# Patient Record
Sex: Female | Born: 1970 | Race: Asian | Hispanic: Yes | Smoking: Never smoker
Health system: Southern US, Community
[De-identification: ages and names within clinical notes are randomized; demographics above are authoritative.]

## PROBLEM LIST (undated history)

## (undated) DIAGNOSIS — M7580 Other shoulder lesions, unspecified shoulder: Secondary | ICD-10-CM

## (undated) DIAGNOSIS — F329 Major depressive disorder, single episode, unspecified: Secondary | ICD-10-CM

## (undated) DIAGNOSIS — G4730 Sleep apnea, unspecified: Secondary | ICD-10-CM

## (undated) DIAGNOSIS — K5090 Crohn's disease, unspecified, without complications: Secondary | ICD-10-CM

## (undated) DIAGNOSIS — F319 Bipolar disorder, unspecified: Secondary | ICD-10-CM

## (undated) DIAGNOSIS — G44009 Cluster headache syndrome, unspecified, not intractable: Secondary | ICD-10-CM

## (undated) DIAGNOSIS — E119 Type 2 diabetes mellitus without complications: Secondary | ICD-10-CM

## (undated) DIAGNOSIS — G7111 Myotonic muscular dystrophy: Secondary | ICD-10-CM

## (undated) DIAGNOSIS — M7520 Bicipital tendinitis, unspecified shoulder: Secondary | ICD-10-CM

## (undated) DIAGNOSIS — F909 Attention-deficit hyperactivity disorder, unspecified type: Secondary | ICD-10-CM

## (undated) DIAGNOSIS — M51369 Other intervertebral disc degeneration, lumbar region without mention of lumbar back pain or lower extremity pain: Secondary | ICD-10-CM

## (undated) DIAGNOSIS — M48061 Spinal stenosis, lumbar region without neurogenic claudication: Secondary | ICD-10-CM

## (undated) DIAGNOSIS — F32A Depression, unspecified: Secondary | ICD-10-CM

## (undated) DIAGNOSIS — Q851 Tuberous sclerosis: Secondary | ICD-10-CM

## (undated) DIAGNOSIS — G8929 Other chronic pain: Secondary | ICD-10-CM

## (undated) DIAGNOSIS — M549 Dorsalgia, unspecified: Secondary | ICD-10-CM

## (undated) DIAGNOSIS — E875 Hyperkalemia: Secondary | ICD-10-CM

## (undated) DIAGNOSIS — E282 Polycystic ovarian syndrome: Secondary | ICD-10-CM

## (undated) DIAGNOSIS — E7800 Pure hypercholesterolemia, unspecified: Secondary | ICD-10-CM

## (undated) DIAGNOSIS — M5136 Other intervertebral disc degeneration, lumbar region: Secondary | ICD-10-CM

## (undated) DIAGNOSIS — I10 Essential (primary) hypertension: Secondary | ICD-10-CM

## (undated) HISTORY — DX: Other chronic pain: G89.29

## (undated) HISTORY — DX: Other shoulder lesions, unspecified shoulder: M75.80

## (undated) HISTORY — DX: Bipolar disorder, unspecified: F31.9

## (undated) HISTORY — DX: Essential (primary) hypertension: I10

## (undated) HISTORY — DX: Polycystic ovarian syndrome: E28.2

## (undated) HISTORY — DX: Other intervertebral disc degeneration, lumbar region: M51.36

## (undated) HISTORY — DX: Other intervertebral disc degeneration, lumbar region without mention of lumbar back pain or lower extremity pain: M51.369

## (undated) HISTORY — DX: Type 2 diabetes mellitus without complications: E11.9

## (undated) HISTORY — DX: Cluster headache syndrome, unspecified, not intractable: G44.009

## (undated) HISTORY — DX: Attention-deficit hyperactivity disorder, unspecified type: F90.9

## (undated) HISTORY — DX: Major depressive disorder, single episode, unspecified: F32.9

## (undated) HISTORY — DX: Sleep apnea, unspecified: G47.30

## (undated) HISTORY — PX: SHOULDER SURGERY: SHX246

## (undated) HISTORY — PX: OTHER SURGICAL HISTORY: SHX169

## (undated) HISTORY — DX: Bicipital tendinitis, unspecified shoulder: M75.20

## (undated) HISTORY — DX: Pure hypercholesterolemia, unspecified: E78.00

## (undated) HISTORY — DX: Depression, unspecified: F32.A

## (undated) HISTORY — DX: Tuberous sclerosis: Q85.1

## (undated) HISTORY — DX: Crohn's disease, unspecified, without complications: K50.90

## (undated) HISTORY — PX: CATARACT EXTRACTION, BILATERAL: SHX1313

## (undated) HISTORY — DX: Dorsalgia, unspecified: M54.9

---

## 2006-06-01 ENCOUNTER — Ambulatory Visit: Payer: Self-pay | Admitting: Internal Medicine

## 2006-06-16 DIAGNOSIS — J301 Allergic rhinitis due to pollen: Secondary | ICD-10-CM | POA: Insufficient documentation

## 2006-06-16 DIAGNOSIS — E782 Mixed hyperlipidemia: Secondary | ICD-10-CM | POA: Insufficient documentation

## 2006-06-16 DIAGNOSIS — M545 Low back pain, unspecified: Secondary | ICD-10-CM | POA: Insufficient documentation

## 2007-09-06 ENCOUNTER — Ambulatory Visit: Payer: Self-pay | Admitting: Pain Medicine

## 2007-09-26 ENCOUNTER — Ambulatory Visit: Payer: Self-pay | Admitting: Pain Medicine

## 2007-09-27 ENCOUNTER — Ambulatory Visit: Payer: Self-pay | Admitting: Pain Medicine

## 2007-11-02 ENCOUNTER — Ambulatory Visit: Payer: Self-pay | Admitting: Pain Medicine

## 2007-11-28 ENCOUNTER — Ambulatory Visit: Payer: Self-pay | Admitting: Pain Medicine

## 2007-12-20 ENCOUNTER — Ambulatory Visit: Payer: Self-pay | Admitting: Pain Medicine

## 2008-01-04 ENCOUNTER — Ambulatory Visit: Payer: Self-pay | Admitting: Pain Medicine

## 2008-02-14 ENCOUNTER — Ambulatory Visit: Payer: Self-pay | Admitting: Pain Medicine

## 2008-02-29 ENCOUNTER — Ambulatory Visit: Payer: Self-pay | Admitting: Pain Medicine

## 2008-03-22 ENCOUNTER — Ambulatory Visit: Payer: Self-pay | Admitting: Pain Medicine

## 2008-04-16 ENCOUNTER — Ambulatory Visit: Payer: Self-pay | Admitting: Pain Medicine

## 2008-05-17 ENCOUNTER — Ambulatory Visit: Payer: Self-pay | Admitting: Pain Medicine

## 2008-06-06 ENCOUNTER — Ambulatory Visit: Payer: Self-pay | Admitting: Pain Medicine

## 2008-08-21 ENCOUNTER — Ambulatory Visit: Payer: Self-pay | Admitting: Pain Medicine

## 2008-09-10 ENCOUNTER — Ambulatory Visit: Payer: Self-pay | Admitting: Pain Medicine

## 2008-10-02 ENCOUNTER — Ambulatory Visit: Payer: Self-pay | Admitting: Pain Medicine

## 2008-10-22 ENCOUNTER — Ambulatory Visit: Payer: Self-pay | Admitting: Pain Medicine

## 2008-12-27 ENCOUNTER — Ambulatory Visit: Payer: Self-pay | Admitting: Pain Medicine

## 2010-09-05 ENCOUNTER — Ambulatory Visit: Payer: Self-pay

## 2010-09-09 ENCOUNTER — Ambulatory Visit: Payer: Self-pay

## 2010-09-10 LAB — PATHOLOGY REPORT

## 2011-01-04 HISTORY — PX: ENDOMETRIAL ABLATION: SHX621

## 2011-01-23 ENCOUNTER — Ambulatory Visit: Payer: Self-pay

## 2011-01-27 ENCOUNTER — Ambulatory Visit: Payer: Self-pay

## 2011-01-28 LAB — PATHOLOGY REPORT

## 2011-06-23 ENCOUNTER — Ambulatory Visit: Payer: Self-pay | Admitting: Pain Medicine

## 2011-07-05 ENCOUNTER — Ambulatory Visit: Payer: Self-pay | Admitting: Pain Medicine

## 2011-07-16 ENCOUNTER — Ambulatory Visit: Payer: Self-pay | Admitting: Pain Medicine

## 2012-02-11 ENCOUNTER — Ambulatory Visit: Payer: Self-pay | Admitting: Pain Medicine

## 2012-03-25 ENCOUNTER — Ambulatory Visit: Payer: Self-pay | Admitting: Internal Medicine

## 2012-04-28 ENCOUNTER — Emergency Department: Payer: Self-pay | Admitting: Emergency Medicine

## 2012-04-28 ENCOUNTER — Ambulatory Visit: Payer: Self-pay | Admitting: Pain Medicine

## 2012-05-26 ENCOUNTER — Ambulatory Visit: Payer: Self-pay | Admitting: Pain Medicine

## 2012-06-27 ENCOUNTER — Ambulatory Visit: Payer: Self-pay | Admitting: Pain Medicine

## 2012-09-13 ENCOUNTER — Ambulatory Visit: Payer: Self-pay | Admitting: Pain Medicine

## 2012-09-29 ENCOUNTER — Ambulatory Visit: Payer: Self-pay | Admitting: Pain Medicine

## 2012-11-29 ENCOUNTER — Ambulatory Visit: Payer: Self-pay | Admitting: Pain Medicine

## 2012-12-28 ENCOUNTER — Ambulatory Visit: Payer: Self-pay | Admitting: Pain Medicine

## 2012-12-29 ENCOUNTER — Ambulatory Visit: Payer: Self-pay | Admitting: Pain Medicine

## 2013-01-26 ENCOUNTER — Ambulatory Visit: Payer: Self-pay | Admitting: Pain Medicine

## 2013-02-23 ENCOUNTER — Ambulatory Visit: Payer: Self-pay | Admitting: Pain Medicine

## 2013-03-29 ENCOUNTER — Ambulatory Visit: Payer: Self-pay | Admitting: Pain Medicine

## 2013-03-31 ENCOUNTER — Ambulatory Visit: Payer: Self-pay | Admitting: Internal Medicine

## 2013-04-27 ENCOUNTER — Ambulatory Visit: Payer: Self-pay | Admitting: Pain Medicine

## 2013-05-22 ENCOUNTER — Ambulatory Visit: Payer: Self-pay | Admitting: Pain Medicine

## 2013-06-22 ENCOUNTER — Ambulatory Visit: Payer: Self-pay | Admitting: Pain Medicine

## 2013-08-08 ENCOUNTER — Ambulatory Visit: Payer: Self-pay | Admitting: Pain Medicine

## 2013-09-05 ENCOUNTER — Ambulatory Visit: Payer: Self-pay | Admitting: Pain Medicine

## 2013-09-07 ENCOUNTER — Emergency Department: Payer: Self-pay | Admitting: Emergency Medicine

## 2013-09-07 LAB — BASIC METABOLIC PANEL
Anion Gap: 6 — ABNORMAL LOW (ref 7–16)
BUN: 21 mg/dL — ABNORMAL HIGH (ref 7–18)
CREATININE: 0.75 mg/dL (ref 0.60–1.30)
Calcium, Total: 9.2 mg/dL (ref 8.5–10.1)
Chloride: 107 mmol/L (ref 98–107)
Co2: 25 mmol/L (ref 21–32)
EGFR (African American): >60
EGFR (Non-African Amer.): >60
Glucose: 109 mg/dL — ABNORMAL HIGH (ref 65–99)
Osmolality: 279 (ref 275–301)
Potassium: 3.6 mmol/L (ref 3.5–5.1)
Sodium: 138 mmol/L (ref 136–145)

## 2013-09-07 LAB — CBC WITH DIFFERENTIAL/PLATELET
BASOS PCT: 0.8 %
Basophil #: 0.1 10*3/uL (ref 0.0–0.1)
EOS ABS: 0.2 10*3/uL (ref 0.0–0.7)
EOS PCT: 3.1 %
HCT: 41 % (ref 35.0–47.0)
HGB: 13.5 g/dL (ref 12.0–16.0)
LYMPHS ABS: 2.1 10*3/uL (ref 1.0–3.6)
LYMPHS PCT: 25.8 %
MCH: 28.7 pg (ref 26.0–34.0)
MCHC: 32.9 g/dL (ref 32.0–36.0)
MCV: 87 fL (ref 80–100)
Monocyte #: 0.8 x10 3/mm (ref 0.2–0.9)
Monocyte %: 9.5 %
NEUTROS PCT: 60.8 %
Neutrophil #: 4.9 10*3/uL (ref 1.4–6.5)
Platelet: 272 10*3/uL (ref 150–440)
RBC: 4.7 10*6/uL (ref 3.80–5.20)
RDW: 15.8 % — AB (ref 11.5–14.5)
WBC: 8 10*3/uL (ref 3.6–11.0)

## 2013-09-07 LAB — TROPONIN I

## 2013-09-28 ENCOUNTER — Ambulatory Visit: Payer: Self-pay | Admitting: Pain Medicine

## 2013-10-05 DIAGNOSIS — F411 Generalized anxiety disorder: Secondary | ICD-10-CM | POA: Insufficient documentation

## 2013-10-05 DIAGNOSIS — M25819 Other specified joint disorders, unspecified shoulder: Secondary | ICD-10-CM | POA: Insufficient documentation

## 2013-10-05 DIAGNOSIS — R5381 Other malaise: Secondary | ICD-10-CM | POA: Insufficient documentation

## 2013-10-05 DIAGNOSIS — M7580 Other shoulder lesions, unspecified shoulder: Secondary | ICD-10-CM

## 2013-10-25 ENCOUNTER — Ambulatory Visit: Payer: Self-pay | Admitting: Pain Medicine

## 2013-11-08 ENCOUNTER — Ambulatory Visit: Payer: Self-pay | Admitting: Pain Medicine

## 2013-12-05 ENCOUNTER — Ambulatory Visit: Payer: Self-pay | Admitting: Pain Medicine

## 2014-01-02 ENCOUNTER — Ambulatory Visit: Payer: Self-pay | Admitting: Pain Medicine

## 2014-01-31 ENCOUNTER — Ambulatory Visit: Payer: Self-pay | Admitting: Pain Medicine

## 2014-03-01 ENCOUNTER — Ambulatory Visit: Payer: Self-pay | Admitting: Pain Medicine

## 2014-04-03 ENCOUNTER — Ambulatory Visit: Payer: Self-pay | Admitting: Pain Medicine

## 2014-05-03 ENCOUNTER — Ambulatory Visit: Payer: Self-pay | Admitting: Pain Medicine

## 2014-06-04 ENCOUNTER — Ambulatory Visit: Payer: Self-pay | Admitting: Pain Medicine

## 2014-07-10 ENCOUNTER — Ambulatory Visit: Payer: Self-pay | Admitting: Pain Medicine

## 2014-08-06 ENCOUNTER — Ambulatory Visit: Payer: Self-pay | Admitting: Pain Medicine

## 2014-09-11 ENCOUNTER — Ambulatory Visit: Payer: Self-pay | Admitting: Pain Medicine

## 2014-10-08 ENCOUNTER — Ambulatory Visit
Admit: 2014-10-08 | Disposition: A | Discharge status: Home / Self Care | Payer: Self-pay | Attending: Pain Medicine | Admitting: Pain Medicine

## 2014-11-06 ENCOUNTER — Ambulatory Visit: Attending: Pain Medicine | Admitting: Pain Medicine

## 2014-11-06 ENCOUNTER — Encounter (INDEPENDENT_AMBULATORY_CARE_PROVIDER_SITE_OTHER): Payer: Self-pay

## 2014-11-06 ENCOUNTER — Encounter: Payer: Self-pay | Admitting: Pain Medicine

## 2014-11-06 VITALS — BP 122/80 | HR 97 | Temp 98.2°F | Resp 19 | Ht 68.0 in | Wt 238.0 lb

## 2014-11-06 DIAGNOSIS — M5030 Other cervical disc degeneration, unspecified cervical region: Secondary | ICD-10-CM

## 2014-11-06 DIAGNOSIS — M47892 Other spondylosis, cervical region: Secondary | ICD-10-CM | POA: Diagnosis not present

## 2014-11-06 DIAGNOSIS — S43429A Sprain of unspecified rotator cuff capsule, initial encounter: Secondary | ICD-10-CM | POA: Insufficient documentation

## 2014-11-06 DIAGNOSIS — M62838 Other muscle spasm: Secondary | ICD-10-CM | POA: Diagnosis not present

## 2014-11-06 DIAGNOSIS — M25511 Pain in right shoulder: Secondary | ICD-10-CM | POA: Insufficient documentation

## 2014-11-06 DIAGNOSIS — M5412 Radiculopathy, cervical region: Secondary | ICD-10-CM

## 2014-11-06 DIAGNOSIS — M25512 Pain in left shoulder: Secondary | ICD-10-CM | POA: Diagnosis not present

## 2014-11-06 DIAGNOSIS — S43421D Sprain of right rotator cuff capsule, subsequent encounter: Secondary | ICD-10-CM

## 2014-11-06 DIAGNOSIS — M542 Cervicalgia: Secondary | ICD-10-CM | POA: Diagnosis present

## 2014-11-06 DIAGNOSIS — M47817 Spondylosis without myelopathy or radiculopathy, lumbosacral region: Secondary | ICD-10-CM

## 2014-11-06 DIAGNOSIS — R51 Headache: Secondary | ICD-10-CM | POA: Diagnosis present

## 2014-11-06 MED ORDER — TAPENTADOL HCL 50 MG PO TABS: ORAL_TABLET | ORAL | Status: DC

## 2014-11-06 MED ORDER — PREGABALIN 150 MG PO CAPS: ORAL_CAPSULE | ORAL | Status: DC

## 2014-11-06 MED ORDER — ORPHENADRINE CITRATE ER 100 MG PO TB12: ORAL_TABLET | ORAL | Status: DC

## 2014-11-06 NOTE — Progress Notes (Signed)
   Subjective:    Patient ID: Vickie Stevens, female    DOB: [birth year], 44 y.o.   MRN: [id number]  HPI  Patient is a 44 year old female returns to pain medicine for further evaluation treatment of pain involving the upper extremities especially the shoulders and thoracic lumbar region. Patient states the pain of the cervical region radiates to behave precipitating headaches patient denies any recent trauma or change in events or activities of daily living to cause change in symptomatology We will avoid interventional treatment and will continue patient's present medications. Patient is understanding and agreement with suggested treatment plan  Review of Systems     Objective:   Physical Exam  There was tense to palpation paraspinal muscular region cervical region cervical facet region. Tenderness of his pain is To eat and a separate musculature regions especially on the left. Severe tenderness of the acromioclavicular glenohumeral joint region with severely limited range of motion of the left shoulder and with severe limitation of range of motion of the right shoulder as well patient was with decreased grip strength bilaterally as well there was tenderness of the thoracic facet thoracic paraspinal musculature region with evidence of muscle spasms without crepitus of the thoracic region.. It was tenderness of the lumbar paraspinal most recent lumbar facet region of moderate degree as well. There was tenderness of the PSIS and PS regions of moderately severe degree as well leg raising was tolerated to approximately 20 without increased pain with dorsiflexion without definite sensory deficit of dermatomal distribution detected EHL strength appeared to be decreased. There was moderate tenderness of the greater trochanteric region area abdomen was soft nontender without costovertebral angle tenderness noted.      Assessment & Plan:  Assessment patient with pain of the cervical region with degenerative  changes of the cervical spine with what appears to be component of Thick-It colitis with severe degenerative joint disease of the shoulders. Patient with evidence of rotator cuff abnormalities with prior surgery of the shoulder with severely limited range of motion and use of the upper extremities. Patient with lumbar showed a pain felt to be with component of facet syndrome sacroiliac joint dysfunction and severe muscle spasms at present time patient appears to be significantly limited in terms of her functional capacity with severely limited range of motion of the left upper extremity especially and with significant weakness of the left upper extremity with intermittent inability to perform activities of daily living due to severely incapacitating upper extremity lack of range of motion and weakness of the upper extremities  Plan #1 medications will continue Lyrica Norflex and nucynta Plan #2 patient is to follow-up with Dr. Yancey Flemings primary care physician Plan #3 patient is to follow up with Dr. Armando Reichert [place] for evaluation of general medical condition Plan #4 patient is to undergo neurosurgical evaluation of cervical and upper extremity pain paresthesias and weakness and evaluation of lumbar lower extremity pain as well Plan #5 patient is to undergo neurological evaluation of cervical and upper extremity pain paresthesias and weakness and lumbar and lower extremity pain paresthesias and weakness Plan #6 may consider radiofrequency procedures and intraspinal implantation procedures pending response to treatment and follow-up evaluation Patient is to call pain management should there be change in condition other concerns regarding this prior to scheduled return appointment

## 2014-11-06 NOTE — Patient Instructions (Addendum)
Pt is to f/u with surgeon and neurologist as discussed. Prescription for Nucynta and Lyrica E-script for Norflex

## 2014-11-07 NOTE — Addendum Note (Signed)
Addended by: Morley Kos on: 11/07/2014 04:13 PM   Modules accepted: Level of Service

## 2014-11-15 NOTE — Addendum Note (Signed)
Addended by: Dewayne Shorter on: 11/15/2014 10:47 AM   Modules accepted: Orders

## 2014-11-20 ENCOUNTER — Other Ambulatory Visit: Payer: Self-pay | Admitting: Obstetrics and Gynecology

## 2014-11-20 DIAGNOSIS — Z1231 Encounter for screening mammogram for malignant neoplasm of breast: Secondary | ICD-10-CM

## 2014-12-03 ENCOUNTER — Other Ambulatory Visit: Payer: Self-pay | Admitting: Pain Medicine

## 2014-12-03 DIAGNOSIS — M5481 Occipital neuralgia: Secondary | ICD-10-CM

## 2014-12-05 ENCOUNTER — Ambulatory Visit: Attending: Pain Medicine | Admitting: Pain Medicine

## 2014-12-05 ENCOUNTER — Ambulatory Visit: Payer: Self-pay

## 2014-12-05 VITALS — BP 115/78 | HR 89 | Temp 97.9°F | Resp 15 | Ht 68.0 in | Wt 240.0 lb

## 2014-12-05 DIAGNOSIS — Z9889 Other specified postprocedural states: Secondary | ICD-10-CM | POA: Insufficient documentation

## 2014-12-05 DIAGNOSIS — M542 Cervicalgia: Secondary | ICD-10-CM | POA: Diagnosis present

## 2014-12-05 DIAGNOSIS — M5481 Occipital neuralgia: Secondary | ICD-10-CM

## 2014-12-05 DIAGNOSIS — M75100 Unspecified rotator cuff tear or rupture of unspecified shoulder, not specified as traumatic: Secondary | ICD-10-CM | POA: Insufficient documentation

## 2014-12-05 DIAGNOSIS — M5030 Other cervical disc degeneration, unspecified cervical region: Secondary | ICD-10-CM | POA: Insufficient documentation

## 2014-12-05 DIAGNOSIS — M47817 Spondylosis without myelopathy or radiculopathy, lumbosacral region: Secondary | ICD-10-CM

## 2014-12-05 DIAGNOSIS — M19011 Primary osteoarthritis, right shoulder: Secondary | ICD-10-CM | POA: Diagnosis not present

## 2014-12-05 DIAGNOSIS — S43429D Sprain of unspecified rotator cuff capsule, subsequent encounter: Secondary | ICD-10-CM

## 2014-12-05 DIAGNOSIS — M533 Sacrococcygeal disorders, not elsewhere classified: Secondary | ICD-10-CM | POA: Insufficient documentation

## 2014-12-05 DIAGNOSIS — X58XXXA Exposure to other specified factors, initial encounter: Secondary | ICD-10-CM | POA: Insufficient documentation

## 2014-12-05 DIAGNOSIS — M546 Pain in thoracic spine: Secondary | ICD-10-CM | POA: Diagnosis present

## 2014-12-05 DIAGNOSIS — M19012 Primary osteoarthritis, left shoulder: Secondary | ICD-10-CM | POA: Diagnosis not present

## 2014-12-05 DIAGNOSIS — M5412 Radiculopathy, cervical region: Secondary | ICD-10-CM

## 2014-12-05 DIAGNOSIS — M7060 Trochanteric bursitis, unspecified hip: Secondary | ICD-10-CM | POA: Insufficient documentation

## 2014-12-05 DIAGNOSIS — M75101 Unspecified rotator cuff tear or rupture of right shoulder, not specified as traumatic: Secondary | ICD-10-CM

## 2014-12-05 MED ORDER — TAPENTADOL HCL 50 MG PO TABS: ORAL_TABLET | ORAL | Status: DC

## 2014-12-05 MED ORDER — ORPHENADRINE CITRATE ER 100 MG PO TB12: ORAL_TABLET | ORAL | Status: DC

## 2014-12-05 MED ORDER — PREGABALIN 150 MG PO CAPS: ORAL_CAPSULE | ORAL | Status: DC

## 2014-12-05 NOTE — Progress Notes (Signed)
   Subjective:    Patient ID: Vickie Stevens, female    DOB: 04-Mar-1971, 44 y.o.   MRN: [id number]  HPI    Patient is 44 year old female returns to [place] for further evaluation and treatment of pain involving the region of the neck and entire back upper and lower extremity regions. Patient has had a rather complex time involving her treatment with difficulty obtaining referrals for evaluation of her condition the present time we'll continue presently prescribed medications as discussed with patient. Patient will also follow-up with Dr. Felipe Drone discussed medications and treatment with Dr. Rosine Door will follow-up with primary care physician for further evaluation of her general medical condition as well as to address referrals. The patient was understanding and in agreement with suggested treatment plan a should also stated that she is having difficulty with her employer and her benefits. Patient has obtained an attorney to assist with this matter. We will continue medications at this time and consider modification of medications and other expressive patient's treatment regimen pending further assessment and follow-up evaluations.     Review of Systems     Objective:   Physical Exam There was tennis of the spleen scapula and occipitalis regions of moderate to moderately severe degree with severe tenderness of the acromioclavicular and glenohumeral joint regions. There was severely limited range of motion of the shoulders on the left as well as on the right and patient was unable to perform drop test. Tinel and Phalen's maneuver associated with moderate discomfort and patient was with decreased grip strength. Palpation over the thoracic facet thoracic paraspinal musculature region reproduced pain of mild mild to moderate degree. No crepitus of the thoracic region was noted. Palpation over the lumbar paraspinal musculature region lumbar facet region was associated tends to palpation of  moderate degree with lateral bending and rotation extension and palpation of the lumbar facets reproducing moderate discomfort. There was moderate tends of the greater trochanteric region iliotibial band region as well. Straight leg raising was tolerates about 30 without an increase of pain with dorsiflexion noted. There was negative clonus negative Homans. Abdomen nontender and no costovertebral angle tenderness was noted.       Assessment & Plan:     Degenerative joint disease of shoulders  Rotator cuff tear of shoulder Status post surgical intervention of the shoulder  Degenerative disc disease cervical spine  Greater occipital neuralgia  Sacroiliitis sacroiliac joint dysfunction  Greater trochanteric bursitis    Plan    Continue present medications.  F/U PCP for evaliation of  BP and general medical  condition.  F/U surgical evaluation.  Appointment with Dr. Rosine Door to discuss medications and general condition  F/U neurological evaluation.  May consider radiofrequency rhizolysis or intraspinal procedures pending response to present treatment and F/U evaluation.  Patient to call Pain Management Center should patient have concerns prior to scheduled return appointment.

## 2014-12-05 NOTE — Progress Notes (Signed)
   Subjective:    Patient ID: Vickie Stevens, female    DOB: May 31, 1971, 44 y.o.   MRN: [id number]  HPI    Review of Systems     Objective:   Physical Exam        Assessment & Plan:

## 2014-12-05 NOTE — Patient Instructions (Addendum)
Continue present medications. F/u DR Rosine Door as discussed for discussion of medications and general condition as discussed   F/U PCP for evaliation of  BP and general medical  condition.  F/U surgical evaluation.  F/U neurological evaluation.. Please follow-up with neurologist for evaluation of neurological condition as discussed.  May consider radiofrequency rhizolysis or intraspinal procedures pending response to present treatment and F/U evaluation.  Patient to call Pain Management Center should patient have concerns prior to scheduled return appointment.

## 2014-12-05 NOTE — Progress Notes (Signed)
Safety precautions to be maintained throughout the outpatient stay will include: orient to surroundings, keep bed in low position, maintain call bell within reach at all times, provide assistance with transfer out of bed and ambulation.  

## 2014-12-05 NOTE — Progress Notes (Signed)
Safety precautions to be maintained throughout the outpatient stay will include: orient to surroundings, keep bed in low position, maintain call bell within reach at all times, provide assistance with transfer out of bed and ambulation.  Discharged at 1345, ambulatory.

## 2014-12-11 ENCOUNTER — Other Ambulatory Visit: Payer: Self-pay | Admitting: Pain Medicine

## 2014-12-11 DIAGNOSIS — M5030 Other cervical disc degeneration, unspecified cervical region: Secondary | ICD-10-CM

## 2014-12-11 DIAGNOSIS — S46002S Unspecified injury of muscle(s) and tendon(s) of the rotator cuff of left shoulder, sequela: Secondary | ICD-10-CM

## 2014-12-11 DIAGNOSIS — M533 Sacrococcygeal disorders, not elsewhere classified: Secondary | ICD-10-CM

## 2014-12-11 DIAGNOSIS — S46001S Unspecified injury of muscle(s) and tendon(s) of the rotator cuff of right shoulder, sequela: Secondary | ICD-10-CM

## 2014-12-14 ENCOUNTER — Ambulatory Visit: Payer: Self-pay

## 2014-12-17 ENCOUNTER — Ambulatory Visit
Admission: RE | Admit: 2014-12-17 | Discharge: 2014-12-17 | Disposition: A | Discharge status: Home / Self Care | Source: Ambulatory Visit | Attending: Obstetrics and Gynecology | Admitting: Obstetrics and Gynecology

## 2014-12-17 DIAGNOSIS — Z1231 Encounter for screening mammogram for malignant neoplasm of breast: Secondary | ICD-10-CM | POA: Insufficient documentation

## 2014-12-20 ENCOUNTER — Other Ambulatory Visit: Payer: Self-pay | Admitting: Obstetrics and Gynecology

## 2014-12-20 DIAGNOSIS — R928 Other abnormal and inconclusive findings on diagnostic imaging of breast: Secondary | ICD-10-CM

## 2014-12-24 ENCOUNTER — Ambulatory Visit: Payer: Self-pay | Admitting: Pain Medicine

## 2014-12-31 ENCOUNTER — Encounter: Payer: Self-pay | Admitting: Pain Medicine

## 2014-12-31 ENCOUNTER — Ambulatory Visit: Admitting: Pain Medicine

## 2014-12-31 VITALS — BP 118/83 | HR 90 | Temp 98.3°F | Resp 16 | Ht 68.0 in | Wt 235.0 lb

## 2014-12-31 DIAGNOSIS — M25511 Pain in right shoulder: Secondary | ICD-10-CM | POA: Diagnosis not present

## 2014-12-31 DIAGNOSIS — N6002 Solitary cyst of left breast: Secondary | ICD-10-CM | POA: Diagnosis not present

## 2014-12-31 DIAGNOSIS — S43429D Sprain of unspecified rotator cuff capsule, subsequent encounter: Secondary | ICD-10-CM

## 2014-12-31 DIAGNOSIS — M19019 Primary osteoarthritis, unspecified shoulder: Secondary | ICD-10-CM | POA: Insufficient documentation

## 2014-12-31 DIAGNOSIS — M47817 Spondylosis without myelopathy or radiculopathy, lumbosacral region: Secondary | ICD-10-CM

## 2014-12-31 DIAGNOSIS — M19112 Post-traumatic osteoarthritis, left shoulder: Secondary | ICD-10-CM

## 2014-12-31 DIAGNOSIS — M5412 Radiculopathy, cervical region: Secondary | ICD-10-CM

## 2014-12-31 DIAGNOSIS — N6001 Solitary cyst of right breast: Secondary | ICD-10-CM | POA: Diagnosis not present

## 2014-12-31 DIAGNOSIS — M19111 Post-traumatic osteoarthritis, right shoulder: Secondary | ICD-10-CM

## 2014-12-31 DIAGNOSIS — R928 Other abnormal and inconclusive findings on diagnostic imaging of breast: Secondary | ICD-10-CM | POA: Diagnosis present

## 2014-12-31 DIAGNOSIS — M25512 Pain in left shoulder: Secondary | ICD-10-CM | POA: Diagnosis not present

## 2014-12-31 DIAGNOSIS — M542 Cervicalgia: Secondary | ICD-10-CM | POA: Diagnosis not present

## 2014-12-31 DIAGNOSIS — M5030 Other cervical disc degeneration, unspecified cervical region: Secondary | ICD-10-CM

## 2014-12-31 DIAGNOSIS — M5481 Occipital neuralgia: Secondary | ICD-10-CM

## 2014-12-31 DIAGNOSIS — R51 Headache: Secondary | ICD-10-CM | POA: Diagnosis not present

## 2014-12-31 DIAGNOSIS — M75100 Unspecified rotator cuff tear or rupture of unspecified shoulder, not specified as traumatic: Secondary | ICD-10-CM

## 2014-12-31 MED ORDER — BUPIVACAINE HCL (PF) 0.25 % IJ SOLN
INTRAMUSCULAR | Status: AC
  Administered 2014-12-31: 12:00:00
  Filled 2014-12-31: qty 30

## 2014-12-31 MED ORDER — METHYLPREDNISOLONE ACETATE 40 MG/ML IJ SUSP
INTRAMUSCULAR | Status: AC
  Administered 2014-12-31: 12:00:00
  Filled 2014-12-31: qty 1

## 2014-12-31 NOTE — Patient Instructions (Addendum)
Continue present medications including Nucynta, Lyrica, and Norflex  Greater occipital nerve block for treatment of headache and pain of the cervical and thoracic area to be performed pending insurance approval  F/U PCP for evaliation of  BP and general medical  condition.  F/U surgical evaluation.  F/U neurological evaluation.  May consider radiofrequency rhizolysis or intraspinal procedures pending response to present treatment and F/U evaluation.  Patient to call Pain Management Center should patient have concerns prior to scheduled return appointment. Pain Management Discharge Instructions  General Discharge Instructions :  If you need to reach your doctor call: Monday-Friday 8:00 am - 4:00 pm at [phone] or toll free [phone].  After clinic hours [phone] to have operator reach doctor.  Bring all of your medication bottles to all your appointments in the pain clinic.  To cancel or reschedule your appointment with Pain Management please remember to call 24 hours in advance to avoid a fee.  Refer to the educational materials which you have been given on: General Risks, I had my Procedure. Discharge Instructions, Post Sedation.  Post Procedure Instructions:  The drugs you were given will stay in your system until tomorrow, so for the next 24 hours you should not drive, make any legal decisions or drink any alcoholic beverages.  You may eat anything you prefer, but it is better to start with liquids then soups and crackers, and gradually work up to solid foods.  Please notify your doctor immediately if you have any unusual bleeding, trouble breathing or pain that is not related to your normal pain.  Depending on the type of procedure that was done, some parts of your body may feel week and/or numb.  This usually clears up by tonight or the next day.  Walk with the use of an assistive device or accompanied by an adult for the 24 hours.  You may use ice on the  affected area for the first 24 hours.  Put ice in a Ziploc bag and cover with a towel and place against area 15 minutes on 15 minutes off.  You may switch to heat after 24 hours.

## 2014-12-31 NOTE — Progress Notes (Signed)
   Subjective:    Patient ID: Vickie Stevens, female    DOB: 1971/02/27, 44 y.o.   MRN: [id number]  HPI   LEFT AND RIGHT SHOULDER INJECTIONS  The patient is 44 year old female who returns to [place] for further evaluation and treatment of severe pain involving the region of the shoulders with pain involving the neck associated with headaches as well as lower back and lower extremity pain. The patient is status post on-the-job injury and is with prior surgical intervention of the left shoulder with rotator cuff injury and other abnormalities haven't been noted in the region of the left shoulder. Patient also is with evidence of rotator cuff tears of the right shoulder without prior surgery of the right shoulder. The patient is with severely disabling pain involving both the left and right shoulders. There is concern regarding intra-articular abnormalities felt to be contributing to patient's symptomatology. The risks benefits and expectations of the procedure were discussed and explained the patient who was understanding and wishes to proceed with interventional treatment consisting of left and right shoulder injections in an attempt to decrease severity of symptoms, minimize progression of symptoms, and hopefully avoid the need for more involved treatment. The patient was understanding and in agreement with suggested treatment plan.        DESCRIPTION OF PROCEDURE:   Patient in sitting position with EKG, blood pressure, pulse, and pulse oximetry monitoring all in place. Of the left and right shoulders was accomplished  RIGHT SHOULDER INJECTION Following Betadine prep of the proposed entry site 22-gauge needle was inserted in the posterior aspect of the right shoulder and a total of 2 cc of quarter percent bupivacaine with Depo-Medrol was injected for right shoulder injection posterior approach   following Betadine prep of proposed entry site a 22-gauge needle was inserted in the  anterior aspect of the right shoulder and a total of 2 cc of quarter percent bupivacaine with Depo-Medrol was injected for right shoulder injection anterior approach   LEFT SHOULDER INJECTION: The left shoulder was injected anteriorly and posteriorly in the same manner in which the right shoulder was injected and utilizing the same technique and medications.  The patient tolerated the procedure well.  A total of 40 mg of Depo-Medrol was utilized for the procedure   Plan   Continue present medications. Lyrica, Norflex, and Nucynta  F/U PCP for evaliation of  BP and general medical  condition.  F/U surgical evaluation.  F/U Dr. Rosine Door this month for psych follow-up evaluation as discussed  F/U neurological evaluation.  May consider radiofrequency rhizolysis or intraspinal procedures pending response to present treatment and F/U evaluation.  Patient to call Pain Management Center should patient have concerns prior to scheduled return appointment.      Review of Systems     Objective:   Physical Exam        Assessment & Plan:

## 2014-12-31 NOTE — Progress Notes (Signed)
Safety precautions to be maintained throughout the outpatient stay will include: orient to surroundings, keep bed in low position, maintain call bell within reach at all times, provide assistance with transfer out of bed and ambulation.  Patient request new tens unit order,order for medical recliner for sleep, MRI of  Shoulders. And  right hand eval after recent fall. Will discuss with Dr. Primus Bravo a next visit.------J Tamala Julian RN

## 2015-01-01 ENCOUNTER — Ambulatory Visit
Admission: RE | Admit: 2015-01-01 | Discharge: 2015-01-01 | Disposition: A | Discharge status: Home / Self Care | Source: Ambulatory Visit | Attending: Obstetrics and Gynecology | Admitting: Obstetrics and Gynecology

## 2015-01-01 ENCOUNTER — Telehealth: Payer: Self-pay | Admitting: *Deleted

## 2015-01-01 DIAGNOSIS — N6001 Solitary cyst of right breast: Secondary | ICD-10-CM | POA: Insufficient documentation

## 2015-01-01 DIAGNOSIS — M542 Cervicalgia: Secondary | ICD-10-CM | POA: Insufficient documentation

## 2015-01-01 DIAGNOSIS — R928 Other abnormal and inconclusive findings on diagnostic imaging of breast: Secondary | ICD-10-CM

## 2015-01-01 DIAGNOSIS — R51 Headache: Secondary | ICD-10-CM | POA: Insufficient documentation

## 2015-01-01 DIAGNOSIS — M25511 Pain in right shoulder: Secondary | ICD-10-CM | POA: Insufficient documentation

## 2015-01-01 DIAGNOSIS — N6002 Solitary cyst of left breast: Secondary | ICD-10-CM | POA: Insufficient documentation

## 2015-01-01 DIAGNOSIS — M25512 Pain in left shoulder: Secondary | ICD-10-CM | POA: Insufficient documentation

## 2015-01-01 NOTE — Telephone Encounter (Signed)
deniesany complications post procedure

## 2015-01-03 ENCOUNTER — Ambulatory Visit: Attending: Pain Medicine | Admitting: Pain Medicine

## 2015-01-03 ENCOUNTER — Other Ambulatory Visit: Payer: Self-pay | Admitting: Obstetrics and Gynecology

## 2015-01-03 ENCOUNTER — Encounter: Payer: Self-pay | Admitting: Pain Medicine

## 2015-01-03 VITALS — BP 133/68 | HR 92 | Temp 97.6°F | Resp 16 | Ht 69.0 in | Wt 230.0 lb

## 2015-01-03 DIAGNOSIS — M5412 Radiculopathy, cervical region: Secondary | ICD-10-CM

## 2015-01-03 DIAGNOSIS — S46002A Unspecified injury of muscle(s) and tendon(s) of the rotator cuff of left shoulder, initial encounter: Secondary | ICD-10-CM | POA: Diagnosis not present

## 2015-01-03 DIAGNOSIS — M533 Sacrococcygeal disorders, not elsewhere classified: Secondary | ICD-10-CM | POA: Diagnosis not present

## 2015-01-03 DIAGNOSIS — R51 Headache: Secondary | ICD-10-CM | POA: Insufficient documentation

## 2015-01-03 DIAGNOSIS — S46001A Unspecified injury of muscle(s) and tendon(s) of the rotator cuff of right shoulder, initial encounter: Secondary | ICD-10-CM | POA: Diagnosis not present

## 2015-01-03 DIAGNOSIS — M75100 Unspecified rotator cuff tear or rupture of unspecified shoulder, not specified as traumatic: Secondary | ICD-10-CM

## 2015-01-03 DIAGNOSIS — M5481 Occipital neuralgia: Secondary | ICD-10-CM | POA: Diagnosis not present

## 2015-01-03 DIAGNOSIS — M25512 Pain in left shoulder: Secondary | ICD-10-CM | POA: Diagnosis present

## 2015-01-03 DIAGNOSIS — N6001 Solitary cyst of right breast: Secondary | ICD-10-CM

## 2015-01-03 DIAGNOSIS — M25511 Pain in right shoulder: Secondary | ICD-10-CM | POA: Diagnosis present

## 2015-01-03 DIAGNOSIS — M5030 Other cervical disc degeneration, unspecified cervical region: Secondary | ICD-10-CM

## 2015-01-03 DIAGNOSIS — Z9889 Other specified postprocedural states: Secondary | ICD-10-CM | POA: Insufficient documentation

## 2015-01-03 DIAGNOSIS — S43429D Sprain of unspecified rotator cuff capsule, subsequent encounter: Secondary | ICD-10-CM

## 2015-01-03 DIAGNOSIS — M47817 Spondylosis without myelopathy or radiculopathy, lumbosacral region: Secondary | ICD-10-CM

## 2015-01-03 DIAGNOSIS — R921 Mammographic calcification found on diagnostic imaging of breast: Secondary | ICD-10-CM

## 2015-01-03 DIAGNOSIS — N6002 Solitary cyst of left breast: Secondary | ICD-10-CM

## 2015-01-03 DIAGNOSIS — X58XXXA Exposure to other specified factors, initial encounter: Secondary | ICD-10-CM | POA: Insufficient documentation

## 2015-01-03 DIAGNOSIS — M19119 Post-traumatic osteoarthritis, unspecified shoulder: Secondary | ICD-10-CM

## 2015-01-03 MED ORDER — TAPENTADOL HCL 50 MG PO TABS: ORAL_TABLET | ORAL | Status: DC

## 2015-01-03 MED ORDER — PREGABALIN 150 MG PO CAPS: ORAL_CAPSULE | ORAL | Status: DC

## 2015-01-03 MED ORDER — ORPHENADRINE CITRATE ER 100 MG PO TB12: ORAL_TABLET | ORAL | Status: DC

## 2015-01-03 NOTE — Patient Instructions (Addendum)
Continue present medications Norflex Lyrica and Nucynta  Greater occipital nerve block to be performed at time of return appointment  TENS unit. Please go to physical therapy to obtain TENS unit and to be instructed on use of TENS unit  Physical therapy has been prescribed for you for gentle range of motion, stretching, strengthening, massage, and other treatment  F/U PCP for evaliation of  BP and general medical  Condition.  F/U psych eval with Dr. Rosine Door as discussed  F/U surgical evaluation  F/U neurological evaluation  May consider radiofrequency rhizolysis or intraspinal procedures pending response to present treatment and F/U evaluation.  Patient to call Pain Management Center should patient have concerns prior to scheduled return appointment. Pain Management Discharge Instructions  General Discharge Instructions :  If you need to reach your doctor call: Monday-Friday 8:00 am - 4:00 pm at [phone] or toll free [phone].  After clinic hours [phone] to have operator reach doctor.  Bring all of your medication bottles to all your appointments in the pain clinic.  To cancel or reschedule your appointment with Pain Management please remember to call 24 hours in advance to avoid a fee.  Refer to the educational materials which you have been given on: General Risks, I had my Procedure. Discharge Instructions, Post Sedation.  Post Procedure Instructions:  The drugs you were given will stay in your system until tomorrow, so for the next 24 hours you should not drive, make any legal decisions or drink any alcoholic beverages.  You may eat anything you prefer, but it is better to start with liquids then soups and crackers, and gradually work up to solid foods.  Please notify your doctor immediately if you have any unusual bleeding, trouble breathing or pain that is not related to your normal pain.  Depending on the type of procedure that was done, some parts of your  body may feel week and/or numb.  This usually clears up by tonight or the next day.  Walk with the use of an assistive device or accompanied by an adult for the 24 hours.  You may use ice on the affected area for the first 24 hours.  Put ice in a Ziploc bag and cover with a towel and place against area 15 minutes on 15 minutes off.  You may switch to heat after 24 hours.

## 2015-01-03 NOTE — Progress Notes (Signed)
   Subjective:    Patient ID: Vickie Stevens, female    DOB: [birth year], 44 y.o.   MRN: [id number]  HPI  Patient is 44 year old female returns to [place] for further evaluation and treatment of pain involving the shoulders headache upper extremity region with lower back lower extremity pain of lesser degree. Patient states that her pain of neck radiates to the back of the head causing headaches. Patient is severely restricted range of motion of the shoulders which is contributed to significant muscle spasms occurring in the trapezius and splenius capitate and occipitalis musculature regions contributing to severe muscle spasms which precipitate headaches felt to be component of greater occipital neuralgia we discussed patient's condition and will consider patient for greater occipital nerve blocks at time return appointment in attempt to decrease severity of symptoms, minimize progression of symptoms, and and avoid the need for more involved treatment. The patient was understanding and in agreement to treatment plan. Patient denied any recent trauma change in events of daily living the call significant changes of the pathology.     Review of Systems     Objective:   Physical Exam  Tends to palpation paraspinal musculature region cervical region cervical facet region moderate degree. There was severe tends to palpation of the splenius capitis and occipitalis musculature region. It appeared to be unremarkable Spurling's maneuver. Patient unable to perform drop test was severely restricted range of motion of the shoulders on the left as well as on the right with inability to abduct.the extremities to 60. There was decreased grip strength. Tinel and Phalen's maneuver were without increase of pain significant degree. Palpation over the thoracic facet thoracic paraspinal musculature region was with moderate tends to palpation without crepitus of the thoracic region noted. Palpation over the  lumbar paraspinal muscles region lumbar facet region was tends to palpation of mild to moderate degree. Lateral bending and rotation extension and palpation of the lumbar facets reproduce moderate discomfort. Straight leg raising tolerated possible 30 without increased pain with dorsiflexion noted. Negative clonus negative Homans. No crepitus patient difficulty relaxing. Degenerative dis      Assessment & Plan:  Rotator cuff injury of left and right shoulders Status post surgery of the left shoulder Severely decreased range of motion of the left and right shoulders with concern regarding adhesive capsulitis  Greater occipital neuralgia  Cervicogenic headache  Sacroiliac joint dysfunction    Plan   Continue present medications Norflex and AndNucynta    F/U PCP for evaliation of  BP and general medical  condition.  F/U surgical evaluation  F/U Dr. Rosine Door for psych follow-up evaluation as discussed  F/U neurological evaluation  Physical therapy prescribe patient for range of motion (gentle range of motion) maneuvers, stretching, strengthening, massage and other treatment caution to avoid aggravation of patient's symptomatology  TENS unit prescribed for patient with severe pain of the cervical upper extremity regions as well as the lumbar lower extremity regions  May consider radiofrequency rhizolysis or intraspinal procedures pending response to present treatment and F/U evaluation.  Patient to call Pain Management Center should patient have concerns prior to scheduled return appointment.

## 2015-01-03 NOTE — Progress Notes (Signed)
Safety precautions to be maintained throughout the outpatient stay will include: orient to surroundings, keep bed in low position, maintain call bell within reach at all times, provide assistance with transfer out of bed and ambulation.  

## 2015-01-30 ENCOUNTER — Encounter: Payer: Self-pay | Admitting: Pain Medicine

## 2015-01-30 ENCOUNTER — Ambulatory Visit: Attending: Pain Medicine | Admitting: Pain Medicine

## 2015-01-30 VITALS — BP 124/90 | HR 94 | Temp 97.5°F | Resp 15 | Ht 69.0 in | Wt 222.0 lb

## 2015-01-30 DIAGNOSIS — M79601 Pain in right arm: Secondary | ICD-10-CM | POA: Diagnosis present

## 2015-01-30 DIAGNOSIS — Z9889 Other specified postprocedural states: Secondary | ICD-10-CM | POA: Diagnosis not present

## 2015-01-30 DIAGNOSIS — M19012 Primary osteoarthritis, left shoulder: Secondary | ICD-10-CM | POA: Insufficient documentation

## 2015-01-30 DIAGNOSIS — M75100 Unspecified rotator cuff tear or rupture of unspecified shoulder, not specified as traumatic: Secondary | ICD-10-CM | POA: Insufficient documentation

## 2015-01-30 DIAGNOSIS — M79602 Pain in left arm: Secondary | ICD-10-CM | POA: Diagnosis present

## 2015-01-30 DIAGNOSIS — M19011 Primary osteoarthritis, right shoulder: Secondary | ICD-10-CM

## 2015-01-30 DIAGNOSIS — M7060 Trochanteric bursitis, unspecified hip: Secondary | ICD-10-CM | POA: Diagnosis not present

## 2015-01-30 DIAGNOSIS — M5481 Occipital neuralgia: Secondary | ICD-10-CM

## 2015-01-30 DIAGNOSIS — M47817 Spondylosis without myelopathy or radiculopathy, lumbosacral region: Secondary | ICD-10-CM

## 2015-01-30 DIAGNOSIS — X58XXXA Exposure to other specified factors, initial encounter: Secondary | ICD-10-CM | POA: Insufficient documentation

## 2015-01-30 DIAGNOSIS — S43429D Sprain of unspecified rotator cuff capsule, subsequent encounter: Secondary | ICD-10-CM

## 2015-01-30 DIAGNOSIS — M5030 Other cervical disc degeneration, unspecified cervical region: Secondary | ICD-10-CM

## 2015-01-30 DIAGNOSIS — M533 Sacrococcygeal disorders, not elsewhere classified: Secondary | ICD-10-CM | POA: Diagnosis not present

## 2015-01-30 DIAGNOSIS — M5412 Radiculopathy, cervical region: Secondary | ICD-10-CM

## 2015-01-30 MED ORDER — TAPENTADOL HCL 50 MG PO TABS: ORAL_TABLET | ORAL | Status: DC

## 2015-01-30 MED ORDER — ORPHENADRINE CITRATE ER 100 MG PO TB12: ORAL_TABLET | ORAL | Status: DC

## 2015-01-30 MED ORDER — PREGABALIN 150 MG PO CAPS: ORAL_CAPSULE | ORAL | Status: DC

## 2015-01-30 NOTE — Progress Notes (Signed)
Safety precautions to be maintained throughout the outpatient stay will include: orient to surroundings, keep bed in low position, maintain call bell within reach at all times, provide assistance with transfer out of bed and ambulation.   Occipital Nerve Block Patient Information  Description: The occipital nerves originate in the cervical (neck) spinal cord and travel upward through muscle and tissue to supply sensation to the back of the head and top of the scalp.  In addition, the nerves control some of the muscles of the scalp.  Occipital neuralgia is an irritation of these nerves which can cause headaches, numbness of the scalp, and neck discomfort.     The occipital nerve block will interrupt nerve transmission through these nerves and can relieve pain and spasm.  The block consists of insertion of a small needle under the skin in the back of the head to deposit local anesthetic (numbing medicine) and/or steroids around the nerve.  The entire block usually lasts less than 5 minutes.  Conditions which may be treated by occipital blocks:   Muscular pain and spasm of the scalp  Nerve irritation, back of the head  Headaches  Upper neck pain  Preparation for the injection:  1. Do not eat any solid food or dairy products within 6 hours of your appointment. 2. You may drink clear liquids up to 2 hours before appointment.  Clear liquids include water, black coffee, juice or soda.  No milk or cream please. 3. You may take your regular medication, including pain medications, with a sip of water before you appointment.  Diabetics should hold regular insulin (if taken separately) and take 1/2 normal NPH dose the morning of the procedure.  Carry some sugar containing items with you to your appointment. 4. A driver must accompany you and be prepared to drive you home after your procedure. 5. Bring all your current medications with you. 6. An IV may be inserted and sedation may be given at the  discretion of the physician. 7. A blood pressure cuff, EKG, and other monitors will often be applied during the procedure.  Some patients may need to have extra oxygen administered for a short period. 8. You will be asked to provide medical information, including your allergies and medications, prior to the procedure.  We must know immediately if you are taking blood thinners (like Coumadin/Warfarin) or if you are allergic to IV iodine contrast (dye).  We must know if you could possible be pregnant.  9. Do not wear a high collared shirt or turtleneck.  Tie long hair up in the back if possible.  Possible side-effects:   Bleeding from needle site  Infection (rare, may require surgery)  Nerve injury (rare)  Hair on back of neck can be tinged with iodine scrub (this will wash out)  Light-headedness (temporary)  Pain at injection site (several days)  Decreased blood pressure (rare, temporary)  Seizure (very rare)  Call if you experience:   Hives or difficulty breathing ( go to the emergency room)  Inflammation or drainage at the injection site(s)  Please note:  Although the local anesthetic injected can often make your painful muscles or headache feel good for several hours after the injection, the pain may return.  It takes 3-7 days for steroids to work.  You may not notice any pain relief for at least one week.  If effective, we will often do a series of injections spaced 3-6 weeks apart to maximally decrease your pain.  If you have any  questions, please call [phone] [facility]  What are the risk, side effects and possible complications? Generally speaking, most procedures are safe.  However, with any procedure there are risks, side effects, and the possibility of complications.  The risks and complications are dependent upon the sites that are lesioned, or the type of nerve block to be performed.  The  closer the procedure is to the spine, the more serious the risks are.  Great care is taken when placing the radio frequency needles, block needles or lesioning probes, but sometimes complications can occur. 1. Infection: Any time there is an injection through the skin, there is a risk of infection.  This is why sterile conditions are used for these blocks.  There are four possible types of infection. 1. Localized skin infection. 2. Central Nervous System Infection-This can be in the form of Meningitis, which can be deadly. 3. Epidural Infections-This can be in the form of an epidural abscess, which can cause pressure inside of the spine, causing compression of the spinal cord with subsequent paralysis. This would require an emergency surgery to decompress, and there are no guarantees that the patient would recover from the paralysis. 4. Discitis-This is an infection of the intervertebral discs.  It occurs in about 1% of discography procedures.  It is difficult to treat and it may lead to surgery.        2. Pain: the needles have to go through skin and soft tissues, will cause soreness.       3. Damage to internal structures:  The nerves to be lesioned may be near blood vessels or    other nerves which can be potentially damaged.       4. Bleeding: Bleeding is more common if the patient is taking blood thinners such as  aspirin, Coumadin, Ticiid, Plavix, etc., or if he/she have some genetic predisposition  such as hemophilia. Bleeding into the spinal canal can cause compression of the spinal  cord with subsequent paralysis.  This would require an emergency surgery to  decompress and there are no guarantees that the patient would recover from the  paralysis.       5. Pneumothorax:  Puncturing of a lung is a possibility, every time a needle is introduced in  the area of the chest or upper back.  Pneumothorax refers to free air around the  collapsed lung(s), inside of the thoracic cavity (chest cavity).   Another two possible  complications related to a similar event would include: Hemothorax and Chylothorax.   These are variations of the Pneumothorax, where instead of air around the collapsed  lung(s), you may have blood or chyle, respectively.       6. Spinal headaches: They may occur with any procedures in the area of the spine.       7. Persistent CSF (Cerebro-Spinal Fluid) leakage: This is a rare problem, but may occur  with prolonged intrathecal or epidural catheters either due to the formation of a fistulous  track or a dural tear.       8. Nerve damage: By working so close to the spinal cord, there is always a possibility of  nerve damage, which could be as serious as a permanent spinal cord injury with  paralysis.       9. Death:  Although rare, severe deadly allergic reactions known as "Anaphylactic  reaction" can occur to any of the medications used.  10. Worsening of the symptoms:  We can always make thing worse.  What are the chances of something like this happening? Chances of any of this occuring are extremely low.  By statistics, you have more of a chance of getting killed in a motor vehicle accident: while driving to the hospital than any of the above occurring .  Nevertheless, you should be aware that they are possibilities.  In general, it is similar to taking a shower.  Everybody knows that you can slip, hit your head and get killed.  Does that mean that you should not shower again?  Nevertheless always keep in mind that statistics do not mean anything if you happen to be on the wrong side of them.  Even if a procedure has a 1 (one) in a 1,000,000 (million) chance of going wrong, it you happen to be that one..Also, keep in mind that by statistics, you have more of a chance of having something go wrong when taking medications.  Who should not have this procedure? If you are on a blood thinning medication (e.g. Coumadin, Plavix, see list of "Blood Thinners"), or if you have an active  infection going on, you should not have the procedure.  If you are taking any blood thinners, please inform your physician.  How should I prepare for this procedure?  Do not eat or drink anything at least six hours prior to the procedure.  Bring a driver with you .  It cannot be a taxi.  Come accompanied by an adult that can drive you back, and that is strong enough to help you if your legs get weak or numb from the local anesthetic.  Take all of your medicines the morning of the procedure with just enough water to swallow them.  If you have diabetes, make sure that you are scheduled to have your procedure done first thing in the morning, whenever possible.  If you have diabetes, take only half of your insulin dose and notify our nurse that you have done so as soon as you arrive at the clinic.  If you are diabetic, but only take blood sugar pills (oral hypoglycemic), then do not take them on the morning of your procedure.  You may take them after you have had the procedure.  Do not take aspirin or any aspirin-containing medications, at least eleven (11) days prior to the procedure.  They may prolong bleeding.  Wear loose fitting clothing that may be easy to take off and that you would not mind if it got stained with Betadine or blood.  Do not wear any jewelry or perfume  Remove any nail coloring.  It will interfere with some of our monitoring equipment.  NOTE: Remember that this is not meant to be interpreted as a complete list of all possible complications.  Unforeseen problems may occur.  BLOOD THINNERS The following drugs contain aspirin or other products, which can cause increased bleeding during surgery and should not be taken for 2 weeks prior to and 1 week after surgery.  If you should need take something for relief of minor pain, you may take acetaminophen which is found in Tylenol,m Datril, Anacin-3 and Panadol. It is not blood thinner. The products listed below are.  Do not  take any of the products listed below in addition to any listed on your instruction sheet.  A.P.C or A.P.C with Codeine Codeine Phosphate Capsules #3 Ibuprofen Ridaura  ABC compound Congesprin Imuran rimadil  Advil Cope Indocin Robaxisal  Alka-Seltzer Effervescent Pain Reliever and Antacid Coricidin or Coricidin-D  Indomethacin Rufen  Alka-Seltzer plus Cold Medicine Cosprin Ketoprofen S-A-C Tablets  Anacin Analgesic Tablets or Capsules Coumadin Korlgesic Salflex  Anacin Extra Strength Analgesic tablets or capsules CP-2 Tablets Lanoril Salicylate  Anaprox Cuprimine Capsules Levenox Salocol  Anexsia-D Dalteparin Magan Salsalate  Anodynos Darvon compound Magnesium Salicylate Sine-off  Ansaid Dasin Capsules Magsal Sodium Salicylate  Anturane Depen Capsules Marnal Soma  APF Arthritis pain formula Dewitt's Pills Measurin Stanback  Argesic Dia-Gesic Meclofenamic Sulfinpyrazone  Arthritis Bayer Timed Release Aspirin Diclofenac Meclomen Sulindac  Arthritis pain formula Anacin Dicumarol Medipren Supac  Analgesic (Safety coated) Arthralgen Diffunasal Mefanamic Suprofen  Arthritis Strength Bufferin Dihydrocodeine Mepro Compound Suprol  Arthropan liquid Dopirydamole Methcarbomol with Aspirin Synalgos  ASA tablets/Enseals Disalcid Micrainin Tagament  Ascriptin Doan's Midol Talwin  Ascriptin A/D Dolene Mobidin Tanderil  Ascriptin Extra Strength Dolobid Moblgesic Ticlid  Ascriptin with Codeine Doloprin or Doloprin with Codeine Momentum Tolectin  Asperbuf Duoprin Mono-gesic Trendar  Aspergum Duradyne Motrin or Motrin IB Triminicin  Aspirin plain, buffered or enteric coated Durasal Myochrisine Trigesic  Aspirin Suppositories Easprin Nalfon Trillsate  Aspirin with Codeine Ecotrin Regular or Extra Strength Naprosyn Uracel  Atromid-S Efficin Naproxen Ursinus  Auranofin Capsules Elmiron Neocylate Vanquish  Axotal Emagrin Norgesic Verin  Azathioprine Empirin or Empirin with Codeine Normiflo Vitamin E   Azolid Emprazil Nuprin Voltaren  Bayer Aspirin plain, buffered or children's or timed BC Tablets or powders Encaprin Orgaran Warfarin Sodium  Buff-a-Comp Enoxaparin Orudis Zorpin  Buff-a-Comp with Codeine Equegesic Os-Cal-Gesic   Buffaprin Excedrin plain, buffered or Extra Strength Oxalid   Bufferin Arthritis Strength Feldene Oxphenbutazone   Bufferin plain or Extra Strength Feldene Capsules Oxycodone with Aspirin   Bufferin with Codeine Fenoprofen Fenoprofen Pabalate or Pabalate-SF   Buffets II Flogesic Panagesic   Buffinol plain or Extra Strength Florinal or Florinal with Codeine Panwarfarin   Buf-Tabs Flurbiprofen Penicillamine   Butalbital Compound Four-way cold tablets Penicillin   Butazolidin Fragmin Pepto-Bismol   Carbenicillin Geminisyn Percodan   Carna Arthritis Reliever Geopen Persantine   Carprofen Gold's salt Persistin   Chloramphenicol Goody's Phenylbutazone   Chloromycetin Haltrain Piroxlcam   Clmetidine heparin Plaquenil   Cllnoril Hyco-pap Ponstel   Clofibrate Hydroxy chloroquine Propoxyphen         Before stopping any of these medications, be sure to consult the physician who ordered them.  Some, such as Coumadin (Warfarin) are ordered to prevent or treat serious conditions such as "deep thrombosis", "pumonary embolisms", and other heart problems.  The amount of time that you may need off of the medication may also vary with the medication and the reason for which you were taking it.  If you are taking any of these medications, please make sure you notify your pain physician before you undergo any procedures.

## 2015-01-30 NOTE — Progress Notes (Signed)
Subjective:    Patient ID: Vickie Stevens, female    DOB: [birth year], 44 y.o.   MRN: [id number]  HPI Patient is 44 year old female returns to [place] for further evaluation and treatment of pain involving the region of the upper extremity regions with severely limited range of motion of the left shoulder and with severe limitation of range of motion of the right shoulder. Patient also with pain of the cervical region pain radiating to the back of the head precipitating headaches and with lower back lower extremity pain. We discussed patient's condition on today's visit patient is to undergo follow-up evaluation with Dr.Daellero for functional capacity evaluation and follow-up evaluation of patient's shoulders following surgery of shoulder. Patient is to undergo a complete functional capacity evaluation and obtain a disability rating. At the present time we will continue patient's present medications and will obtain an updated MRI of the shoulder with MRI of the right shoulder to be obtained at this time and we will schedule patient for TENS unit reevaluation. The patient was understanding and agreement status treatment plan. Patient will also follow-up Dr. Alfonzo Beers and other physicians for further evaluation and treatment of her condition.      Review of Systems     Objective:   Physical Exam  There was tenderness of the splint And occipitalis region of moderate degree. Palpation of the cervical facet cervical paraspinal musculatures musketry region reproduced moderate discomfort. There was tenderness to palpation over the region of the acromioclavicular and glenohumeral joint region of severe degree. With severely limited range of motion of the left shoulder and the right shoulder. With almost complete immobilization of the left shoulder. There was moderate tenderness over the thoracic facet thoracic paraspinal musculature region without crepitus of the thoracic region noted. Patient  was with severely decreased grip strength. With moderately increased pain with Tinel and Phalen's maneuver. Palpation over the region of the lumbar paraspinal muscles lumbar facet region moderate tends to palpation with lateral bending and rotation and extension and palpation of the lumbar facets reproducing mild to moderate discomfort. There was tenderness over the PSIS and PII S region of moderate degree. Straight leg raising limited to approximately 30 without increased pain with dorsiflexion noted with negative clonus negative Homans. Moderate tenderness of the greater trochanteric region iliotibial band region.      Assessment & Plan:  Degenerative joint disease of shoulders  Rotator cuff tear of shoulder Status post surgical intervention of the shoulder  Degenerative disc disease cervical spine  Greater occipital neuralgia  Sacroiliitis sacroiliac joint dysfunction  Greater trochanteric bursitis Progress Notes   [name] (MR# [id number])      Progress Notes Info    Author Note Status Last Update User Last Update Date/Time   Mohammed Kindle, MD Signed Mohammed Kindle, MD 12/05/2014 5:13 PM    Progress Notes    Expand All Collapse All     Subjective:    Patient ID: Vickie Stevens, female DOB: 02-23-1971, 44 y.o. MRN: [id number]  HPI   Patient is 44 year old female returns to [place] for further evaluation and treatment of pain involving the region of the neck and entire back upper and lower extremity regions. Patient has had a rather complex time involving her treatment with difficulty obtaining referrals for evaluation of her condition the present time we'll continue presently prescribed medications as discussed with patient. Patient will also follow-up with Dr. Felipe Drone discussed medications and treatment with Dr. Rosine Door will follow-up with primary care physician for  further evaluation of her general medical condition as well as to address referrals.  The patient was understanding and in agreement with suggested treatment plan a should also stated that she is having difficulty with her employer and her benefits. Patient has obtained an attorney to assist with this matter. We will continue medications at this time and consider modification of medications and other expressive patient's treatment regimen pending further assessment and follow-up evaluations.     Review of Systems     Objective:   Physical Exam There was tennis of the spleen scapula and occipitalis regions of moderate to moderately severe degree with severe tenderness of the acromioclavicular and glenohumeral joint regions. There was severely limited range of motion of the shoulders on the left as well as on the right and patient was unable to perform drop test. Tinel and Phalen's maneuver associated with moderate discomfort and patient was with decreased grip strength. Palpation over the thoracic facet thoracic paraspinal musculature region reproduced pain of mild mild to moderate degree. No crepitus of the thoracic region was noted. Palpation over the lumbar paraspinal musculature region lumbar facet region was associated tends to palpation of moderate degree with lateral bending and rotation extension and palpation of the lumbar facets reproducing moderate discomfort. There was moderate tends of the greater trochanteric region iliotibial band region as well. Straight leg raising was tolerates about 30 without an increase of pain with dorsiflexion noted. There was negative clonus negative Homans. Abdomen nontender and no costovertebral angle tenderness was noted.       Assessment & Plan:     Degenerative joint disease of shoulders  Rotator cuff tear of shoulder Status post surgical intervention of the shoulder  Degenerative disc disease cervical spine  Greater occipital neuralgia  Sacroiliitis sacroiliac joint dysfunction  Greater trochanteric  bursitis    Plan    Continue present medications Lyrica Norflex and Nucynta.  F/U PCP Dr.N Humphrey Rolls  for evaliation of BP and general medical condition.  F/U surgical evaluation with Dr.Dellaero for surgical follow-up evaluation and for functional capacity evaluation and disability rating  Appointment with Dr. Rosine Door to discuss medications and general condition  F/U neurological evaluation.  MRI of right shoulder scheduled  TENS unit. Patient will be reevaluated for TENS unit use and was prescribed TENS unit Magan on today's visit  May consider radiofrequency rhizolysis or intraspinal procedures pending response to present treatment and F/U evaluation.  Patient to call [facility] should patient have concerns prior to scheduled return appointment.

## 2015-01-30 NOTE — Patient Instructions (Addendum)
Continue present medication Norflex Lyrica and Nucynta  Greater occipital nerve block to be performed Wednesday, 02/06/2015  F/U PCP Dr. Wolfgang Phoenix  for evaliation of  BP and general medical  Condition.  F/U Dr.Headen as planned   F/U surgical evaluation as discussed  F/U neurological evaluation as discussed  MRI of right shoulder to evaluate for tears, DJD, and other changes  Obtain TENS unit as discussed  May consider radiofrequency rhizolysis or intraspinal procedures pending response to present treatment and F/U evaluation.  Patient to call [facility] should patient have concerns prior to scheduled return appointment.

## 2015-02-11 ENCOUNTER — Ambulatory Visit: Attending: Pain Medicine | Admitting: Pain Medicine

## 2015-02-11 ENCOUNTER — Encounter: Payer: Self-pay | Admitting: Pain Medicine

## 2015-02-11 VITALS — BP 108/70 | HR 87 | Temp 98.1°F | Resp 16 | Ht 69.0 in | Wt 230.0 lb

## 2015-02-11 DIAGNOSIS — M62830 Muscle spasm of back: Secondary | ICD-10-CM | POA: Insufficient documentation

## 2015-02-11 DIAGNOSIS — M546 Pain in thoracic spine: Secondary | ICD-10-CM | POA: Diagnosis present

## 2015-02-11 DIAGNOSIS — M19111 Post-traumatic osteoarthritis, right shoulder: Secondary | ICD-10-CM

## 2015-02-11 DIAGNOSIS — R51 Headache: Secondary | ICD-10-CM | POA: Insufficient documentation

## 2015-02-11 DIAGNOSIS — S43421D Sprain of right rotator cuff capsule, subsequent encounter: Secondary | ICD-10-CM

## 2015-02-11 DIAGNOSIS — M19012 Primary osteoarthritis, left shoulder: Secondary | ICD-10-CM | POA: Insufficient documentation

## 2015-02-11 DIAGNOSIS — M19011 Primary osteoarthritis, right shoulder: Secondary | ICD-10-CM | POA: Diagnosis not present

## 2015-02-11 DIAGNOSIS — M75100 Unspecified rotator cuff tear or rupture of unspecified shoulder, not specified as traumatic: Secondary | ICD-10-CM

## 2015-02-11 DIAGNOSIS — M19112 Post-traumatic osteoarthritis, left shoulder: Secondary | ICD-10-CM

## 2015-02-11 DIAGNOSIS — M47812 Spondylosis without myelopathy or radiculopathy, cervical region: Secondary | ICD-10-CM | POA: Diagnosis not present

## 2015-02-11 DIAGNOSIS — M5030 Other cervical disc degeneration, unspecified cervical region: Secondary | ICD-10-CM

## 2015-02-11 DIAGNOSIS — M5412 Radiculopathy, cervical region: Secondary | ICD-10-CM

## 2015-02-11 DIAGNOSIS — M47817 Spondylosis without myelopathy or radiculopathy, lumbosacral region: Secondary | ICD-10-CM

## 2015-02-11 DIAGNOSIS — M5481 Occipital neuralgia: Secondary | ICD-10-CM

## 2015-02-11 DIAGNOSIS — M542 Cervicalgia: Secondary | ICD-10-CM | POA: Diagnosis present

## 2015-02-11 MED ORDER — ORPHENADRINE CITRATE 30 MG/ML IJ SOLN
INTRAMUSCULAR | Status: AC
  Administered 2015-02-11: 60 mg
  Filled 2015-02-11: qty 2

## 2015-02-11 MED ORDER — FENTANYL CITRATE (PF) 100 MCG/2ML IJ SOLN
INTRAMUSCULAR | Status: AC
  Administered 2015-02-11: 100 ug via INTRAVENOUS
  Filled 2015-02-11: qty 2

## 2015-02-11 MED ORDER — BUPIVACAINE HCL (PF) 0.25 % IJ SOLN
INTRAMUSCULAR | Status: AC
  Administered 2015-02-11: 20 mL
  Filled 2015-02-11: qty 30

## 2015-02-11 MED ORDER — TRIAMCINOLONE ACETONIDE 40 MG/ML IJ SUSP
INTRAMUSCULAR | Status: AC
  Administered 2015-02-11: 10 mg
  Filled 2015-02-11: qty 1

## 2015-02-11 MED ORDER — MIDAZOLAM HCL 5 MG/5ML IJ SOLN
INTRAMUSCULAR | Status: AC
  Administered 2015-02-11: 5 mg via INTRAVENOUS
  Filled 2015-02-11: qty 5

## 2015-02-11 NOTE — Patient Instructions (Addendum)
Continue present medicationLyrica Norflex and Nucynta  F/U PCP Dr.N Humphrey Rolls  for evaliation of  BP and general medical  condition  F/U surgical evaluation with Dr. Christella Hartigan  F/U neurological evaluation.  May consider radiofrequency rhizolysis or intraspinal procedures pending response to present treatment and F/U evaluation   Patient to call [facility] should patient have concerns prior to scheduled return appointmen. Pain Management Discharge Instructions  General Discharge Instructions :  If you need to reach your doctor call: Monday-Friday 8:00 am - 4:00 pm at [phone] or toll free [phone].  After clinic hours [phone] to have operator reach doctor.  Bring all of your medication bottles to all your appointments in the pain clinic.  To cancel or reschedule your appointment with Pain Management please remember to call 24 hours in advance to avoid a fee.  Refer to the educational materials which you have been given on: General Risks, I had my Procedure. Discharge Instructions, Post Sedation.  Post Procedure Instructions:  The drugs you were given will stay in your system until tomorrow, so for the next 24 hours you should not drive, make any legal decisions or drink any alcoholic beverages.  You may eat anything you prefer, but it is better to start with liquids then soups and crackers, and gradually work up to solid foods.  Please notify your doctor immediately if you have any unusual bleeding, trouble breathing or pain that is not related to your normal pain.  Depending on the type of procedure that was done, some parts of your body may feel week and/or numb.  This usually clears up by tonight or the next day.  Walk with the use of an assistive device or accompanied by an adult for the 24 hours.  You may use ice on the affected area for the first 24 hours.  Put ice in a Ziploc bag and cover with a towel and place against area 15 minutes on 15 minutes off.   You may switch to heat after 24 hours.Occipital Nerve Block Patient Information  Description: The occipital nerves originate in the cervical (neck) spinal cord and travel upward through muscle and tissue to supply sensation to the back of the head and top of the scalp.  In addition, the nerves control some of the muscles of the scalp.  Occipital neuralgia is an irritation of these nerves which can cause headaches, numbness of the scalp, and neck discomfort.     The occipital nerve block will interrupt nerve transmission through these nerves and can relieve pain and spasm.  The block consists of insertion of a small needle under the skin in the back of the head to deposit local anesthetic (numbing medicine) and/or steroids around the nerve.  The entire block usually lasts less than 5 minutes.  Conditions which may be treated by occipital blocks:   Muscular pain and spasm of the scalp  Nerve irritation, back of the head  Headaches  Upper neck pain  Preparation for the injection:  1. Do not eat any solid food or dairy products within 6 hours of your appointment. 2. You may drink clear liquids up to 2 hours before appointment.  Clear liquids include water, black coffee, juice or soda.  No milk or cream please. 3. You may take your regular medication, including pain medications, with a sip of water before you appointment.  Diabetics should hold regular insulin (if taken separately) and take 1/2 normal NPH dose the morning of the procedure.  Carry some sugar  containing items with you to your appointment. 4. A driver must accompany you and be prepared to drive you home after your procedure. 5. Bring all your current medications with you. 6. An IV may be inserted and sedation may be given at the discretion of the physician. 7. A blood pressure cuff, EKG, and other monitors will often be applied during the procedure.  Some patients may need to have extra oxygen administered for a short  period. 8. You will be asked to provide medical information, including your allergies and medications, prior to the procedure.  We must know immediately if you are taking blood thinners (like Coumadin/Warfarin) or if you are allergic to IV iodine contrast (dye).  We must know if you could possible be pregnant.  9. Do not wear a high collared shirt or turtleneck.  Tie long hair up in the back if possible.  Possible side-effects:   Bleeding from needle site  Infection (rare, may require surgery)  Nerve injury (rare)  Hair on back of neck can be tinged with iodine scrub (this will wash out)  Light-headedness (temporary)  Pain at injection site (several days)  Decreased blood pressure (rare, temporary)  Seizure (very rare)  Call if you experience:   Hives or difficulty breathing ( go to the emergency room)  Inflammation or drainage at the injection site(s)  Please note:  Although the local anesthetic injected can often make your painful muscles or headache feel good for several hours after the injection, the pain may return.  It takes 3-7 days for steroids to work.  You may not notice any pain relief for at least one week.  If effective, we will often do a series of injections spaced 3-6 weeks apart to maximally decrease your pain.  If you have any questions, please call [phone] [facility]

## 2015-02-11 NOTE — Progress Notes (Signed)
Safety precautions to be maintained throughout the outpatient stay will include: orient to surroundings, keep bed in low position, maintain call bell within reach at all times, provide assistance with transfer out of bed and ambulation.  

## 2015-02-11 NOTE — Progress Notes (Signed)
   Subjective:    Patient ID: Vickie Stevens, female    DOB: 1971-04-19, 44 y.o.   MRN: [id number]  HPI  NOTE: The patient is a 44 y.o.-year-old female-year-old female who returns to the [facility] for further evaluation and treatment of pain consisting of pain involving the region of the neck , shoulder , upper back , and headache.  Patient is with prior studies revealing patient to be with  Degenerative changes of the cervical spine as well as degenerative joint disease of the shoulders. The patient is with severe muscle spasms occurring in the cervical thoracic region with pain radiating from the splenius capitis musculature region to the occipitalis musculature regions precipitating sgnificant headaches associated with severe muscle spasms  The risks, benefits, and expectations of the procedure have been discussed and explained to patient, who is understanding and wishes to proceed with interventional treatment as discussed and as explained to patient.  Will proceed with greater occipital nerve blocks with myoneural block injections at this time as discussed and as explained to patient.  All are understanding and in agreement with suggested treatment plan.    PROCEDURE:  Greater occipital nerve block on the  left side with IV Versed, IV Fentanyl, conscious sedation, EKG, blood pressure, pulse, pulse oximetry monitoring.  Procedure performed with patient in prone position.  Greater occipital nerve block on the left side.   With patient in prone position, Betadine prep of proposed entry site accomplished.  Following identification of the nuchal ridge, 22 -gauge needle was inserted at the level of the nuchal ridge medial to the occipital artery.  Following negative aspiration, 4cc 0.25% bupivacaine with Kenalog injected for left greater occipital nerve block.  Needle was removed.  Patient tolerated injection well.   Greater occipital nerve block on the rightt side. The greater occipital nerve block on the  right side was performed exactly as the left greater occipital nerve block was performed and utilizing the same technique.   Myoneural block injections of the cervical paraspinal musculature region  Following Betadine prep of proposed entry site 22-gauge needle was inserted in the cervical paraspinal musculature region and following negative aspiration 2 cc of quarter percent bupivacaine with Norflex was injected in the cervical paraspinal musculature region  4.   The patient tolerated the procedure well   A total of 10 mg Kenalog was utilized for the entire procedure.  PLAN:    1. Medications: Will continue presently prescribed medications  Lyrica Norflex and  Nucynta  at this time. 2. Patient to follow up with primary care physician   Dr.N Humphrey Rolls  for evaluation of blood pressure and general medical condition status post procedure performed on today's visit. 3. Neurological evaluation for further assessment of headaches for further studies as discussed. 4. Surgical evaluation as discussed  as well as follow-up evaluation with Dr. Rosine Door 5. Patient may be candidate for Botox injections, radiofrequency procedures, as well as implantation type procedures pending response to treatment rendered on today's visit and pending follow-up evaluation. 6. Patient has been advised to adhere to proper body mechanics and to avoid activities which appear to aggravate condition.cations:  Will continue presently prescribed medications at this time. 7. The patient is understanding and in agreement with the suggested treatment plan.   Review of Systems     Objective:   Physical Exam        Assessment & Plan:

## 2015-02-12 ENCOUNTER — Telehealth: Payer: Self-pay | Admitting: *Deleted

## 2015-02-12 NOTE — Telephone Encounter (Signed)
No problems post procedure. 

## 2015-02-28 ENCOUNTER — Encounter: Payer: Self-pay | Admitting: Pain Medicine

## 2015-02-28 ENCOUNTER — Ambulatory Visit: Attending: Pain Medicine | Admitting: Pain Medicine

## 2015-02-28 VITALS — BP 135/98 | HR 85 | Temp 98.0°F | Resp 18 | Ht 68.0 in | Wt 222.0 lb

## 2015-02-28 DIAGNOSIS — M542 Cervicalgia: Secondary | ICD-10-CM | POA: Diagnosis present

## 2015-02-28 DIAGNOSIS — M5412 Radiculopathy, cervical region: Secondary | ICD-10-CM

## 2015-02-28 DIAGNOSIS — R51 Headache: Secondary | ICD-10-CM | POA: Diagnosis present

## 2015-02-28 DIAGNOSIS — M5136 Other intervertebral disc degeneration, lumbar region: Secondary | ICD-10-CM | POA: Diagnosis not present

## 2015-02-28 DIAGNOSIS — M7060 Trochanteric bursitis, unspecified hip: Secondary | ICD-10-CM | POA: Diagnosis not present

## 2015-02-28 DIAGNOSIS — Z9889 Other specified postprocedural states: Secondary | ICD-10-CM | POA: Diagnosis not present

## 2015-02-28 DIAGNOSIS — M5416 Radiculopathy, lumbar region: Secondary | ICD-10-CM

## 2015-02-28 DIAGNOSIS — M47817 Spondylosis without myelopathy or radiculopathy, lumbosacral region: Secondary | ICD-10-CM

## 2015-02-28 DIAGNOSIS — M19112 Post-traumatic osteoarthritis, left shoulder: Secondary | ICD-10-CM

## 2015-02-28 DIAGNOSIS — M19011 Primary osteoarthritis, right shoulder: Secondary | ICD-10-CM | POA: Insufficient documentation

## 2015-02-28 DIAGNOSIS — M5481 Occipital neuralgia: Secondary | ICD-10-CM | POA: Insufficient documentation

## 2015-02-28 DIAGNOSIS — M19012 Primary osteoarthritis, left shoulder: Secondary | ICD-10-CM | POA: Insufficient documentation

## 2015-02-28 DIAGNOSIS — M533 Sacrococcygeal disorders, not elsewhere classified: Secondary | ICD-10-CM | POA: Diagnosis not present

## 2015-02-28 DIAGNOSIS — M5030 Other cervical disc degeneration, unspecified cervical region: Secondary | ICD-10-CM | POA: Insufficient documentation

## 2015-02-28 DIAGNOSIS — S43429D Sprain of unspecified rotator cuff capsule, subsequent encounter: Secondary | ICD-10-CM

## 2015-02-28 DIAGNOSIS — M75100 Unspecified rotator cuff tear or rupture of unspecified shoulder, not specified as traumatic: Secondary | ICD-10-CM

## 2015-02-28 DIAGNOSIS — M19111 Post-traumatic osteoarthritis, right shoulder: Secondary | ICD-10-CM

## 2015-02-28 MED ORDER — TAPENTADOL HCL 50 MG PO TABS: ORAL_TABLET | ORAL | Status: DC

## 2015-02-28 MED ORDER — PREGABALIN 150 MG PO CAPS: ORAL_CAPSULE | ORAL | Status: DC

## 2015-02-28 MED ORDER — ORPHENADRINE CITRATE ER 100 MG PO TB12: ORAL_TABLET | ORAL | Status: DC

## 2015-02-28 NOTE — Progress Notes (Signed)
Subjective:    Patient ID: Vickie Stevens, female    DOB: 07-28-1970, 44 y.o.   MRN: [id number]  HPI  Patient is 44 year old female returns to [facility] for further evaluation and treatment of pain involving the neck headaches shoulders upper mid and lower back upper and lower extremity regions. Patient has had pain involving the left side of the neck greater than the right side of the neck with headaches of moderate degree. We discussed patient's overall condition at the present time patient will undergo follow-up evaluation with Dr. Christella Hartigan . Dr. Christella Hartigan perform surgery of patient's shoulder. Patient also is of lower back lower extremity pain and weakness. We discussed patient's overall condition at the present time we'll proceed with interventional treatment for treatment of patient's headache at time return appointment. We will also discussed obtaining MRI as well as PNCV EMG studies for evaluation of lumbar lower extremity pain paresthesias and weakness. We will await evaluation by Dr. Christella Hartigan at this time and proceed with greater occipital nerve block at time return appointment for headaches and we will also proceed with further neurological evaluation as discussed and as explained to patient on today's visit. The patient was understanding and in agreement status treatment plan the patient will continue Norflex Nucynta and Lyrica at this time  Review of Systems     Objective:   Physical Exam  There was tenderness of the splenius capitis and occipitalis musculature region of moderate moderately severe degree on the left compared to the right. No masses of the head and neck were noted. There was tenderness of the temporomandibular joint region of mild degree. Palpation of the cervical paraspinal muscles and cervical facet region reproduced predominant portion of patient's pain. There was tennis to palpation of the acromioclavicular glenohumeral joint region of severe degree.  Patient was with inability to perform drop test and was severely limited range of motion of the acromioclavicular joint region on the left as well as on the right. Patient was with markedly decreased grip strength on the left compared to the right as well with decreased grip strength on the left as well as on the right. Tinel and Phalen's maneuver associated with mild to moderate discomfort. Palpation of the thoracic facet thoracic paraspinal muscles region was with moderate to moderately severe discomfort with moderate to moderately severe muscle spasms. Lumbar paraspinal musculature region lumbar facet region of moderate degree. There was tenderness over the PSIS and PII S region a moderate degree. Moderate tends to palpation was noted of the greater trochanteric region and along the iliotibial band region. Patient had difficulty attempt attempted to stand on tiptoes and heels. Lateral bending and rotation and extension to palpation lumbar facets reproduce moderate discomfort. This patient had difficulty relaxing. No definite sensory deficit of dermatomal distribution was detected. There was negative clonus negative Homans. Abdomen nontender and no costovertebral maintenance was noted.      Assessment & Plan:    Degenerative joint disease of shoulders  Rotator cuff tear of shoulder Status post surgical intervention of the shoulder  Degenerative disc disease cervical spine  Greater occipital neuralgia  Sacroiliitis sacroiliac joint dysfunction  Greater trochanteric bursitis  Degenerative disc disease lumbar spine  Lumbar radiculitis  Sacroiliac joint dysfunction    Plan  Continue present medications Norflex Lyrica  and. Nucynta  F/U  PCP Dr Wolfgang Phoenix for evaliation of  BP and general medical  condition  F/U surgical evaluation Dr Christella Hartigan as planned. Surgical evaluation of lumbar lower extremity pain  as discussed   F/U Dr Rosine Door as discussed and as planned   F/U neurological  evaluation.. Patient undergo further neurological evaluation as discussed including consideration for PNCV/EMG studies as discussed   Continue use of TENS unit. Patient states that TENS unit has been beneficial and will continue the use  .  May consider radiofrequency rhizolysis or intraspinal procedures pending response to present treatment and F/U evaluation   Patient to call Pain Management Center should patient have concerns prior to scheduled return appointment.

## 2015-02-28 NOTE — Patient Instructions (Addendum)
Continue present medications Lyrica Norflex and Nucynta  Greater occipital nerve block to be performed at time of return appointment  F/U PCP Dr. Wolfgang Phoenix for evaliation of  BP and general medical  condition  F/U surgical evaluation with Dr.Dellaero as discussed  F/U neurological evaluation As discussed we could consider obtaining PNCV/EMG studies as well as lumbar MRI to evaluate the lower back and lower extremity pain paresthesias and weakness  May consider radiofrequency rhizolysis or intraspinal procedures pending response to present treatment and F/U evaluation   Patient to call Pain Management Center should patient have concerns prior to scheduled return appointmen. Occipital Nerve Block Patient Information  Description: The occipital nerves originate in the cervical (neck) spinal cord and travel upward through muscle and tissue to supply sensation to the back of the head and top of the scalp.  In addition, the nerves control some of the muscles of the scalp.  Occipital neuralgia is an irritation of these nerves which can cause headaches, numbness of the scalp, and neck discomfort.     The occipital nerve block will interrupt nerve transmission through these nerves and can relieve pain and spasm.  The block consists of insertion of a small needle under the skin in the back of the head to deposit local anesthetic (numbing medicine) and/or steroids around the nerve.  The entire block usually lasts less than 5 minutes.  Conditions which may be treated by occipital blocks:   Muscular pain and spasm of the scalp  Nerve irritation, back of the head  Headaches  Upper neck pain  Preparation for the injection:  1. Do not eat any solid food or dairy products within 6 hours of your appointment. 2. You may drink clear liquids up to 2 hours before appointment.  Clear liquids include water, black coffee, juice or soda.  No milk or cream please. 3. You may take your regular medication,  including pain medications, with a sip of water before you appointment.  Diabetics should hold regular insulin (if taken separately) and take 1/2 normal NPH dose the morning of the procedure.  Carry some sugar containing items with you to your appointment. 4. A driver must accompany you and be prepared to drive you home after your procedure. 5. Bring all your current medications with you. 6. An IV may be inserted and sedation may be given at the discretion of the physician. 7. A blood pressure cuff, EKG, and other monitors will often be applied during the procedure.  Some patients may need to have extra oxygen administered for a short period. 8. You will be asked to provide medical information, including your allergies and medications, prior to the procedure.  We must know immediately if you are taking blood thinners (like Coumadin/Warfarin) or if you are allergic to IV iodine contrast (dye).  We must know if you could possible be pregnant.  9. Do not wear a high collared shirt or turtleneck.  Tie long hair up in the back if possible.  Possible side-effects:   Bleeding from needle site  Infection (rare, may require surgery)  Nerve injury (rare)  Hair on back of neck can be tinged with iodine scrub (this will wash out)  Light-headedness (temporary)  Pain at injection site (several days)  Decreased blood pressure (rare, temporary)  Seizure (very rare)  Call if you experience:   Hives or difficulty breathing ( go to the emergency room)  Inflammation or drainage at the injection site(s)  Please note:  Although the local anesthetic  injected can often make your painful muscles or headache feel good for several hours after the injection, the pain may return.  It takes 3-7 days for steroids to work.  You may not notice any pain relief for at least one week.  If effective, we will often do a series of injections spaced 3-6 weeks apart to maximally decrease your pain.  If you have any  questions, please call [phone] [place]  What are the risk, side effects and possible complications? Generally speaking, most procedures are safe.  However, with any procedure there are risks, side effects, and the possibility of complications.  The risks and complications are dependent upon the sites that are lesioned, or the type of nerve block to be performed.  The closer the procedure is to the spine, the more serious the risks are.  Great care is taken when placing the radio frequency needles, block needles or lesioning probes, but sometimes complications can occur. 1. Infection: Any time there is an injection through the skin, there is a risk of infection.  This is why sterile conditions are used for these blocks.  There are four possible types of infection. 1. Localized skin infection. 2. Central Nervous System Infection-This can be in the form of Meningitis, which can be deadly. 3. Epidural Infections-This can be in the form of an epidural abscess, which can cause pressure inside of the spine, causing compression of the spinal cord with subsequent paralysis. This would require an emergency surgery to decompress, and there are no guarantees that the patient would recover from the paralysis. 4. Discitis-This is an infection of the intervertebral discs.  It occurs in about 1% of discography procedures.  It is difficult to treat and it may lead to surgery.        2. Pain: the needles have to go through skin and soft tissues, will cause soreness.       3. Damage to internal structures:  The nerves to be lesioned may be near blood vessels or    other nerves which can be potentially damaged.       4. Bleeding: Bleeding is more common if the patient is taking blood thinners such as  aspirin, Coumadin, Ticiid, Plavix, etc., or if he/she have some genetic predisposition  such as hemophilia. Bleeding into the spinal canal can  cause compression of the spinal  cord with subsequent paralysis.  This would require an emergency surgery to  decompress and there are no guarantees that the patient would recover from the  paralysis.       5. Pneumothorax:  Puncturing of a lung is a possibility, every time a needle is introduced in  the area of the chest or upper back.  Pneumothorax refers to free air around the  collapsed lung(s), inside of the thoracic cavity (chest cavity).  Another two possible  complications related to a similar event would include: Hemothorax and Chylothorax.   These are variations of the Pneumothorax, where instead of air around the collapsed  lung(s), you may have blood or chyle, respectively.       6. Spinal headaches: They may occur with any procedures in the area of the spine.       7. Persistent CSF (Cerebro-Spinal Fluid) leakage: This is a rare problem, but may occur  with prolonged intrathecal or epidural catheters either due to the formation of a fistulous  track or a dural tear.  8. Nerve damage: By working so close to the spinal cord, there is always a possibility of  nerve damage, which could be as serious as a permanent spinal cord injury with  paralysis.       9. Death:  Although rare, severe deadly allergic reactions known as "Anaphylactic  reaction" can occur to any of the medications used.      10. Worsening of the symptoms:  We can always make thing worse.  What are the chances of something like this happening? Chances of any of this occuring are extremely low.  By statistics, you have more of a chance of getting killed in a motor vehicle accident: while driving to the hospital than any of the above occurring .  Nevertheless, you should be aware that they are possibilities.  In general, it is similar to taking a shower.  Everybody knows that you can slip, hit your head and get killed.  Does that mean that you should not shower again?  Nevertheless always keep in mind that statistics do not mean  anything if you happen to be on the wrong side of them.  Even if a procedure has a 1 (one) in a 1,000,000 (million) chance of going wrong, it you happen to be that one..Also, keep in mind that by statistics, you have more of a chance of having something go wrong when taking medications.  Who should not have this procedure? If you are on a blood thinning medication (e.g. Coumadin, Plavix, see list of "Blood Thinners"), or if you have an active infection going on, you should not have the procedure.  If you are taking any blood thinners, please inform your physician.  How should I prepare for this procedure?  Do not eat or drink anything at least six hours prior to the procedure.  Bring a driver with you .  It cannot be a taxi.  Come accompanied by an adult that can drive you back, and that is strong enough to help you if your legs get weak or numb from the local anesthetic.  Take all of your medicines the morning of the procedure with just enough water to swallow them.  If you have diabetes, make sure that you are scheduled to have your procedure done first thing in the morning, whenever possible.  If you have diabetes, take only half of your insulin dose and notify our nurse that you have done so as soon as you arrive at the clinic.  If you are diabetic, but only take blood sugar pills (oral hypoglycemic), then do not take them on the morning of your procedure.  You may take them after you have had the procedure.  Do not take aspirin or any aspirin-containing medications, at least eleven (11) days prior to the procedure.  They may prolong bleeding.  Wear loose fitting clothing that may be easy to take off and that you would not mind if it got stained with Betadine or blood.  Do not wear any jewelry or perfume  Remove any nail coloring.  It will interfere with some of our monitoring equipment.  NOTE: Remember that this is not meant to be interpreted as a complete list of all possible  complications.  Unforeseen problems may occur.  BLOOD THINNERS The following drugs contain aspirin or other products, which can cause increased bleeding during surgery and should not be taken for 2 weeks prior to and 1 week after surgery.  If you should need take something for relief of minor  pain, you may take acetaminophen which is found in Tylenol,m Datril, Anacin-3 and Panadol. It is not blood thinner. The products listed below are.  Do not take any of the products listed below in addition to any listed on your instruction sheet.  A.P.C or A.P.C with Codeine Codeine Phosphate Capsules #3 Ibuprofen Ridaura  ABC compound Congesprin Imuran rimadil  Advil Cope Indocin Robaxisal  Alka-Seltzer Effervescent Pain Reliever and Antacid Coricidin or Coricidin-D  Indomethacin Rufen  Alka-Seltzer plus Cold Medicine Cosprin Ketoprofen S-A-C Tablets  Anacin Analgesic Tablets or Capsules Coumadin Korlgesic Salflex  Anacin Extra Strength Analgesic tablets or capsules CP-2 Tablets Lanoril Salicylate  Anaprox Cuprimine Capsules Levenox Salocol  Anexsia-D Dalteparin Magan Salsalate  Anodynos Darvon compound Magnesium Salicylate Sine-off  Ansaid Dasin Capsules Magsal Sodium Salicylate  Anturane Depen Capsules Marnal Soma  APF Arthritis pain formula Dewitt's Pills Measurin Stanback  Argesic Dia-Gesic Meclofenamic Sulfinpyrazone  Arthritis Bayer Timed Release Aspirin Diclofenac Meclomen Sulindac  Arthritis pain formula Anacin Dicumarol Medipren Supac  Analgesic (Safety coated) Arthralgen Diffunasal Mefanamic Suprofen  Arthritis Strength Bufferin Dihydrocodeine Mepro Compound Suprol  Arthropan liquid Dopirydamole Methcarbomol with Aspirin Synalgos  ASA tablets/Enseals Disalcid Micrainin Tagament  Ascriptin Doan's Midol Talwin  Ascriptin A/D Dolene Mobidin Tanderil  Ascriptin Extra Strength Dolobid Moblgesic Ticlid  Ascriptin with Codeine Doloprin or Doloprin with Codeine Momentum Tolectin  Asperbuf Duoprin  Mono-gesic Trendar  Aspergum Duradyne Motrin or Motrin IB Triminicin  Aspirin plain, buffered or enteric coated Durasal Myochrisine Trigesic  Aspirin Suppositories Easprin Nalfon Trillsate  Aspirin with Codeine Ecotrin Regular or Extra Strength Naprosyn Uracel  Atromid-S Efficin Naproxen Ursinus  Auranofin Capsules Elmiron Neocylate Vanquish  Axotal Emagrin Norgesic Verin  Azathioprine Empirin or Empirin with Codeine Normiflo Vitamin E  Azolid Emprazil Nuprin Voltaren  Bayer Aspirin plain, buffered or children's or timed BC Tablets or powders Encaprin Orgaran Warfarin Sodium  Buff-a-Comp Enoxaparin Orudis Zorpin  Buff-a-Comp with Codeine Equegesic Os-Cal-Gesic   Buffaprin Excedrin plain, buffered or Extra Strength Oxalid   Bufferin Arthritis Strength Feldene Oxphenbutazone   Bufferin plain or Extra Strength Feldene Capsules Oxycodone with Aspirin   Bufferin with Codeine Fenoprofen Fenoprofen Pabalate or Pabalate-SF   Buffets II Flogesic Panagesic   Buffinol plain or Extra Strength Florinal or Florinal with Codeine Panwarfarin   Buf-Tabs Flurbiprofen Penicillamine   Butalbital Compound Four-way cold tablets Penicillin   Butazolidin Fragmin Pepto-Bismol   Carbenicillin Geminisyn Percodan   Carna Arthritis Reliever Geopen Persantine   Carprofen Gold's salt Persistin   Chloramphenicol Goody's Phenylbutazone   Chloromycetin Haltrain Piroxlcam   Clmetidine heparin Plaquenil   Cllnoril Hyco-pap Ponstel   Clofibrate Hydroxy chloroquine Propoxyphen         Before stopping any of these medications, be sure to consult the physician who ordered them.  Some, such as Coumadin (Warfarin) are ordered to prevent or treat serious conditions such as "deep thrombosis", "pumonary embolisms", and other heart problems.  The amount of time that you may need off of the medication may also vary with the medication and the reason for which you were taking it.  If you are taking any of these medications, please  make sure you notify your pain physician before you undergo any procedures.

## 2015-02-28 NOTE — Progress Notes (Signed)
Safety precautions to be maintained throughout the outpatient stay will include: orient to surroundings, keep bed in low position, maintain call bell within reach at all times, provide assistance with transfer out of bed and ambulation.  

## 2015-03-14 ENCOUNTER — Other Ambulatory Visit: Payer: Self-pay | Admitting: Pain Medicine

## 2015-04-02 ENCOUNTER — Ambulatory Visit: Attending: Pain Medicine | Admitting: Pain Medicine

## 2015-04-02 ENCOUNTER — Encounter: Payer: Self-pay | Admitting: Pain Medicine

## 2015-04-02 VITALS — BP 125/74 | HR 91 | Temp 98.0°F | Resp 18 | Ht 69.0 in | Wt 230.0 lb

## 2015-04-02 DIAGNOSIS — M75100 Unspecified rotator cuff tear or rupture of unspecified shoulder, not specified as traumatic: Secondary | ICD-10-CM

## 2015-04-02 DIAGNOSIS — M533 Sacrococcygeal disorders, not elsewhere classified: Secondary | ICD-10-CM | POA: Diagnosis not present

## 2015-04-02 DIAGNOSIS — M542 Cervicalgia: Secondary | ICD-10-CM | POA: Diagnosis present

## 2015-04-02 DIAGNOSIS — M19112 Post-traumatic osteoarthritis, left shoulder: Secondary | ICD-10-CM

## 2015-04-02 DIAGNOSIS — Z9889 Other specified postprocedural states: Secondary | ICD-10-CM | POA: Insufficient documentation

## 2015-04-02 DIAGNOSIS — S43429D Sprain of unspecified rotator cuff capsule, subsequent encounter: Secondary | ICD-10-CM

## 2015-04-02 DIAGNOSIS — M5416 Radiculopathy, lumbar region: Secondary | ICD-10-CM | POA: Insufficient documentation

## 2015-04-02 DIAGNOSIS — M461 Sacroiliitis, not elsewhere classified: Secondary | ICD-10-CM | POA: Diagnosis not present

## 2015-04-02 DIAGNOSIS — M47817 Spondylosis without myelopathy or radiculopathy, lumbosacral region: Secondary | ICD-10-CM

## 2015-04-02 DIAGNOSIS — M19012 Primary osteoarthritis, left shoulder: Secondary | ICD-10-CM

## 2015-04-02 DIAGNOSIS — M19119 Post-traumatic osteoarthritis, unspecified shoulder: Secondary | ICD-10-CM

## 2015-04-02 DIAGNOSIS — M7060 Trochanteric bursitis, unspecified hip: Secondary | ICD-10-CM | POA: Diagnosis not present

## 2015-04-02 DIAGNOSIS — M5136 Other intervertebral disc degeneration, lumbar region: Secondary | ICD-10-CM | POA: Insufficient documentation

## 2015-04-02 DIAGNOSIS — M5412 Radiculopathy, cervical region: Secondary | ICD-10-CM

## 2015-04-02 DIAGNOSIS — M5481 Occipital neuralgia: Secondary | ICD-10-CM | POA: Diagnosis not present

## 2015-04-02 DIAGNOSIS — S43421D Sprain of right rotator cuff capsule, subsequent encounter: Secondary | ICD-10-CM

## 2015-04-02 DIAGNOSIS — R51 Headache: Secondary | ICD-10-CM | POA: Diagnosis present

## 2015-04-02 DIAGNOSIS — M19111 Post-traumatic osteoarthritis, right shoulder: Secondary | ICD-10-CM

## 2015-04-02 DIAGNOSIS — M19011 Primary osteoarthritis, right shoulder: Secondary | ICD-10-CM | POA: Diagnosis not present

## 2015-04-02 DIAGNOSIS — M5030 Other cervical disc degeneration, unspecified cervical region: Secondary | ICD-10-CM

## 2015-04-02 DIAGNOSIS — M75101 Unspecified rotator cuff tear or rupture of right shoulder, not specified as traumatic: Secondary | ICD-10-CM

## 2015-04-02 MED ORDER — TAPENTADOL HCL 50 MG PO TABS: ORAL_TABLET | ORAL | Status: DC

## 2015-04-02 MED ORDER — ORPHENADRINE CITRATE ER 100 MG PO TB12: ORAL_TABLET | ORAL | Status: DC

## 2015-04-02 NOTE — Progress Notes (Signed)
   Subjective:    Patient ID: Vickie Stevens, female    DOB: October 19, 1970, 44 y.o.   MRN: [id number]  HPI Patient is 44 year old female returns to [place] for further evaluation and treatment of pain consisting of pain in the neck associated with headaches as well as severe pain of the shoulders upper back region with lower back and lower extremity pain is well.the patient has undergone partial opacity evaluation and disability hearing. We discussed patient's condition on today's visit the patient was with complaint of headache radiating from the back of the neck to the back of the head. She appears to be with severe muscle spasms contributing to component of greater occipital neuralgia myofascial pain related headaches. His condition and will proceed with greater occipital nerve block to be performed at time return appointment in attempt to decrease severity of patient's symptoms, minimize progression of symptoms, and avoid the need for more involved treatment. The patient will continue Lyrica Norflex and Nucynta as prescribed. The patient was with understanding and in agreement with suggested treatment plan.     Review of Systems     Objective:   Physical Exam  There was tends to palpation over the paraspinal muscle region cervical region cervical facet region of moderately severe degree with moderately severe muscle spasms noted and cervical paraspinal musculature region. There was severe tenderness to palpation of the splenius capitis and occipitalis musculature regions. There appeared to be unremarkable Spurling's maneuver. Palpation over the thoracic facet thoracic paraspinal musculature and was with moderately severe tenderness to palpation as well. There was severe tends to palpation of the acromioclavicular and glenohumeral joint regions. Patient was with severely limited range of motion of the left and right shoulders. There was decreased grip strength of the left and right  upper extremities of severe degree. There appeared to be moderate increased pain with Tinel and Phalen's maneuver. There was tends to palpation over the mid and lower thoracic paraspinal muscles region of moderate degree. No crepitus of the thoracic region was noted. There was moderate tends to palpation over the lumbar paraspinal muscles region lumbar facet region as well as the PSIS PII S region gluteal and piriformis musculature regions.lateral bending and rotation and extension and palpation of the lumbar facets reproduce moderate discomfort.Straight leg raising was tolerates approximately 30 without increased pain with dorsiflexion noted. DTRs were trace at the knees. EHL strength appeared to be slightly decreased. There was negative clonus negative Homans. No sensory deficit of dermatomal distribution detected.abdomen was nontender with no costovertebral angle tenderness noted.     Assessment & Plan:    Degenerative joint disease of shoulders  Rotator cuff tear of shoulder Status post surgical intervention of the shoulder  Degenerative disc disease cervical spine  Greater occipital neuralgia  Sacroiliitis sacroiliac joint dysfunction  Greater trochanteric bursitis  Degenerative disc disease lumbar spine  Lumbar radiculitis  Sacroiliac joint dysfunction    PLAN   Continue present medication Lyrica, Norflex, and Nucynta  Greater occipital nerve block to be performed at time of return appointment  F/U PCP  Dr.N Humphrey Rolls  for evaliation of  BP and general medical  condition  F/U surgical evaluation as discussed  F/U neurological evaluation as discussed  F/U Dr. Baron Hamper as planned  May consider radiofrequency rhizolysis or intraspinal procedures pending response to present treatment and F/U evaluation   Patient to call Pain Management Center should patient have concerns prior to scheduled return appointment.

## 2015-04-02 NOTE — Progress Notes (Signed)
Safety precautions to be maintained throughout the outpatient stay will include: orient to surroundings, keep bed in low position, maintain call bell within reach at all times, provide assistance with transfer out of bed and ambulation.  

## 2015-04-02 NOTE — Patient Instructions (Addendum)
PLAN   Continue present medication Lyrica Norflex and Nucynta  Greater occipital nerve block to be performed at time of return appointment  F/U PCP Dr.N Humphrey Rolls   for evaliation of  BP and general medical  condition  F/U surgical evaluation as discussed  F/U neurological evaluation as discussed  F/U Dr. Rosine Door as planned  May consider radiofrequency rhizolysis or intraspinal procedures pending response to present treatment and F/U evaluation   Patient to call Pain Management Center should patient have concerns prior to scheduled return appointment. Pain Management Discharge Instructions  General Discharge Instructions :  If you need to reach your doctor call: Monday-Friday 8:00 am - 4:00 pm at [phone] or toll free [phone].  After clinic hours [phone] to have operator reach doctor.  Bring all of your medication bottles to all your appointments in the pain clinic.  To cancel or reschedule your appointment with Pain Management please remember to call 24 hours in advance to avoid a fee.  Refer to the educational materials which you have been given on: General Risks, I had my Procedure. Discharge Instructions, Post Sedation.  Occipital Nerve Block Patient Information  Description: The occipital nerves originate in the cervical (neck) spinal cord and travel upward through muscle and tissue to supply sensation to the back of the head and top of the scalp.  In addition, the nerves control some of the muscles of the scalp.  Occipital neuralgia is an irritation of these nerves which can cause headaches, numbness of the scalp, and neck discomfort.     The occipital nerve block will interrupt nerve transmission through these nerves and can relieve pain and spasm.  The block consists of insertion of a small needle under the skin in the back of the head to deposit local anesthetic (numbing medicine) and/or steroids around the nerve.  The entire block usually lasts less than 5  minutes.  Conditions which may be treated by occipital blocks:   Muscular pain and spasm of the scalp  Nerve irritation, back of the head  Headaches  Upper neck pain  Preparation for the injection:  1. Do not eat any solid food or dairy products within 6 hours of your appointment. 2. You may drink clear liquids up to 2 hours before appointment.  Clear liquids include water, black coffee, juice or soda.  No milk or cream please. 3. You may take your regular medication, including pain medications, with a sip of water before you appointment.  Diabetics should hold regular insulin (if taken separately) and take 1/2 normal NPH dose the morning of the procedure.  Carry some sugar containing items with you to your appointment. 4. A driver must accompany you and be prepared to drive you home after your procedure. 5. Bring all your current medications with you. 6. An IV may be inserted and sedation may be given at the discretion of the physician. 7. A blood pressure cuff, EKG, and other monitors will often be applied during the procedure.  Some patients may need to have extra oxygen administered for a short period. 8. You will be asked to provide medical information, including your allergies and medications, prior to the procedure.  We must know immediately if you are taking blood thinners (like Coumadin/Warfarin) or if you are allergic to IV iodine contrast (dye).  We must know if you could possible be pregnant.  9. Do not wear a high collared shirt or turtleneck.  Tie long hair up in the back if possible.  Possible side-effects:  Bleeding from needle site  Infection (rare, may require surgery)  Nerve injury (rare)  Hair on back of neck can be tinged with iodine scrub (this will wash out)  Light-headedness (temporary)  Pain at injection site (several days)  Decreased blood pressure (rare, temporary)  Seizure (very rare)  Call if you experience:   Hives or difficulty breathing  ( go to the emergency room)  Inflammation or drainage at the injection site(s)  Please note:  Although the local anesthetic injected can often make your painful muscles or headache feel good for several hours after the injection, the pain may return.  It takes 3-7 days for steroids to work.  You may not notice any pain relief for at least one week.  If effective, we will often do a series of injections spaced 3-6 weeks apart to maximally decrease your pain.  If you have any questions, please call [phone] [facility]

## 2015-04-25 ENCOUNTER — Other Ambulatory Visit: Payer: Self-pay | Admitting: Pain Medicine

## 2015-05-02 ENCOUNTER — Encounter: Payer: Self-pay | Admitting: Pain Medicine

## 2015-05-02 ENCOUNTER — Ambulatory Visit: Attending: Pain Medicine | Admitting: Pain Medicine

## 2015-05-02 VITALS — BP 116/81 | HR 96 | Temp 98.0°F | Resp 16 | Ht 69.0 in | Wt 235.0 lb

## 2015-05-02 DIAGNOSIS — S43421D Sprain of right rotator cuff capsule, subsequent encounter: Secondary | ICD-10-CM

## 2015-05-02 DIAGNOSIS — M19111 Post-traumatic osteoarthritis, right shoulder: Secondary | ICD-10-CM

## 2015-05-02 DIAGNOSIS — M47817 Spondylosis without myelopathy or radiculopathy, lumbosacral region: Secondary | ICD-10-CM

## 2015-05-02 DIAGNOSIS — M75101 Unspecified rotator cuff tear or rupture of right shoulder, not specified as traumatic: Secondary | ICD-10-CM

## 2015-05-02 DIAGNOSIS — M5030 Other cervical disc degeneration, unspecified cervical region: Secondary | ICD-10-CM

## 2015-05-02 DIAGNOSIS — M19011 Primary osteoarthritis, right shoulder: Secondary | ICD-10-CM | POA: Diagnosis not present

## 2015-05-02 DIAGNOSIS — M461 Sacroiliitis, not elsewhere classified: Secondary | ICD-10-CM | POA: Diagnosis not present

## 2015-05-02 DIAGNOSIS — M533 Sacrococcygeal disorders, not elsewhere classified: Secondary | ICD-10-CM

## 2015-05-02 DIAGNOSIS — M19119 Post-traumatic osteoarthritis, unspecified shoulder: Secondary | ICD-10-CM

## 2015-05-02 DIAGNOSIS — M5481 Occipital neuralgia: Secondary | ICD-10-CM | POA: Diagnosis not present

## 2015-05-02 DIAGNOSIS — R51 Headache: Secondary | ICD-10-CM | POA: Insufficient documentation

## 2015-05-02 DIAGNOSIS — M75100 Unspecified rotator cuff tear or rupture of unspecified shoulder, not specified as traumatic: Secondary | ICD-10-CM

## 2015-05-02 DIAGNOSIS — M5136 Other intervertebral disc degeneration, lumbar region: Secondary | ICD-10-CM | POA: Diagnosis not present

## 2015-05-02 DIAGNOSIS — M5416 Radiculopathy, lumbar region: Secondary | ICD-10-CM | POA: Diagnosis not present

## 2015-05-02 DIAGNOSIS — M19112 Post-traumatic osteoarthritis, left shoulder: Secondary | ICD-10-CM

## 2015-05-02 DIAGNOSIS — M19012 Primary osteoarthritis, left shoulder: Secondary | ICD-10-CM | POA: Diagnosis not present

## 2015-05-02 DIAGNOSIS — M5412 Radiculopathy, cervical region: Secondary | ICD-10-CM

## 2015-05-02 DIAGNOSIS — S43429D Sprain of unspecified rotator cuff capsule, subsequent encounter: Secondary | ICD-10-CM

## 2015-05-02 MED ORDER — TAPENTADOL HCL 50 MG PO TABS: ORAL_TABLET | ORAL | Status: DC

## 2015-05-02 MED ORDER — ORPHENADRINE CITRATE ER 100 MG PO TB12: ORAL_TABLET | ORAL | Status: DC

## 2015-05-02 NOTE — Progress Notes (Signed)
Subjective:    Patient ID: Vickie Stevens, female    DOB: Feb 07, 1971, 44 y.o.   MRN: [id number]  HPI  Patient is 44 year old female who returns to [facility] for further evaluation and treatment of pain involving the neck with pain of the neck associated with severe spasms precipitating headaches. Patient is without recent trauma change in events of daily living the call significant change in symptomatology. Patient continues to be with severely decreased range of motion of the shoulders as well as significant decreased strength of the upper extremities. Patient is with lower back and lower extremity pain is well. At the present time we will proceed with greater occipital nerve block to be performed at time return appointment in attempt to decrease severity of headaches, minimize progression of symptoms, and avoid need for more involved treatment. The patient was with understanding and in agreement with suggested treatment plan.     Review of Systems     Objective:   Physical Exam  There was tennis to palpation of the splenius capitis and occipitalis musculature regions. There was moderately severe tenderness to palpation of the splenius capitis and occipitalis musketry region on the left greater than the right. Patient was with severe muscle spasms noted in the on the left side compared to the right side. There was tends to palpation over the region of the cervical facet cervical paraspinal musculature region as well as the thoracic facet thoracic paraspinal musculature region. Patient was severely decreased range of motion of the upper extremities and was severely decreased grip strength as well. Tinel and Phalen's maneuver were without increase of pain of significant degree. There appeared to be unremarkable Spurling's maneuver. Range of motion maneuvers of the cervical spine were limited and were with slight radiation of pain toward the upper extremities. There was tenderness over  the thoracic facet thoracic paraspinal muscle region with evidence of moderate muscle spasms of the thoracic paraspinal musculature region without crepitus of the thoracic region noted. Palpation over the lumbar paraspinal muscles lumbar facet region associated with moderate discomfort. Lateral bending and rotation and extension and palpation of the lumbar facets reproduce moderate discomfort. Straight leg raising was tolerates approximately 30 without increased pain with dorsiflexion noted. There was moderate tends to palpation of the greater trochanteric region and iliotibial band region as well as moderate tends to palpation of the PSIS and PII S region gluteal and piriformis musculature regions. There was increase of pain with pressure prior to the ilium with patient in lateral decubitus position. Straight leg raising was decreased DTRs were difficult to elicit at the knees. There was negative clonus negative Homans. No definite sensory deficit of dermatomal distribution detected. Abdomen nontender with no costovertebral tenderness noted.    Assessment & Plan:   Bilateral occipital neuralgia  Degenerative joint disease of shoulders  Rotator cuff tear of shoulder Status post surgical intervention of the shoulder  Degenerative disc disease cervical spine  Sacroiliitis sacroiliac joint dysfunction  Greater trochanteric bursitis  Degenerative disc disease lumbar spine  Lumbar radiculitis    PLAN   Continue present medication Lyrica, Norflex, and Nucynta  Greater occipital nerve block to be performed at time of return appointment  F/U PCP  Dr.N Humphrey Rolls  for evaliation of  BP and general medical  condition  F/U surgical evaluation as discussed  F/U Dr Rosine Door as planned this week  F/U neurological evaluation as discussed  As discussed will consider [facility] EMG studies for further assessment of patient's condition F/U Dr.  Head as planned  May consider radiofrequency rhizolysis or  intraspinal procedures pending response to present treatment and F/U evaluation   Patient to call [facility] Center should patient have concerns prior to scheduled return appointment.  Sacroiliac joint dysfunction

## 2015-05-02 NOTE — Progress Notes (Signed)
Safety precautions to be maintained throughout the outpatient stay will include: orient to surroundings, keep bed in low position, maintain call bell within reach at all times, provide assistance with transfer out of bed and ambulation.  

## 2015-05-02 NOTE — Patient Instructions (Addendum)
PLAN  Continue present medication Lyrica Norflex and Nucynta  Greater occipital nerve block to be performed at time return appointment. Please ask staff if insurance has approved your procedure as we previously discussed today   F/U PCP Dr.N Humphrey Rolls   for evaliation of  BP and general medical  condition  F/U surgical evaluation as discussed  F/U neurological evaluation as discussed  F/U Dr. Rosine Door as planned this week  F/U with ophthalmologist as discussed  May consider radiofrequency rhizolysis or intraspinal procedures pending response to present treatment and F/U evaluation   Patient to call Pain Management Center should patient have concerns prior to scheduled return appointment.GENERAL RISKS AND COMPLICATIONS  What are the risk, side effects and possible complications? Generally speaking, most procedures are safe.  However, with any procedure there are risks, side effects, and the possibility of complications.  The risks and complications are dependent upon the sites that are lesioned, or the type of nerve block to be performed.  The closer the procedure is to the spine, the more serious the risks are.  Great care is taken when placing the radio frequency needles, block needles or lesioning probes, but sometimes complications can occur. 1. Infection: Any time there is an injection through the skin, there is a risk of infection.  This is why sterile conditions are used for these blocks.  There are four possible types of infection. 1. Localized skin infection. 2. Central Nervous System Infection-This can be in the form of Meningitis, which can be deadly. 3. Epidural Infections-This can be in the form of an epidural abscess, which can cause pressure inside of the spine, causing compression of the spinal cord with subsequent paralysis. This would require an emergency surgery to decompress, and there are no guarantees that the patient would recover from the paralysis. 4. Discitis-This is an  infection of the intervertebral discs.  It occurs in about 1% of discography procedures.  It is difficult to treat and it may lead to surgery.        2. Pain: the needles have to go through skin and soft tissues, will cause soreness.       3. Damage to internal structures:  The nerves to be lesioned may be near blood vessels or    other nerves which can be potentially damaged.       4. Bleeding: Bleeding is more common if the patient is taking blood thinners such as  aspirin, Coumadin, Ticiid, Plavix, etc., or if he/she have some genetic predisposition  such as hemophilia. Bleeding into the spinal canal can cause compression of the spinal  cord with subsequent paralysis.  This would require an emergency surgery to  decompress and there are no guarantees that the patient would recover from the  paralysis.       5. Pneumothorax:  Puncturing of a lung is a possibility, every time a needle is introduced in  the area of the chest or upper back.  Pneumothorax refers to free air around the  collapsed lung(s), inside of the thoracic cavity (chest cavity).  Another two possible  complications related to a similar event would include: Hemothorax and Chylothorax.   These are variations of the Pneumothorax, where instead of air around the collapsed  lung(s), you may have blood or chyle, respectively.       6. Spinal headaches: They may occur with any procedures in the area of the spine.       7. Persistent CSF (Cerebro-Spinal Fluid) leakage: This is a  rare problem, but may occur  with prolonged intrathecal or epidural catheters either due to the formation of a fistulous  track or a dural tear.       8. Nerve damage: By working so close to the spinal cord, there is always a possibility of  nerve damage, which could be as serious as a permanent spinal cord injury with  paralysis.       9. Death:  Although rare, severe deadly allergic reactions known as "Anaphylactic  reaction" can occur to any of the medications used.       10. Worsening of the symptoms:  We can always make thing worse.  What are the chances of something like this happening? Chances of any of this occuring are extremely low.  By statistics, you have more of a chance of getting killed in a motor vehicle accident: while driving to the hospital than any of the above occurring .  Nevertheless, you should be aware that they are possibilities.  In general, it is similar to taking a shower.  Everybody knows that you can slip, hit your head and get killed.  Does that mean that you should not shower again?  Nevertheless always keep in mind that statistics do not mean anything if you happen to be on the wrong side of them.  Even if a procedure has a 1 (one) in a 1,000,000 (million) chance of going wrong, it you happen to be that one..Also, keep in mind that by statistics, you have more of a chance of having something go wrong when taking medications.  Who should not have this procedure? If you are on a blood thinning medication (e.g. Coumadin, Plavix, see list of "Blood Thinners"), or if you have an active infection going on, you should not have the procedure.  If you are taking any blood thinners, please inform your physician.  How should I prepare for this procedure?  Do not eat or drink anything at least six hours prior to the procedure.  Bring a driver with you .  It cannot be a taxi.  Come accompanied by an adult that can drive you back, and that is strong enough to help you if your legs get weak or numb from the local anesthetic.  Take all of your medicines the morning of the procedure with just enough water to swallow them.  If you have diabetes, make sure that you are scheduled to have your procedure done first thing in the morning, whenever possible.  If you have diabetes, take only half of your insulin dose and notify our nurse that you have done so as soon as you arrive at the clinic.  If you are diabetic, but only take blood sugar pills  (oral hypoglycemic), then do not take them on the morning of your procedure.  You may take them after you have had the procedure.  Do not take aspirin or any aspirin-containing medications, at least eleven (11) days prior to the procedure.  They may prolong bleeding.  Wear loose fitting clothing that may be easy to take off and that you would not mind if it got stained with Betadine or blood.  Do not wear any jewelry or perfume  Remove any nail coloring.  It will interfere with some of our monitoring equipment.  NOTE: Remember that this is not meant to be interpreted as a complete list of all possible complications.  Unforeseen problems may occur.  BLOOD THINNERS The following drugs contain aspirin or other products, which  can cause increased bleeding during surgery and should not be taken for 2 weeks prior to and 1 week after surgery.  If you should need take something for relief of minor pain, you may take acetaminophen which is found in Tylenol,m Datril, Anacin-3 and Panadol. It is not blood thinner. The products listed below are.  Do not take any of the products listed below in addition to any listed on your instruction sheet.  A.P.C or A.P.C with Codeine Codeine Phosphate Capsules #3 Ibuprofen Ridaura  ABC compound Congesprin Imuran rimadil  Advil Cope Indocin Robaxisal  Alka-Seltzer Effervescent Pain Reliever and Antacid Coricidin or Coricidin-D  Indomethacin Rufen  Alka-Seltzer plus Cold Medicine Cosprin Ketoprofen S-A-C Tablets  Anacin Analgesic Tablets or Capsules Coumadin Korlgesic Salflex  Anacin Extra Strength Analgesic tablets or capsules CP-2 Tablets Lanoril Salicylate  Anaprox Cuprimine Capsules Levenox Salocol  Anexsia-D Dalteparin Magan Salsalate  Anodynos Darvon compound Magnesium Salicylate Sine-off  Ansaid Dasin Capsules Magsal Sodium Salicylate  Anturane Depen Capsules Marnal Soma  APF Arthritis pain formula Dewitt's Pills Measurin Stanback  Argesic Dia-Gesic  Meclofenamic Sulfinpyrazone  Arthritis Bayer Timed Release Aspirin Diclofenac Meclomen Sulindac  Arthritis pain formula Anacin Dicumarol Medipren Supac  Analgesic (Safety coated) Arthralgen Diffunasal Mefanamic Suprofen  Arthritis Strength Bufferin Dihydrocodeine Mepro Compound Suprol  Arthropan liquid Dopirydamole Methcarbomol with Aspirin Synalgos  ASA tablets/Enseals Disalcid Micrainin Tagament  Ascriptin Doan's Midol Talwin  Ascriptin A/D Dolene Mobidin Tanderil  Ascriptin Extra Strength Dolobid Moblgesic Ticlid  Ascriptin with Codeine Doloprin or Doloprin with Codeine Momentum Tolectin  Asperbuf Duoprin Mono-gesic Trendar  Aspergum Duradyne Motrin or Motrin IB Triminicin  Aspirin plain, buffered or enteric coated Durasal Myochrisine Trigesic  Aspirin Suppositories Easprin Nalfon Trillsate  Aspirin with Codeine Ecotrin Regular or Extra Strength Naprosyn Uracel  Atromid-S Efficin Naproxen Ursinus  Auranofin Capsules Elmiron Neocylate Vanquish  Axotal Emagrin Norgesic Verin  Azathioprine Empirin or Empirin with Codeine Normiflo Vitamin E  Azolid Emprazil Nuprin Voltaren  Bayer Aspirin plain, buffered or children's or timed BC Tablets or powders Encaprin Orgaran Warfarin Sodium  Buff-a-Comp Enoxaparin Orudis Zorpin  Buff-a-Comp with Codeine Equegesic Os-Cal-Gesic   Buffaprin Excedrin plain, buffered or Extra Strength Oxalid   Bufferin Arthritis Strength Feldene Oxphenbutazone   Bufferin plain or Extra Strength Feldene Capsules Oxycodone with Aspirin   Bufferin with Codeine Fenoprofen Fenoprofen Pabalate or Pabalate-SF   Buffets II Flogesic Panagesic   Buffinol plain or Extra Strength Florinal or Florinal with Codeine Panwarfarin   Buf-Tabs Flurbiprofen Penicillamine   Butalbital Compound Four-way cold tablets Penicillin   Butazolidin Fragmin Pepto-Bismol   Carbenicillin Geminisyn Percodan   Carna Arthritis Reliever Geopen Persantine   Carprofen Gold's salt Persistin    Chloramphenicol Goody's Phenylbutazone   Chloromycetin Haltrain Piroxlcam   Clmetidine heparin Plaquenil   Cllnoril Hyco-pap Ponstel   Clofibrate Hydroxy chloroquine Propoxyphen         Before stopping any of these medications, be sure to consult the physician who ordered them.  Some, such as Coumadin (Warfarin) are ordered to prevent or treat serious conditions such as "deep thrombosis", "pumonary embolisms", and other heart problems.  The amount of time that you may need off of the medication may also vary with the medication and the reason for which you were taking it.  If you are taking any of these medications, please make sure you notify your pain physician before you undergo any procedures.         Occipital Nerve Block Patient Information  Description: The occipital nerves originate in  the cervical (neck) spinal cord and travel upward through muscle and tissue to supply sensation to the back of the head and top of the scalp.  In addition, the nerves control some of the muscles of the scalp.  Occipital neuralgia is an irritation of these nerves which can cause headaches, numbness of the scalp, and neck discomfort.     The occipital nerve block will interrupt nerve transmission through these nerves and can relieve pain and spasm.  The block consists of insertion of a small needle under the skin in the back of the head to deposit local anesthetic (numbing medicine) and/or steroids around the nerve.  The entire block usually lasts less than 5 minutes.  Conditions which may be treated by occipital blocks:   Muscular pain and spasm of the scalp  Nerve irritation, back of the head  Headaches  Upper neck pain  Preparation for the injection:  12. Do not eat any solid food or dairy products within 6 hours of your appointment. 13. You may drink clear liquids up to 2 hours before appointment.  Clear liquids include water, black coffee, juice or soda.  No milk or cream  please. 14. You may take your regular medication, including pain medications, with a sip of water before you appointment.  Diabetics should hold regular insulin (if taken separately) and take 1/2 normal NPH dose the morning of the procedure.  Carry some sugar containing items with you to your appointment. 15. A driver must accompany you and be prepared to drive you home after your procedure. 42. Bring all your current medications with you. 17. An IV may be inserted and sedation may be given at the discretion of the physician. 18. A blood pressure cuff, EKG, and other monitors will often be applied during the procedure.  Some patients may need to have extra oxygen administered for a short period. 68. You will be asked to provide medical information, including your allergies and medications, prior to the procedure.  We must know immediately if you are taking blood thinners (like Coumadin/Warfarin) or if you are allergic to IV iodine contrast (dye).  We must know if you could possible be pregnant.  20. Do not wear a high collared shirt or turtleneck.  Tie long hair up in the back if possible.  Possible side-effects:   Bleeding from needle site  Infection (rare, may require surgery)  Nerve injury (rare)  Hair on back of neck can be tinged with iodine scrub (this will wash out)  Light-headedness (temporary)  Pain at injection site (several days)  Decreased blood pressure (rare, temporary)  Seizure (very rare)  Call if you experience:   Hives or difficulty breathing ( go to the emergency room)  Inflammation or drainage at the injection site(s)  Please note:  Although the local anesthetic injected can often make your painful muscles or headache feel good for several hours after the injection, the pain may return.  It takes 3-7 days for steroids to work.  You may not notice any pain relief for at least one week.  If effective, we will often do a series of injections spaced 3-6 weeks  apart to maximally decrease your pain.  If you have any questions, please call [phone] [facility]

## 2015-05-06 ENCOUNTER — Other Ambulatory Visit: Payer: Self-pay | Admitting: Pain Medicine

## 2015-05-06 DIAGNOSIS — M19011 Primary osteoarthritis, right shoulder: Secondary | ICD-10-CM

## 2015-05-06 DIAGNOSIS — S46001D Unspecified injury of muscle(s) and tendon(s) of the rotator cuff of right shoulder, subsequent encounter: Secondary | ICD-10-CM

## 2015-05-13 ENCOUNTER — Ambulatory Visit: Attending: Pain Medicine | Admitting: Pain Medicine

## 2015-05-13 ENCOUNTER — Encounter: Payer: Self-pay | Admitting: Pain Medicine

## 2015-05-13 VITALS — BP 114/90 | HR 88 | Temp 98.1°F | Resp 16 | Ht 69.0 in | Wt 235.0 lb

## 2015-05-13 DIAGNOSIS — M75100 Unspecified rotator cuff tear or rupture of unspecified shoulder, not specified as traumatic: Secondary | ICD-10-CM

## 2015-05-13 DIAGNOSIS — M19119 Post-traumatic osteoarthritis, unspecified shoulder: Secondary | ICD-10-CM

## 2015-05-13 DIAGNOSIS — R51 Headache: Secondary | ICD-10-CM | POA: Diagnosis present

## 2015-05-13 DIAGNOSIS — M5481 Occipital neuralgia: Secondary | ICD-10-CM

## 2015-05-13 DIAGNOSIS — M19011 Primary osteoarthritis, right shoulder: Secondary | ICD-10-CM

## 2015-05-13 DIAGNOSIS — S43421D Sprain of right rotator cuff capsule, subsequent encounter: Secondary | ICD-10-CM

## 2015-05-13 DIAGNOSIS — M533 Sacrococcygeal disorders, not elsewhere classified: Secondary | ICD-10-CM

## 2015-05-13 DIAGNOSIS — M5030 Other cervical disc degeneration, unspecified cervical region: Secondary | ICD-10-CM | POA: Diagnosis not present

## 2015-05-13 DIAGNOSIS — M19111 Post-traumatic osteoarthritis, right shoulder: Secondary | ICD-10-CM

## 2015-05-13 DIAGNOSIS — M542 Cervicalgia: Secondary | ICD-10-CM | POA: Diagnosis present

## 2015-05-13 DIAGNOSIS — M19112 Post-traumatic osteoarthritis, left shoulder: Secondary | ICD-10-CM

## 2015-05-13 DIAGNOSIS — M5416 Radiculopathy, lumbar region: Secondary | ICD-10-CM

## 2015-05-13 DIAGNOSIS — M19012 Primary osteoarthritis, left shoulder: Secondary | ICD-10-CM

## 2015-05-13 DIAGNOSIS — M75101 Unspecified rotator cuff tear or rupture of right shoulder, not specified as traumatic: Secondary | ICD-10-CM

## 2015-05-13 DIAGNOSIS — M47817 Spondylosis without myelopathy or radiculopathy, lumbosacral region: Secondary | ICD-10-CM

## 2015-05-13 DIAGNOSIS — S43429D Sprain of unspecified rotator cuff capsule, subsequent encounter: Secondary | ICD-10-CM

## 2015-05-13 DIAGNOSIS — M5412 Radiculopathy, cervical region: Secondary | ICD-10-CM

## 2015-05-13 MED ORDER — FENTANYL CITRATE (PF) 100 MCG/2ML IJ SOLN
INTRAMUSCULAR | Status: AC
  Administered 2015-05-13: 100 ug via INTRAVENOUS
  Filled 2015-05-13: qty 2

## 2015-05-13 MED ORDER — BUPIVACAINE HCL (PF) 0.5 % IJ SOLN
INTRAMUSCULAR | Status: AC
  Filled 2015-05-13: qty 30

## 2015-05-13 MED ORDER — FENTANYL CITRATE (PF) 100 MCG/2ML IJ SOLN
100.0000 ug | Freq: Once | INTRAMUSCULAR | Status: AC
  Administered 2015-05-13: 100 ug via INTRAVENOUS

## 2015-05-13 MED ORDER — ORPHENADRINE CITRATE 30 MG/ML IJ SOLN
60.0000 mg | Freq: Once | INTRAMUSCULAR | Status: AC
  Administered 2015-05-13: 12:00:00 via INTRAMUSCULAR

## 2015-05-13 MED ORDER — TRIAMCINOLONE ACETONIDE 40 MG/ML IJ SUSP
INTRAMUSCULAR | Status: AC
  Filled 2015-05-13: qty 1

## 2015-05-13 MED ORDER — TRIAMCINOLONE ACETONIDE 40 MG/ML IJ SUSP
40.0000 mg | Freq: Once | INTRAMUSCULAR | Status: AC
  Administered 2015-05-13: 12:00:00

## 2015-05-13 MED ORDER — BUPIVACAINE HCL (PF) 0.5 % IJ SOLN
30.0000 mL | Freq: Once | INTRAMUSCULAR | Status: AC
  Administered 2015-05-13: 12:00:00

## 2015-05-13 MED ORDER — MIDAZOLAM HCL 5 MG/5ML IJ SOLN
5.0000 mg | Freq: Once | INTRAMUSCULAR | Status: AC
  Administered 2015-05-13: 5 mg via INTRAVENOUS

## 2015-05-13 MED ORDER — ORPHENADRINE CITRATE 30 MG/ML IJ SOLN
INTRAMUSCULAR | Status: AC
  Filled 2015-05-13: qty 2

## 2015-05-13 MED ORDER — LACTATED RINGERS IV SOLN: 1000.0000 mL | INTRAVENOUS | Status: DC

## 2015-05-13 MED ORDER — MIDAZOLAM HCL 5 MG/5ML IJ SOLN
INTRAMUSCULAR | Status: AC
  Administered 2015-05-13: 5 mg via INTRAVENOUS
  Filled 2015-05-13: qty 5

## 2015-05-13 NOTE — Patient Instructions (Addendum)
Continue present medication Lyrica Norflex and Nucynta  Greater occipital nerve block to be performed at time return appointment. Please ask staff if insurance has approved your procedure as we previously discussed today   F/U PCP Dr.N Humphrey Rolls   for evaliation of  BP and general medical  condition  F/U surgical evaluation as discussed  F/U neurological evaluation as discussed  F/U Dr. Rosine Door as planned this week  F/U with ophthalmologist as discussedMay consider radiofrequency rhizolysis or intraspinal procedures pending response to present treatment and F/U evaluation    Patient to call Pain Management Center should patient have concerns prior to scheduled return appointmentOccipital Nerve Block Patient Information  Description: The occipital nerves originate in the cervical (neck) spinal cord and travel upward through muscle and tissue to supply sensation to the back of the head and top of the scalp.  In addition, the nerves control some of the muscles of the scalp.  Occipital neuralgia is an irritation of these nerves which can cause headaches, numbness of the scalp, and neck discomfort.     The occipital nerve block will interrupt nerve transmission through these nerves and can relieve pain and spasm.  The block consists of insertion of a small needle under the skin in the back of the head to deposit local anesthetic (numbing medicine) and/or steroids around the nerve.  The entire block usually lasts less than 5 minutes.  Conditions which may be treated by occipital blocks:   Muscular pain and spasm of the scalp  Nerve irritation, back of the head  Headaches  Upper neck pain  Preparation for the injection:  1. Do not eat any solid food or dairy products within 6 hours of your appointment. 2. You may drink clear liquids up to 2 hours before appointment.  Clear liquids include water, black coffee, juice or soda.  No milk or cream please. 3. You may take your regular medication,  including pain medications, with a sip of water before you appointment.  Diabetics should hold regular insulin (if taken separately) and take 1/2 normal NPH dose the morning of the procedure.  Carry some sugar containing items with you to your appointment. 4. A driver must accompany you and be prepared to drive you home after your procedure. 5. Bring all your current medications with you. 6. An IV may be inserted and sedation may be given at the discretion of the physician. 7. A blood pressure cuff, EKG, and other monitors will often be applied during the procedure.  Some patients may need to have extra oxygen administered for a short period. 8. You will be asked to provide medical information, including your allergies and medications, prior to the procedure.  We must know immediately if you are taking blood thinners (like Coumadin/Warfarin) or if you are allergic to IV iodine contrast (dye).  We must know if you could possible be pregnant.  9. Do not wear a high collared shirt or turtleneck.  Tie long hair up in the back if possible.  Possible side-effects:   Bleeding from needle site  Infection (rare, may require surgery)  Nerve injury (rare)  Hair on back of neck can be tinged with iodine scrub (this will wash out)  Light-headedness (temporary)  Pain at injection site (several days)  Decreased blood pressure (rare, temporary)  Seizure (very rare)  Call if you experience:   Hives or difficulty breathing ( go to the emergency room)  Inflammation or drainage at the injection site(s)  Please note:  Although the  local anesthetic injected can often make your painful muscles or headache feel good for several hours after the injection, the pain may return.  It takes 3-7 days for steroids to work.  You may not notice any pain relief for at least one week.  If effective, we will often do a series of injections spaced 3-6 weeks apart to maximally decrease your pain.  If you have any  questions, please call [phone] [facility] Pain Management Discharge Instructions  General Discharge Instructions :  If you need to reach your doctor call: Monday-Friday 8:00 am - 4:00 pm at [phone] or toll free [phone].  After clinic hours [phone] to have operator reach doctor.  Bring all of your medication bottles to all your appointments in the pain clinic.  To cancel or reschedule your appointment with Pain Management please remember to call 24 hours in advance to avoid a fee.  Refer to the educational materials which you have been given on: General Risks, I had my Procedure. Discharge Instructions, Post Sedation.  Post Procedure Instructions:  The drugs you were given will stay in your system until tomorrow, so for the next 24 hours you should not drive, make any legal decisions or drink any alcoholic beverages.  You may eat anything you prefer, but it is better to start with liquids then soups and crackers, and gradually work up to solid foods.  Please notify your doctor immediately if you have any unusual bleeding, trouble breathing or pain that is not related to your normal pain.  Depending on the type of procedure that was done, some parts of your body may feel week and/or numb.  This usually clears up by tonight or the next day.  Walk with the use of an assistive device or accompanied by an adult for the 24 hours.  You may use ice on the affected area for the first 24 hours.  Put ice in a Ziploc bag and cover with a towel and place against area 15 minutes on 15 minutes off.  You may switch to heat after 24 hours.

## 2015-05-13 NOTE — Progress Notes (Signed)
   Subjective:    Patient ID: Vickie Stevens, female    DOB: 14-Oct-1970, 44 y.o.   MRN: [id number]  HPI  NOTE: The patient is a 45 y.o.-year-old female who [facility] the [facility] for further evaluation and treatment of pain consisting of pain involving the region of the neck and headache.  Patient is with prior studies revealing patient to be with degenerative changes of the cervical spine. The patient is a pain that radiates from the posterior aspect of the neck to the back of the head and continuing forward to behind the region of the eye. There is concern regarding significant component of the patient's pain being  due to occipital neuralgia .  The risks, benefits, and expectations of the procedure have been discussed and explained to patient, who is understanding and wishes to proceed with interventional treatment as discussed and as explained to patient.  Will proceed with greater occipital nerve blocks with myoneural block injections at this time as discussed and as explained to patient.  All are understanding and in agreement with suggested treatment plan.    PROCEDURE:  Greater occipital nerve block on the left side with IV Versed, IV Fentanyl, conscious sedation, EKG, blood pressure, pulse, pulse oximetry monitoring.  Procedure performed with patient in prone position.  Greater occipital nerve block on the left side.   With patient in prone position, Betadine prep of proposed entry site accomplished.  Following identification of the nuchal ridge, 22 -gauge needle was inserted at the level of the nuchal ridge medial to the occipital artery.  Following negative aspiration, 4cc 0.25% bupivacaine with Kenalog injected for left greater occipital nerve block.  Needle was removed.  Patient tolerated injection well.   Greater occipital nerve block on the rightt side. The greater occipital nerve block on the right side was performed exactly as the left greater occipital nerve block was  performed and utilizing the same technique  The patient tolerated the procedure well.   A total of 10 mg Kenalog was utilized for the entire procedure.  PLAN:    1. Medications: Will continue presently prescribed medications Norflex Nucynta and Lyrica at this time. 2. Patient to follow up with primary care physician Dr. Wolfgang Phoenix  for evaluation of blood pressure and general medical condition status post procedure performed on today's visit. 3. Neurological evaluation for further assessment of headaches for further studies as discussed. F/U Dr Rosine Door as planned  4. Surgical evaluation as discussed.  5. Patient may be candidate for Botox injections, radiofrequency procedures, as well as implantation type procedures pending response to treatment rendered on today's visit and pending follow-up evaluation. 6. Patient has been advised to adhere to proper body mechanics and to avoid activities which appear to aggravate condition.cations:  Will continue presently prescribed medications at this time. 7. The patient is understanding and in agreement with the suggested treatment plan.     Review of Systems     Objective:   Physical Exam        Assessment & Plan:

## 2015-05-13 NOTE — Progress Notes (Signed)
Safety precautions to be maintained throughout the outpatient stay will include: orient to surroundings, keep bed in low position, maintain call bell within reach at all times, provide assistance with transfer out of bed and ambulation.  

## 2015-05-14 ENCOUNTER — Telehealth: Payer: Self-pay | Admitting: *Deleted

## 2015-05-14 NOTE — Telephone Encounter (Signed)
No problems post procedure. 

## 2015-05-23 ENCOUNTER — Ambulatory Visit: Payer: Federal, State, Local not specified - PPO

## 2015-05-23 ENCOUNTER — Telehealth: Payer: Self-pay | Admitting: Pain Medicine

## 2015-05-23 NOTE — Telephone Encounter (Signed)
Call patient concerning a left shoulder injection. She wants to get Workers comp approval asap before she moves to [employer]

## 2015-06-03 ENCOUNTER — Encounter: Payer: Self-pay | Admitting: Pain Medicine

## 2015-06-03 ENCOUNTER — Ambulatory Visit: Attending: Pain Medicine | Admitting: Pain Medicine

## 2015-06-03 VITALS — BP 122/88 | HR 91 | Temp 97.8°F | Resp 16 | Ht 69.0 in | Wt 230.0 lb

## 2015-06-03 DIAGNOSIS — Z9889 Other specified postprocedural states: Secondary | ICD-10-CM | POA: Insufficient documentation

## 2015-06-03 DIAGNOSIS — M19011 Primary osteoarthritis, right shoulder: Secondary | ICD-10-CM | POA: Diagnosis not present

## 2015-06-03 DIAGNOSIS — M47817 Spondylosis without myelopathy or radiculopathy, lumbosacral region: Secondary | ICD-10-CM

## 2015-06-03 DIAGNOSIS — X58XXXA Exposure to other specified factors, initial encounter: Secondary | ICD-10-CM | POA: Insufficient documentation

## 2015-06-03 DIAGNOSIS — M5030 Other cervical disc degeneration, unspecified cervical region: Secondary | ICD-10-CM

## 2015-06-03 DIAGNOSIS — M542 Cervicalgia: Secondary | ICD-10-CM | POA: Diagnosis present

## 2015-06-03 DIAGNOSIS — M5412 Radiculopathy, cervical region: Secondary | ICD-10-CM

## 2015-06-03 DIAGNOSIS — M19119 Post-traumatic osteoarthritis, unspecified shoulder: Secondary | ICD-10-CM

## 2015-06-03 DIAGNOSIS — M461 Sacroiliitis, not elsewhere classified: Secondary | ICD-10-CM | POA: Insufficient documentation

## 2015-06-03 DIAGNOSIS — M170 Bilateral primary osteoarthritis of knee: Secondary | ICD-10-CM

## 2015-06-03 DIAGNOSIS — M79601 Pain in right arm: Secondary | ICD-10-CM | POA: Diagnosis present

## 2015-06-03 DIAGNOSIS — M75100 Unspecified rotator cuff tear or rupture of unspecified shoulder, not specified as traumatic: Secondary | ICD-10-CM | POA: Diagnosis not present

## 2015-06-03 DIAGNOSIS — M19012 Primary osteoarthritis, left shoulder: Secondary | ICD-10-CM | POA: Diagnosis not present

## 2015-06-03 DIAGNOSIS — M19111 Post-traumatic osteoarthritis, right shoulder: Secondary | ICD-10-CM

## 2015-06-03 DIAGNOSIS — M75101 Unspecified rotator cuff tear or rupture of right shoulder, not specified as traumatic: Secondary | ICD-10-CM

## 2015-06-03 DIAGNOSIS — S43421D Sprain of right rotator cuff capsule, subsequent encounter: Secondary | ICD-10-CM

## 2015-06-03 DIAGNOSIS — M7060 Trochanteric bursitis, unspecified hip: Secondary | ICD-10-CM | POA: Diagnosis not present

## 2015-06-03 DIAGNOSIS — M5416 Radiculopathy, lumbar region: Secondary | ICD-10-CM

## 2015-06-03 DIAGNOSIS — M79602 Pain in left arm: Secondary | ICD-10-CM | POA: Diagnosis present

## 2015-06-03 DIAGNOSIS — M5481 Occipital neuralgia: Secondary | ICD-10-CM

## 2015-06-03 DIAGNOSIS — M533 Sacrococcygeal disorders, not elsewhere classified: Secondary | ICD-10-CM | POA: Diagnosis not present

## 2015-06-03 DIAGNOSIS — S43429D Sprain of unspecified rotator cuff capsule, subsequent encounter: Secondary | ICD-10-CM

## 2015-06-03 DIAGNOSIS — M19112 Post-traumatic osteoarthritis, left shoulder: Secondary | ICD-10-CM

## 2015-06-03 MED ORDER — ORPHENADRINE CITRATE ER 100 MG PO TB12: ORAL_TABLET | ORAL | Status: DC

## 2015-06-03 MED ORDER — TAPENTADOL HCL 50 MG PO TABS: ORAL_TABLET | ORAL | Status: DC

## 2015-06-03 MED ORDER — PREGABALIN 150 MG PO CAPS: ORAL_CAPSULE | ORAL | Status: DC

## 2015-06-03 NOTE — Progress Notes (Signed)
Subjective:    Patient ID: Vickie Stevens, female    DOB: [birth year], 44 y.o.   MRN: [id number]  HPI The patient is a 44 year old female who returns to pain management for further evaluation and treatment of pain which is involving the neck upper extremity regions mid lower back and lower extremity regions. The patient has history of trauma sustained while on the job. The patient is with known abnormalities of the left and right shoulders and is status post surgical intervention of the shoulder. The patient continues to be severely disabling pain involving the cervical thoracic and lumbar upper and lower extremity regions. We discussed patient's condition on today's visit and we will continue present medications and will await response to present treatment and consider modifications of treatment regimen at time of return appointment. We have discussed various treatments for patient including interventional treatment of the cervical region consisting of injections for pain involving the cervical region as well as pain involving headaches. We will also discuss interventional treatment of the shoulders and have discussed interventional treatment of the lower back and lower extremity regions. At the present time we will continue to observe response to the present treatment regimen and we'll remain available to recommend additional treatment for patient pending follow-up evaluation. The patient is understanding and agreed to suggested treatment plan   Review of Systems     Objective:   Physical Exam  There was tenderness to palpation of the paraspinal muscular region cervical region cervical facet region palpation which reproduces pain of moderate discomfort. There was moderate tenderness to palpation over the acromioclavicular and glenohumeral joint region. Patient was with severely limited range of motion of the shoulders and was unable to perform drop test. There was decreased grip strength and Tinel  and Phalen's maneuver was associated with mild discomfort. Palpation over the region of the thoracic facet thoracic paraspinal musculature region was attends to palpation of moderate degree. There was evidence of moderate muscle spasms of the thoracic region noted. No crepitus of the thoracic region was noted. Palpation over the lumbar paraspinal muscular treat and lumbar facet region was attends to palpation of moderate degree. Lateral bending rotation extension and palpation of the lumbar facets reproduce moderate discomfort. There was moderate discomfort in the region of the PSIS and PII S region as well as the greater trochanteric region and iliotibial band region. Straight leg raising was tolerates approximately 30 EHL strength appeared to be decreased with no definite sensory deficit or dermatomal distribution detected. There appeared to be negative clonus negative Homans. Abdomen was nontender with no costovertebral tenderness noted.      Assessment & Plan:     Bilateral occipital neuralgia  Degenerative joint disease of shoulders  Rotator cuff tear of shoulder Status post surgical intervention of the shoulder  Degenerative disc disease cervical spine  Sacroiliitis sacroiliac joint dysfunction  Greater trochanteric bursitis    PLAN  Continue present medication Lyrica Norflex and Nucynta  F/U PCP Dr. Wolfgang Phoenix for evaliation of  BP and general medical  condition  F/U surgical evaluation as discussed  F/U neurological evaluation as discussed  Recommend patient for physical therapy with instructions for home exercise program in attempt to prevent deterioration of patient's condition. Patient is to avoid maneuvers which aggravate condition. Also recommend patient for surgical reevaluation for patient to receive additional instructions on physical therapy program from surgeon in attempt to maximize benefit from physical therapy and to avoid aggravation of symptoms.  May consider  radiofrequency rhizolysis or  intraspinal procedures pending response to present treatment and F/U evaluation   Patient to call [facility] should patient have concerns prior to scheduled return appointment.

## 2015-06-03 NOTE — Patient Instructions (Addendum)
PLAN  c

## 2015-06-03 NOTE — Telephone Encounter (Signed)
Patient called and is at [facility] and has no refills for norflex and lyrica. Norflex e- scribed by Dr Primus Bravo. Lyrica called into [facility]. Lyrica 120m capsule, take 1 cap po bid to tid if tolerated. Quantity 90 with 2 refills.

## 2015-06-03 NOTE — Progress Notes (Signed)
Safety precautions to be maintained throughout the outpatient stay will include: orient to surroundings, keep bed in low position, maintain call bell within reach at all times, provide assistance with transfer out of bed and ambulation.  

## 2015-06-04 ENCOUNTER — Ambulatory Visit
Admission: RE | Admit: 2015-06-04 | Discharge: 2015-06-04 | Disposition: A | Discharge status: Home / Self Care | Source: Ambulatory Visit | Attending: Pain Medicine | Admitting: Pain Medicine

## 2015-06-04 DIAGNOSIS — M19011 Primary osteoarthritis, right shoulder: Secondary | ICD-10-CM | POA: Diagnosis present

## 2015-06-04 DIAGNOSIS — X58XXXD Exposure to other specified factors, subsequent encounter: Secondary | ICD-10-CM | POA: Insufficient documentation

## 2015-06-04 DIAGNOSIS — S46001D Unspecified injury of muscle(s) and tendon(s) of the rotator cuff of right shoulder, subsequent encounter: Secondary | ICD-10-CM | POA: Diagnosis present

## 2015-06-05 NOTE — Progress Notes (Signed)
Results read to patient as instructed by Dr. Primus Bravo. Patient agrees to injection.

## 2015-06-19 ENCOUNTER — Encounter: Payer: Self-pay | Admitting: Pain Medicine

## 2015-06-19 ENCOUNTER — Ambulatory Visit: Attending: Pain Medicine | Admitting: Pain Medicine

## 2015-06-19 VITALS — BP 112/86 | HR 94 | Temp 98.1°F | Resp 16 | Ht 69.0 in | Wt 230.0 lb

## 2015-06-19 DIAGNOSIS — M19119 Post-traumatic osteoarthritis, unspecified shoulder: Secondary | ICD-10-CM

## 2015-06-19 DIAGNOSIS — M75100 Unspecified rotator cuff tear or rupture of unspecified shoulder, not specified as traumatic: Secondary | ICD-10-CM

## 2015-06-19 DIAGNOSIS — M25512 Pain in left shoulder: Secondary | ICD-10-CM | POA: Diagnosis not present

## 2015-06-19 DIAGNOSIS — M170 Bilateral primary osteoarthritis of knee: Secondary | ICD-10-CM

## 2015-06-19 DIAGNOSIS — M25511 Pain in right shoulder: Secondary | ICD-10-CM | POA: Insufficient documentation

## 2015-06-19 DIAGNOSIS — M5416 Radiculopathy, lumbar region: Secondary | ICD-10-CM

## 2015-06-19 DIAGNOSIS — M75101 Unspecified rotator cuff tear or rupture of right shoulder, not specified as traumatic: Secondary | ICD-10-CM

## 2015-06-19 DIAGNOSIS — M7591 Shoulder lesion, unspecified, right shoulder: Secondary | ICD-10-CM | POA: Diagnosis not present

## 2015-06-19 DIAGNOSIS — Z9889 Other specified postprocedural states: Secondary | ICD-10-CM | POA: Diagnosis not present

## 2015-06-19 DIAGNOSIS — M5412 Radiculopathy, cervical region: Secondary | ICD-10-CM

## 2015-06-19 DIAGNOSIS — S43429D Sprain of unspecified rotator cuff capsule, subsequent encounter: Secondary | ICD-10-CM

## 2015-06-19 DIAGNOSIS — S43421D Sprain of right rotator cuff capsule, subsequent encounter: Secondary | ICD-10-CM

## 2015-06-19 DIAGNOSIS — M19012 Primary osteoarthritis, left shoulder: Secondary | ICD-10-CM

## 2015-06-19 DIAGNOSIS — M5030 Other cervical disc degeneration, unspecified cervical region: Secondary | ICD-10-CM

## 2015-06-19 DIAGNOSIS — M5481 Occipital neuralgia: Secondary | ICD-10-CM

## 2015-06-19 DIAGNOSIS — M533 Sacrococcygeal disorders, not elsewhere classified: Secondary | ICD-10-CM

## 2015-06-19 DIAGNOSIS — M19112 Post-traumatic osteoarthritis, left shoulder: Secondary | ICD-10-CM

## 2015-06-19 DIAGNOSIS — M19011 Primary osteoarthritis, right shoulder: Secondary | ICD-10-CM

## 2015-06-19 DIAGNOSIS — M47817 Spondylosis without myelopathy or radiculopathy, lumbosacral region: Secondary | ICD-10-CM

## 2015-06-19 DIAGNOSIS — M19111 Post-traumatic osteoarthritis, right shoulder: Secondary | ICD-10-CM

## 2015-06-19 MED ORDER — BUPIVACAINE HCL (PF) 0.25 % IJ SOLN
INTRAMUSCULAR | Status: AC
  Administered 2015-06-19: 30 mL
  Filled 2015-06-19: qty 30

## 2015-06-19 MED ORDER — METHYLPREDNISOLONE ACETATE 80 MG/ML IJ SUSP: 40.0000 mg | Freq: Once | INTRAMUSCULAR | Status: DC

## 2015-06-19 MED ORDER — ORPHENADRINE CITRATE 30 MG/ML IJ SOLN
INTRAMUSCULAR | Status: AC
  Administered 2015-06-19: 60 mg
  Filled 2015-06-19: qty 2

## 2015-06-19 MED ORDER — BUPIVACAINE HCL (PF) 0.5 % IJ SOLN: 30.0000 mL | Freq: Once | INTRAMUSCULAR | Status: DC

## 2015-06-19 MED ORDER — METHYLPREDNISOLONE ACETATE 40 MG/ML IJ SUSP
INTRAMUSCULAR | Status: AC
  Administered 2015-06-19: 40 mg
  Filled 2015-06-19: qty 1

## 2015-06-19 NOTE — Progress Notes (Signed)
Safety precautions to be maintained throughout the outpatient stay will include: orient to surroundings, keep bed in low position, maintain call bell within reach at all times, provide assistance with transfer out of bed and ambulation.  

## 2015-06-19 NOTE — Patient Instructions (Addendum)
PLAN  Continue present medication Lyrica Norflex and Nucynta  F/U PCP Dr.N Humphrey Rolls   for evaliation of  BP and general medical  condition  F/U surgical evaluation as discussed  F/U neurological evaluation as discussed  F/U Dr. Rosine Door as planned   F/U with ophthalmologist as discussed  May consider radiofrequency rhizolysis or intraspinal procedures pending response to present treatment and F/U evaluation   Patient to call [facility] should patient have concerns prior to scheduled return appointment.GENERAL RISKS AND COMPLICATIONS  What are the risk, side effects and possible complications? Generally speaking, most procedures are safe.  However, with any procedure there are risks, side effects, and the possibility of complications.  The risks and complications are dependent upon the sites that are lesioned, or the type of nerve block to be performed.  The closer the procedure is to the spine, the more serious the risks are.  Great care is taken when placing the radio frequency needles, block needles or lesioning probes, but sometimes complications can occur. 1. Infection: Any time there is an injection through the skin, there is a risk of infection.  This is why sterile conditions are used for these blocks.  There are four possible types of infection. 1. Localized skin infection. 2. Central Nervous System Infection-This can be in the form of Meningitis, which can be deadly. 3. Epidural Infections-This can be in the form of an epidural abscess, which can cause pressure inside of the spine, causing compression of the spinal cord with subsequent paralysis. This would require an emergency surgery to decompress, and there are no guarantees that the patient would recover from the paralysis. 4. Discitis-This is an infection of the intervertebral discs.  It occurs in about 1% of discography procedures.  It is difficult to treat and it may lead to surgery.        2. Pain: the needles have to  go through skin and soft tissues, will cause soreness.       3. Damage to internal structures:  The nerves to be lesioned may be near blood vessels or    other nerves which can be potentially damaged.       4. Bleeding: Bleeding is more common if the patient is taking blood thinners such as  aspirin, Coumadin, Ticiid, Plavix, etc., or if he/she have some genetic predisposition  such as hemophilia. Bleeding into the spinal canal can cause compression of the spinal  cord with subsequent paralysis.  This would require an emergency surgery to  decompress and there are no guarantees that the patient would recover from the  paralysis.       5. Pneumothorax:  Puncturing of a lung is a possibility, every time a needle is introduced in  the area of the chest or upper back.  Pneumothorax refers to free air around the  collapsed lung(s), inside of the thoracic cavity (chest cavity).  Another two possible  complications related to a similar event would include: Hemothorax and Chylothorax.   These are variations of the Pneumothorax, where instead of air around the collapsed  lung(s), you may have blood or chyle, respectively.       6. Spinal headaches: They may occur with any procedures in the area of the spine.       7. Persistent CSF (Cerebro-Spinal Fluid) leakage: This is a rare problem, but may occur  with prolonged intrathecal or epidural catheters either due to the formation of a fistulous  track or a dural tear.  8. Nerve damage: By working so close to the spinal cord, there is always a possibility of  nerve damage, which could be as serious as a permanent spinal cord injury with  paralysis.       9. Death:  Although rare, severe deadly allergic reactions known as "Anaphylactic  reaction" can occur to any of the medications used.      10. Worsening of the symptoms:  We can always make thing worse.  What are the chances of something like this happening? Chances of any of this occuring are extremely low.   By statistics, you have more of a chance of getting killed in a motor vehicle accident: while driving to the hospital than any of the above occurring .  Nevertheless, you should be aware that they are possibilities.  In general, it is similar to taking a shower.  Everybody knows that you can slip, hit your head and get killed.  Does that mean that you should not shower again?  Nevertheless always keep in mind that statistics do not mean anything if you happen to be on the wrong side of them.  Even if a procedure has a 1 (one) in a 1,000,000 (million) chance of going wrong, it you happen to be that one..Also, keep in mind that by statistics, you have more of a chance of having something go wrong when taking medications.  Who should not have this procedure? If you are on a blood thinning medication (e.g. Coumadin, Plavix, see list of "Blood Thinners"), or if you have an active infection going on, you should not have the procedure.  If you are taking any blood thinners, please inform your physician.  How should I prepare for this procedure?  Do not eat or drink anything at least six hours prior to the procedure.  Bring a driver with you .  It cannot be a taxi.  Come accompanied by an adult that can drive you back, and that is strong enough to help you if your legs get weak or numb from the local anesthetic.  Take all of your medicines the morning of the procedure with just enough water to swallow them.  If you have diabetes, make sure that you are scheduled to have your procedure done first thing in the morning, whenever possible.  If you have diabetes, take only half of your insulin dose and notify our nurse that you have done so as soon as you arrive at the clinic.  If you are diabetic, but only take blood sugar pills (oral hypoglycemic), then do not take them on the morning of your procedure.  You may take them after you have had the procedure.  Do not take aspirin or any aspirin-containing  medications, at least eleven (11) days prior to the procedure.  They may prolong bleeding.  Wear loose fitting clothing that may be easy to take off and that you would not mind if it got stained with Betadine or blood.  Do not wear any jewelry or perfume  Remove any nail coloring.  It will interfere with some of our monitoring equipment.  NOTE: Remember that this is not meant to be interpreted as a complete list of all possible complications.  Unforeseen problems may occur.  BLOOD THINNERS The following drugs contain aspirin or other products, which can cause increased bleeding during surgery and should not be taken for 2 weeks prior to and 1 week after surgery.  If you should need take something for relief of minor  pain, you may take acetaminophen which is found in Tylenol,m Datril, Anacin-3 and Panadol. It is not blood thinner. The products listed below are.  Do not take any of the products listed below in addition to any listed on your instruction sheet.  A.P.C or A.P.C with Codeine Codeine Phosphate Capsules #3 Ibuprofen Ridaura  ABC compound Congesprin Imuran rimadil  Advil Cope Indocin Robaxisal  Alka-Seltzer Effervescent Pain Reliever and Antacid Coricidin or Coricidin-D  Indomethacin Rufen  Alka-Seltzer plus Cold Medicine Cosprin Ketoprofen S-A-C Tablets  Anacin Analgesic Tablets or Capsules Coumadin Korlgesic Salflex  Anacin Extra Strength Analgesic tablets or capsules CP-2 Tablets Lanoril Salicylate  Anaprox Cuprimine Capsules Levenox Salocol  Anexsia-D Dalteparin Magan Salsalate  Anodynos Darvon compound Magnesium Salicylate Sine-off  Ansaid Dasin Capsules Magsal Sodium Salicylate  Anturane Depen Capsules Marnal Soma  APF Arthritis pain formula Dewitt's Pills Measurin Stanback  Argesic Dia-Gesic Meclofenamic Sulfinpyrazone  Arthritis Bayer Timed Release Aspirin Diclofenac Meclomen Sulindac  Arthritis pain formula Anacin Dicumarol Medipren Supac  Analgesic (Safety coated)  Arthralgen Diffunasal Mefanamic Suprofen  Arthritis Strength Bufferin Dihydrocodeine Mepro Compound Suprol  Arthropan liquid Dopirydamole Methcarbomol with Aspirin Synalgos  ASA tablets/Enseals Disalcid Micrainin Tagament  Ascriptin Doan's Midol Talwin  Ascriptin A/D Dolene Mobidin Tanderil  Ascriptin Extra Strength Dolobid Moblgesic Ticlid  Ascriptin with Codeine Doloprin or Doloprin with Codeine Momentum Tolectin  Asperbuf Duoprin Mono-gesic Trendar  Aspergum Duradyne Motrin or Motrin IB Triminicin  Aspirin plain, buffered or enteric coated Durasal Myochrisine Trigesic  Aspirin Suppositories Easprin Nalfon Trillsate  Aspirin with Codeine Ecotrin Regular or Extra Strength Naprosyn Uracel  Atromid-S Efficin Naproxen Ursinus  Auranofin Capsules Elmiron Neocylate Vanquish  Axotal Emagrin Norgesic Verin  Azathioprine Empirin or Empirin with Codeine Normiflo Vitamin E  Azolid Emprazil Nuprin Voltaren  Bayer Aspirin plain, buffered or children's or timed BC Tablets or powders Encaprin Orgaran Warfarin Sodium  Buff-a-Comp Enoxaparin Orudis Zorpin  Buff-a-Comp with Codeine Equegesic Os-Cal-Gesic   Buffaprin Excedrin plain, buffered or Extra Strength Oxalid   Bufferin Arthritis Strength Feldene Oxphenbutazone   Bufferin plain or Extra Strength Feldene Capsules Oxycodone with Aspirin   Bufferin with Codeine Fenoprofen Fenoprofen Pabalate or Pabalate-SF   Buffets II Flogesic Panagesic   Buffinol plain or Extra Strength Florinal or Florinal with Codeine Panwarfarin   Buf-Tabs Flurbiprofen Penicillamine   Butalbital Compound Four-way cold tablets Penicillin   Butazolidin Fragmin Pepto-Bismol   Carbenicillin Geminisyn Percodan   Carna Arthritis Reliever Geopen Persantine   Carprofen Gold's salt Persistin   Chloramphenicol Goody's Phenylbutazone   Chloromycetin Haltrain Piroxlcam   Clmetidine heparin Plaquenil   Cllnoril Hyco-pap Ponstel   Clofibrate Hydroxy chloroquine Propoxyphen          Before stopping any of these medications, be sure to consult the physician who ordered them.  Some, such as Coumadin (Warfarin) are ordered to prevent or treat serious conditions such as "deep thrombosis", "pumonary embolisms", and other heart problems.  The amount of time that you may need off of the medication may also vary with the medication and the reason for which you were taking it.  If you are taking any of these medications, please make sure you notify your pain physician before you undergo any procedures.

## 2015-06-19 NOTE — Progress Notes (Signed)
   Subjective:    Patient ID: Vickie Stevens, female    DOB: [birth year], 44 y.o.   MRN: [id number]  HPI                                             RIGHT SHOULDER INJECTION   The patient is a 44 year old female who returns to pain management for further evaluation and treatment of pain involving the shoulders with pain of the cervical region thoracic and lumbar regions as well. The patient is with known rotator cuff injury of the left and right shoulders with prior surgery of the left shoulder. 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 The patient is a severely debilitating pain of the right shoulder. The risks benefits and expectations of procedure have been discussed and explained to patient who is understanding and wishes to proceed with right shoulder injection in attempt to decrease severity of patient's symptoms, minimize progression of symptoms, and avoid the need for more involved treatment. Decision has been made to proceed with right shoulder injection which is felt to be medically necessary procedure.   Description Of Procedure:  Right Shoulder Injection  The patient was monitored with EKG, blood pressure, pulse, and pulse oximetry monitoring. The patient assumed the sitting position. Betadine prep of proposed entry site was accomplished. Following identification of landmarks for right shoulder injection the procedure was performed.  Right Shoulder Injection ( Anterior Approach) With the patient in sitting position and following Betadine prep of proposed  entry site and identification of landmarks for right shoulder injection anterior approach a 22-gauge needle was inserted in the region of the right shoulder anteriorly and 2 cc of 0.25% bupivacaine with Depo-Medrol was injected for right shoulder injection anterior approach  Right Shoulder Injection( Posterior Approach) With the patient in the sitting position and following Betadine prep of  proposed entry site Any identification of landmarks for right shoulder injection posterior approach a 22-gauge needle was inserted in the region of the right shoulder posteriorly and 2 cc of 0.25% bupivacaine with Depo-Medrol was injected for right shoulder injection posterior approach  Myoneural block injections of the trapezius musculature region Following Betadine prep of proposed entry site a 22-gauge needle was inserted into trapezius musculature region and following negative aspiration 2 cc of 0.25% bupivacaine with Norflex was injected for myoneural block injections of the trapezius musculature region 4  The patient tolerated procedure well  A total of 40 mg of Kenalog was utilized for the entire procedure     PLAN    Continue present medication Lyrica Norflex and Nucynta  F/U PCP Dr.N Humphrey Rolls   for evaliation of  BP and general medical  condition  F/U surgical evaluation as discussed  F/U neurological evaluation as discussed  F/U Dr. Rosine Door as planned   F/U with ophthalmologist as discussed  May consider radiofrequency rhizolysis or intraspinal procedures pending response to present treatment and F/U evaluation   Patient to call [facility] should patient have concerns prior to scheduled return appointment.  Review of Systems     Objective:   Physical Exam        Assessment & Plan:

## 2015-06-20 ENCOUNTER — Telehealth: Payer: Self-pay | Admitting: *Deleted

## 2015-06-20 NOTE — Telephone Encounter (Signed)
Spoke with patient on yesterday re; Procedure, denies any problems or concerns.

## 2015-07-02 ENCOUNTER — Encounter: Admitting: Pain Medicine

## 2015-07-05 ENCOUNTER — Ambulatory Visit
Admission: RE | Admit: 2015-07-05 | Discharge: 2015-07-05 | Disposition: A | Discharge status: Home / Self Care | Payer: Worker's Compensation | Source: Ambulatory Visit | Attending: Obstetrics and Gynecology | Admitting: Obstetrics and Gynecology

## 2015-07-05 DIAGNOSIS — Z029 Encounter for administrative examinations, unspecified: Secondary | ICD-10-CM | POA: Insufficient documentation

## 2015-07-05 DIAGNOSIS — N63 Unspecified lump in breast: Secondary | ICD-10-CM | POA: Diagnosis present

## 2015-07-05 DIAGNOSIS — N6002 Solitary cyst of left breast: Secondary | ICD-10-CM

## 2015-07-05 DIAGNOSIS — N6001 Solitary cyst of right breast: Secondary | ICD-10-CM

## 2015-07-05 DIAGNOSIS — R921 Mammographic calcification found on diagnostic imaging of breast: Secondary | ICD-10-CM | POA: Diagnosis present

## 2015-07-10 ENCOUNTER — Encounter: Payer: Self-pay | Admitting: Pain Medicine

## 2015-07-10 ENCOUNTER — Ambulatory Visit: Attending: Pain Medicine | Admitting: Pain Medicine

## 2015-07-10 ENCOUNTER — Other Ambulatory Visit: Payer: Self-pay | Admitting: Pain Medicine

## 2015-07-10 VITALS — BP 134/94 | HR 92 | Temp 98.3°F | Resp 16 | Ht 69.0 in | Wt 228.0 lb

## 2015-07-10 DIAGNOSIS — M75100 Unspecified rotator cuff tear or rupture of unspecified shoulder, not specified as traumatic: Secondary | ICD-10-CM

## 2015-07-10 DIAGNOSIS — M461 Sacroiliitis, not elsewhere classified: Secondary | ICD-10-CM | POA: Insufficient documentation

## 2015-07-10 DIAGNOSIS — M5481 Occipital neuralgia: Secondary | ICD-10-CM | POA: Diagnosis not present

## 2015-07-10 DIAGNOSIS — M7060 Trochanteric bursitis, unspecified hip: Secondary | ICD-10-CM | POA: Insufficient documentation

## 2015-07-10 DIAGNOSIS — M19112 Post-traumatic osteoarthritis, left shoulder: Secondary | ICD-10-CM

## 2015-07-10 DIAGNOSIS — M62830 Muscle spasm of back: Secondary | ICD-10-CM | POA: Diagnosis present

## 2015-07-10 DIAGNOSIS — M5030 Other cervical disc degeneration, unspecified cervical region: Secondary | ICD-10-CM | POA: Diagnosis not present

## 2015-07-10 DIAGNOSIS — Z9889 Other specified postprocedural states: Secondary | ICD-10-CM | POA: Diagnosis not present

## 2015-07-10 DIAGNOSIS — R51 Headache: Secondary | ICD-10-CM | POA: Insufficient documentation

## 2015-07-10 DIAGNOSIS — M19011 Primary osteoarthritis, right shoulder: Secondary | ICD-10-CM | POA: Diagnosis not present

## 2015-07-10 DIAGNOSIS — M533 Sacrococcygeal disorders, not elsewhere classified: Secondary | ICD-10-CM | POA: Insufficient documentation

## 2015-07-10 DIAGNOSIS — M19119 Post-traumatic osteoarthritis, unspecified shoulder: Secondary | ICD-10-CM

## 2015-07-10 DIAGNOSIS — M47817 Spondylosis without myelopathy or radiculopathy, lumbosacral region: Secondary | ICD-10-CM

## 2015-07-10 DIAGNOSIS — M170 Bilateral primary osteoarthritis of knee: Secondary | ICD-10-CM

## 2015-07-10 DIAGNOSIS — M19012 Primary osteoarthritis, left shoulder: Secondary | ICD-10-CM | POA: Insufficient documentation

## 2015-07-10 DIAGNOSIS — S43421D Sprain of right rotator cuff capsule, subsequent encounter: Secondary | ICD-10-CM

## 2015-07-10 DIAGNOSIS — M19111 Post-traumatic osteoarthritis, right shoulder: Secondary | ICD-10-CM

## 2015-07-10 DIAGNOSIS — M5412 Radiculopathy, cervical region: Secondary | ICD-10-CM

## 2015-07-10 DIAGNOSIS — M5416 Radiculopathy, lumbar region: Secondary | ICD-10-CM

## 2015-07-10 DIAGNOSIS — S43429D Sprain of unspecified rotator cuff capsule, subsequent encounter: Secondary | ICD-10-CM

## 2015-07-10 DIAGNOSIS — M75101 Unspecified rotator cuff tear or rupture of right shoulder, not specified as traumatic: Secondary | ICD-10-CM

## 2015-07-10 MED ORDER — TAPENTADOL HCL 50 MG PO TABS: ORAL_TABLET | ORAL | Status: DC

## 2015-07-10 MED ORDER — ORPHENADRINE CITRATE ER 100 MG PO TB12: ORAL_TABLET | ORAL | Status: DC

## 2015-07-10 NOTE — Progress Notes (Signed)
Subjective:    Patient ID: Vickie Stevens, female    DOB: Apr 03, 1971, 45 y.o.   MRN: [id number]  HPI  The patient is a 45 year old female who returns to pain management for further evaluation and treatment of pain consisting of headaches as well as severe spasms occurring in the cervical thoracic and lumbar regions. The patient is with severely limited range of motion of the upper extremities. The patient is status post surgery of the left shoulder. Prior studies have also reveal patient to be with significant abnormalities of the right shoulder with there having been mentioning of a tear in the region of the right shoulder on previous studies. On today's visit we discussed patient's condition including patient's follow-up evaluation with surgeon as well as with Dr. Rosine Door. Patient has also discussed undergoing treatment at a facility in [place] where she would be an inpatient. At the present time we will continue present treatment with present medications and will proceed with greater occipital nerve block for headaches to be performed at time return appointment. For myoneural block injection cervical region of severe spasms of the cervical region which appear to be contributing to patient's symptomatology to significant degree as well. The patient was with understanding and agreed to suggested treatment plan.           l Review of Systems     Objective:   Physical Exam  There was tends to palpation of the paraspinal musculature region of the cervical region cervical facet region left side greater than the right with evidence of significant muscle spasms of the trapezius musculature region on the left compared to the right. There was severe tenderness to palpation of the splenius capitis and occipitalis musculature regions as well as the paraspinal muscular treat the cervical region. There was tenderness to palpation of the acromioclavicular and glenohumeral joint region with patient being  with decreased grip strength with severely limited range of motion of the left and right shoulders. There was tenderness to palpation over the thoracic facet thoracic paraspinal musculature region with no crepitus of the thoracic region noted. Palpation over the lumbar paraspinal must reason lumbar facet region was with moderate tends to palpation with lateral bending rotation extension and palpation of the lumbar facets reproducing moderate discomfort. There was moderate tenderness to palpation over the PSIS and PII S region as well as the gluteal and piriformis musculature regions. There was moderate tenderness to palpation of the greater trochanteric region and iliotibial band region. Straight leg raise was tolerates approximately 30 without increased pain with dorsiflexion noted. EHL strength appeared to be slightly decreased. There was no definite sensory deficit or dermatomal distribution detected. There was negative clonus negative Homans. Abdomen was nontender with no costovertebral tenderness noted.                 Assessment & Plan:      Bilateral occipital neuralgia  Degenerative joint disease of shoulders  Rotator cuff tear of shoulder Status post surgical intervention of the shoulder  Degenerative disc disease cervical spine  Sacroiliitis sacroiliac joint dysfunction  Greater trochanteric bursitis     PLAN  Continue present medication Lyrica Norflex and Nucynta  Greater occipital nerve block to be performed at time of return appointment  F/U PCP Dr.N Humphrey Rolls   for evaliation of  BP and general medical  condition  F/U surgical evaluation as discussed  F/U neurological evaluation as discussed  Continue TENS unit use as discussed  F/U Dr. Rosine Door as planned  F/U with ophthalmologist as discussed  May consider radiofrequency rhizolysis or intraspinal procedures pending response to present treatment and F/U evaluation   Patient to call Pain Management  Center should patient have concerns prior to scheduled return appointment.

## 2015-07-10 NOTE — Progress Notes (Signed)
Safety precautions to be maintained throughout the outpatient stay will include: orient to surroundings, keep bed in low position, maintain call bell within reach at all times, provide assistance with transfer out of bed and ambulation.  

## 2015-07-10 NOTE — Patient Instructions (Addendum)
PLAN  Continue present medication Lyrica Norflex and Nucynta  Greater occipital nerve block to be performed at time of return appointment  F/U PCP Dr.N Humphrey Rolls   for evaliation of  BP and general medical  condition  F/U surgical evaluation as discussed  F/U neurological evaluation as discussed  Continue TENS unit use as discussed  F/U Dr. Rosine Door as planned   F/U with ophthalmologist as discussed  May consider radiofrequency rhizolysis or intraspinal procedures pending response to present treatment and F/U evaluation   Patient to call Pain Management Center should patient have concerns prior to scheduled return appointment.Occipital Nerve Block Patient Information  Description: The occipital nerves originate in the cervical (neck) spinal cord and travel upward through muscle and tissue to supply sensation to the back of the head and top of the scalp.  In addition, the nerves control some of the muscles of the scalp.  Occipital neuralgia is an irritation of these nerves which can cause headaches, numbness of the scalp, and neck discomfort.     The occipital nerve block will interrupt nerve transmission through these nerves and can relieve pain and spasm.  The block consists of insertion of a small needle under the skin in the back of the head to deposit local anesthetic (numbing medicine) and/or steroids around the nerve.  The entire block usually lasts less than 5 minutes.  Conditions which may be treated by occipital blocks:   Muscular pain and spasm of the scalp  Nerve irritation, back of the head  Headaches  Upper neck pain  Preparation for the injection:  1. Do not eat any solid food or dairy products within 6 hours of your appointment. 2. You may drink clear liquids up to 2 hours before appointment.  Clear liquids include water, black coffee, juice or soda.  No milk or cream please. 3. You may take your regular medication, including pain medications, with a sip of water  before you appointment.  Diabetics should hold regular insulin (if taken separately) and take 1/2 normal NPH dose the morning of the procedure.  Carry some sugar containing items with you to your appointment. 4. A driver must accompany you and be prepared to drive you home after your procedure. 5. Bring all your current medications with you. 6. An IV may be inserted and sedation may be given at the discretion of the physician. 7. A blood pressure cuff, EKG, and other monitors will often be applied during the procedure.  Some patients may need to have extra oxygen administered for a short period. 8. You will be asked to provide medical information, including your allergies and medications, prior to the procedure.  We must know immediately if you are taking blood thinners (like Coumadin/Warfarin) or if you are allergic to IV iodine contrast (dye).  We must know if you could possible be pregnant.  9. Do not wear a high collared shirt or turtleneck.  Tie long hair up in the back if possible.  Possible side-effects:   Bleeding from needle site  Infection (rare, may require surgery)  Nerve injury (rare)  Hair on back of neck can be tinged with iodine scrub (this will wash out)  Light-headedness (temporary)  Pain at injection site (several days)  Decreased blood pressure (rare, temporary)  Seizure (very rare)  Call if you experience:   Hives or difficulty breathing ( go to the emergency room)  Inflammation or drainage at the injection site(s)  Please note:  Although the local anesthetic injected can often  make your painful muscles or headache feel good for several hours after the injection, the pain may return.  It takes 3-7 days for steroids to work.  You may not notice any pain relief for at least one week.  If effective, we will often do a series of injections spaced 3-6 weeks apart to maximally decrease your pain.  If you have any questions, please call [phone] [place]  What are the risk, side effects and possible complications? Generally speaking, most procedures are safe.  However, with any procedure there are risks, side effects, and the possibility of complications.  The risks and complications are dependent upon the sites that are lesioned, or the type of nerve block to be performed.  The closer the procedure is to the spine, the more serious the risks are.  Great care is taken when placing the radio frequency needles, block needles or lesioning probes, but sometimes complications can occur. 1. Infection: Any time there is an injection through the skin, there is a risk of infection.  This is why sterile conditions are used for these blocks.  There are four possible types of infection. 1. Localized skin infection. 2. Central Nervous System Infection-This can be in the form of Meningitis, which can be deadly. 3. Epidural Infections-This can be in the form of an epidural abscess, which can cause pressure inside of the spine, causing compression of the spinal cord with subsequent paralysis. This would require an emergency surgery to decompress, and there are no guarantees that the patient would recover from the paralysis. 4. Discitis-This is an infection of the intervertebral discs.  It occurs in about 1% of discography procedures.  It is difficult to treat and it may lead to surgery.        2. Pain: the needles have to go through skin and soft tissues, will cause soreness.       3. Damage to internal structures:  The nerves to be lesioned may be near blood vessels or    other nerves which can be potentially damaged.       4. Bleeding: Bleeding is more common if the patient is taking blood thinners such as  aspirin, Coumadin, Ticiid, Plavix, etc., or if he/she have some genetic predisposition  such as hemophilia. Bleeding into the spinal canal can cause compression of the spinal  cord with  subsequent paralysis.  This would require an emergency surgery to  decompress and there are no guarantees that the patient would recover from the  paralysis.       5. Pneumothorax:  Puncturing of a lung is a possibility, every time a needle is introduced in  the area of the chest or upper back.  Pneumothorax refers to free air around the  collapsed lung(s), inside of the thoracic cavity (chest cavity).  Another two possible  complications related to a similar event would include: Hemothorax and Chylothorax.   These are variations of the Pneumothorax, where instead of air around the collapsed  lung(s), you may have blood or chyle, respectively.       6. Spinal headaches: They may occur with any procedures in the area of the spine.       7. Persistent CSF (Cerebro-Spinal Fluid) leakage: This is a rare problem, but may occur  with prolonged intrathecal or epidural catheters either due to the formation of a fistulous  track or a dural tear.       8. Nerve  damage: By working so close to the spinal cord, there is always a possibility of  nerve damage, which could be as serious as a permanent spinal cord injury with  paralysis.       9. Death:  Although rare, severe deadly allergic reactions known as "Anaphylactic  reaction" can occur to any of the medications used.      10. Worsening of the symptoms:  We can always make thing worse.  What are the chances of something like this happening? Chances of any of this occuring are extremely low.  By statistics, you have more of a chance of getting killed in a motor vehicle accident: while driving to the hospital than any of the above occurring .  Nevertheless, you should be aware that they are possibilities.  In general, it is similar to taking a shower.  Everybody knows that you can slip, hit your head and get killed.  Does that mean that you should not shower again?  Nevertheless always keep in mind that statistics do not mean anything if you happen to be on the wrong  side of them.  Even if a procedure has a 1 (one) in a 1,000,000 (million) chance of going wrong, it you happen to be that one..Also, keep in mind that by statistics, you have more of a chance of having something go wrong when taking medications.  Who should not have this procedure? If you are on a blood thinning medication (e.g. Coumadin, Plavix, see list of "Blood Thinners"), or if you have an active infection going on, you should not have the procedure.  If you are taking any blood thinners, please inform your physician.  How should I prepare for this procedure?  Do not eat or drink anything at least six hours prior to the procedure.  Bring a driver with you .  It cannot be a taxi.  Come accompanied by an adult that can drive you back, and that is strong enough to help you if your legs get weak or numb from the local anesthetic.  Take all of your medicines the morning of the procedure with just enough water to swallow them.  If you have diabetes, make sure that you are scheduled to have your procedure done first thing in the morning, whenever possible.  If you have diabetes, take only half of your insulin dose and notify our nurse that you have done so as soon as you arrive at the clinic.  If you are diabetic, but only take blood sugar pills (oral hypoglycemic), then do not take them on the morning of your procedure.  You may take them after you have had the procedure.  Do not take aspirin or any aspirin-containing medications, at least eleven (11) days prior to the procedure.  They may prolong bleeding.  Wear loose fitting clothing that may be easy to take off and that you would not mind if it got stained with Betadine or blood.  Do not wear any jewelry or perfume  Remove any nail coloring.  It will interfere with some of our monitoring equipment.  NOTE: Remember that this is not meant to be interpreted as a complete list of all possible complications.  Unforeseen problems may  occur.  BLOOD THINNERS The following drugs contain aspirin or other products, which can cause increased bleeding during surgery and should not be taken for 2 weeks prior to and 1 week after surgery.  If you should need take something for relief of minor pain, you  may take acetaminophen which is found in Tylenol,m Datril, Anacin-3 and Panadol. It is not blood thinner. The products listed below are.  Do not take any of the products listed below in addition to any listed on your instruction sheet.  A.P.C or A.P.C with Codeine Codeine Phosphate Capsules #3 Ibuprofen Ridaura  ABC compound Congesprin Imuran rimadil  Advil Cope Indocin Robaxisal  Alka-Seltzer Effervescent Pain Reliever and Antacid Coricidin or Coricidin-D  Indomethacin Rufen  Alka-Seltzer plus Cold Medicine Cosprin Ketoprofen S-A-C Tablets  Anacin Analgesic Tablets or Capsules Coumadin Korlgesic Salflex  Anacin Extra Strength Analgesic tablets or capsules CP-2 Tablets Lanoril Salicylate  Anaprox Cuprimine Capsules Levenox Salocol  Anexsia-D Dalteparin Magan Salsalate  Anodynos Darvon compound Magnesium Salicylate Sine-off  Ansaid Dasin Capsules Magsal Sodium Salicylate  Anturane Depen Capsules Marnal Soma  APF Arthritis pain formula Dewitt's Pills Measurin Stanback  Argesic Dia-Gesic Meclofenamic Sulfinpyrazone  Arthritis Bayer Timed Release Aspirin Diclofenac Meclomen Sulindac  Arthritis pain formula Anacin Dicumarol Medipren Supac  Analgesic (Safety coated) Arthralgen Diffunasal Mefanamic Suprofen  Arthritis Strength Bufferin Dihydrocodeine Mepro Compound Suprol  Arthropan liquid Dopirydamole Methcarbomol with Aspirin Synalgos  ASA tablets/Enseals Disalcid Micrainin Tagament  Ascriptin Doan's Midol Talwin  Ascriptin A/D Dolene Mobidin Tanderil  Ascriptin Extra Strength Dolobid Moblgesic Ticlid  Ascriptin with Codeine Doloprin or Doloprin with Codeine Momentum Tolectin  Asperbuf Duoprin Mono-gesic Trendar  Aspergum Duradyne  Motrin or Motrin IB Triminicin  Aspirin plain, buffered or enteric coated Durasal Myochrisine Trigesic  Aspirin Suppositories Easprin Nalfon Trillsate  Aspirin with Codeine Ecotrin Regular or Extra Strength Naprosyn Uracel  Atromid-S Efficin Naproxen Ursinus  Auranofin Capsules Elmiron Neocylate Vanquish  Axotal Emagrin Norgesic Verin  Azathioprine Empirin or Empirin with Codeine Normiflo Vitamin E  Azolid Emprazil Nuprin Voltaren  Bayer Aspirin plain, buffered or children's or timed BC Tablets or powders Encaprin Orgaran Warfarin Sodium  Buff-a-Comp Enoxaparin Orudis Zorpin  Buff-a-Comp with Codeine Equegesic Os-Cal-Gesic   Buffaprin Excedrin plain, buffered or Extra Strength Oxalid   Bufferin Arthritis Strength Feldene Oxphenbutazone   Bufferin plain or Extra Strength Feldene Capsules Oxycodone with Aspirin   Bufferin with Codeine Fenoprofen Fenoprofen Pabalate or Pabalate-SF   Buffets II Flogesic Panagesic   Buffinol plain or Extra Strength Florinal or Florinal with Codeine Panwarfarin   Buf-Tabs Flurbiprofen Penicillamine   Butalbital Compound Four-way cold tablets Penicillin   Butazolidin Fragmin Pepto-Bismol   Carbenicillin Geminisyn Percodan   Carna Arthritis Reliever Geopen Persantine   Carprofen Gold's salt Persistin   Chloramphenicol Goody's Phenylbutazone   Chloromycetin Haltrain Piroxlcam   Clmetidine heparin Plaquenil   Cllnoril Hyco-pap Ponstel   Clofibrate Hydroxy chloroquine Propoxyphen         Before stopping any of these medications, be sure to consult the physician who ordered them.  Some, such as Coumadin (Warfarin) are ordered to prevent or treat serious conditions such as "deep thrombosis", "pumonary embolisms", and other heart problems.  The amount of time that you may need off of the medication may also vary with the medication and the reason for which you were taking it.  If you are taking any of these medications, please make sure you notify your pain  physician before you undergo any procedures.

## 2015-07-11 LAB — TRAMADOL SCREEN, URINE: TRAMADOL UR QL SCN: NEGATIVE ng/mL

## 2015-07-12 LAB — TAPENTADOL, URINE: Tapentadol, Urine: POSITIVE ng/mL — AB

## 2015-07-15 LAB — TOXASSURE SELECT 13 (MW), URINE: PDF: 0

## 2015-08-07 ENCOUNTER — Ambulatory Visit: Payer: Federal, State, Local not specified - PPO | Attending: Pain Medicine | Admitting: Pain Medicine

## 2015-08-07 ENCOUNTER — Encounter: Payer: Self-pay | Admitting: Pain Medicine

## 2015-08-07 VITALS — BP 138/105 | HR 99 | Temp 98.2°F | Resp 18 | Ht 68.0 in | Wt 230.0 lb

## 2015-08-07 DIAGNOSIS — S43421D Sprain of right rotator cuff capsule, subsequent encounter: Secondary | ICD-10-CM

## 2015-08-07 DIAGNOSIS — M461 Sacroiliitis, not elsewhere classified: Secondary | ICD-10-CM | POA: Insufficient documentation

## 2015-08-07 DIAGNOSIS — M19119 Post-traumatic osteoarthritis, unspecified shoulder: Secondary | ICD-10-CM

## 2015-08-07 DIAGNOSIS — M19111 Post-traumatic osteoarthritis, right shoulder: Secondary | ICD-10-CM

## 2015-08-07 DIAGNOSIS — M7060 Trochanteric bursitis, unspecified hip: Secondary | ICD-10-CM | POA: Insufficient documentation

## 2015-08-07 DIAGNOSIS — M75100 Unspecified rotator cuff tear or rupture of unspecified shoulder, not specified as traumatic: Secondary | ICD-10-CM

## 2015-08-07 DIAGNOSIS — M75101 Unspecified rotator cuff tear or rupture of right shoulder, not specified as traumatic: Secondary | ICD-10-CM

## 2015-08-07 DIAGNOSIS — F419 Anxiety disorder, unspecified: Secondary | ICD-10-CM | POA: Insufficient documentation

## 2015-08-07 DIAGNOSIS — F329 Major depressive disorder, single episode, unspecified: Secondary | ICD-10-CM | POA: Insufficient documentation

## 2015-08-07 DIAGNOSIS — M47817 Spondylosis without myelopathy or radiculopathy, lumbosacral region: Secondary | ICD-10-CM

## 2015-08-07 DIAGNOSIS — S43429D Sprain of unspecified rotator cuff capsule, subsequent encounter: Secondary | ICD-10-CM

## 2015-08-07 DIAGNOSIS — M797 Fibromyalgia: Secondary | ICD-10-CM | POA: Insufficient documentation

## 2015-08-07 DIAGNOSIS — M19012 Primary osteoarthritis, left shoulder: Secondary | ICD-10-CM | POA: Insufficient documentation

## 2015-08-07 DIAGNOSIS — M5416 Radiculopathy, lumbar region: Secondary | ICD-10-CM

## 2015-08-07 DIAGNOSIS — M19112 Post-traumatic osteoarthritis, left shoulder: Secondary | ICD-10-CM

## 2015-08-07 DIAGNOSIS — M5412 Radiculopathy, cervical region: Secondary | ICD-10-CM

## 2015-08-07 DIAGNOSIS — F32A Depression, unspecified: Secondary | ICD-10-CM

## 2015-08-07 DIAGNOSIS — M170 Bilateral primary osteoarthritis of knee: Secondary | ICD-10-CM

## 2015-08-07 DIAGNOSIS — M533 Sacrococcygeal disorders, not elsewhere classified: Secondary | ICD-10-CM | POA: Insufficient documentation

## 2015-08-07 DIAGNOSIS — M5481 Occipital neuralgia: Secondary | ICD-10-CM

## 2015-08-07 DIAGNOSIS — Z9889 Other specified postprocedural states: Secondary | ICD-10-CM | POA: Insufficient documentation

## 2015-08-07 DIAGNOSIS — M5030 Other cervical disc degeneration, unspecified cervical region: Secondary | ICD-10-CM

## 2015-08-07 DIAGNOSIS — F4323 Adjustment disorder with mixed anxiety and depressed mood: Secondary | ICD-10-CM

## 2015-08-07 DIAGNOSIS — M19011 Primary osteoarthritis, right shoulder: Secondary | ICD-10-CM | POA: Insufficient documentation

## 2015-08-07 MED ORDER — PREGABALIN 150 MG PO CAPS: ORAL_CAPSULE | ORAL | Status: DC

## 2015-08-07 MED ORDER — ORPHENADRINE CITRATE ER 100 MG PO TB12: ORAL_TABLET | ORAL | Status: DC

## 2015-08-07 MED ORDER — TAPENTADOL HCL 50 MG PO TABS: ORAL_TABLET | ORAL | Status: DC

## 2015-08-07 NOTE — Progress Notes (Signed)
Safety precautions to be maintained throughout the outpatient stay will include: orient to surroundings, keep bed in low position, maintain call bell within reach at all times, provide assistance with transfer out of bed and ambulation.  Dr Primus Bravo talked to pt about decreasing meds or going into a detox program- states cost too much

## 2015-08-07 NOTE — Patient Instructions (Addendum)
PLAN  Continue present medication Lyrica Norflex and Nucynta and attempt to taper Nucynta as discussed. Call to discuss any concerns you may have probably attempting to taper the Nucynta  F/U PCP Dr.N Humphrey Rolls for evaliation of blood pressure as discussed. Need to see primary care physician today for elevated blood pressure go to emergency room for evaluation  Continue TENS unit use as discussed  F/U Dr. Rosine Door as planned   F/U with ophthalmologist as discussed  May consider radiofrequency rhizolysis or intraspinal procedures pending response to present treatment and F/U evaluation   Patient to call Pain Management Center should patient have concerns prior to scheduled return appointmentPain Management Discharge Instructions  General Discharge Instructions :  If you need to reach your doctor call: Monday-Friday 8:00 am - 4:00 pm at [phone] or toll free [phone].  After clinic hours [phone] to have operator reach doctor.  Bring all of your medication bottles to all your appointments in the pain clinic.  To cancel or reschedule your appointment with Pain Management please remember to call 24 hours in advance to avoid a fee.  Refer to the educational materials which you have been given on: General Risks, I had my Procedure. Discharge Instructions, Post Sedation.  Post Procedure Instructions:  The drugs you were given will stay in your system until tomorrow, so for the next 24 hours you should not drive, make any legal decisions or drink any alcoholic beverages.  You may eat anything you prefer, but it is better to start with liquids then soups and crackers, and gradually work up to solid foods.  Please notify your doctor immediately if you have any unusual bleeding, trouble breathing or pain that is not related to your normal pain.  Depending on the type of procedure that was done, some parts of your body may feel week and/or numb.  This usually clears up by tonight or the  next day.  Walk with the use of an assistive device or accompanied by an adult for the 24 hours.  You may use ice on the affected area for the first 24 hours.  Put ice in a Ziploc bag and cover with a towel and place against area 15 minutes on 15 minutes off.  You may switch to heat after 24 hours.

## 2015-08-07 NOTE — Progress Notes (Signed)
Subjective:    Patient ID: Vickie Stevens, female    DOB: 06/24/1971, 45 y.o.   MRN: [id number]  HPI  The patient is a 45 year old female who returns to pain management for further evaluation and treatment of pain involving the neck upper extremity region especially the shoulders and entire back and lower extremities on today's visit we discussed patient's condition. There has been a change regarding patient's benefits and insurance carriers. The patient states that she may have significant difficulty trying to pay for medications. We discussed modifying patient's treatment regimen in terms of placing medications as well as tapering of medications. At the present time patient will attempt to taper her medications especially Nucynta and we will consider replacing Nucynta with another medication are side upon other modifications of treatment regimen pending patient response to treatment and follow-up evaluation. Patient states that she is continuing to attempt to obtain assistance with helping her navigate through 4 has been a very difficult process of trying to obtain evaluations as well as additional studies in attempt to support her condition and of pain benefits which patient has been seeking. We informed patient that we remain available to assist with her condition and that we would provide additional information to anyone in attempt to facilitate her condition as well.. We have advised patient to follow with her primary care physician for evaluation and treatment of her elevated blood pressure which was noted on today's visit to go to the emergency room for evaluation and treatment of significantly elevated blood pressure as well. We'll also discussed patient undergoing follow up evaluation with Dr. Rosine Door and continued evaluations in this regard as well as continued evaluation with primary care physician. The patient was in agreement suggested treatment plan       Review of Systems      Objective:   Physical Exam  There was tenderness of the splenius capitis and occipitalis musculature regions of moderate degree. There was severe tenderness to palpation over the acromioclavicular and glenohumeral joint region left greater than the right with severely limited range of motion of the left upper extremity with inability to perform range of motion maneuvers to 90 the patient was unable toABduct the left upper extremity to 90. There was decreased grip strength. Tinel and Phalen's maneuver were with mild discomfort. There was significantly decreased grip strength. There appeared to be unremarkable Spurling's maneuver were tenderness to palpation along the paraspinal misreading the cervical region cervical facet region thoracic region thoracic facet region all of which be produced moderate to moderately severe discomfort. There was no crepitus of the thoracic region noted. Palpation over the lumbar paraspinal must reason lumbar facet region was attends to palpation of moderate degree to moderately severe degree. Lateral bending rotation extension and palpation of the lumbar facets reproduced moderate to moderately severe discomfort. Straight leg raise was tolerates approximately 20 without a definite increase of pain with dorsiflexion noted. There was negative clonus negative Homans. DTRs were difficult to elicit. There was moderate to moderately severe tenderness to palpation of the PSIS and PII S regions There was moderate tenderness over the greater trochanteric region and iliotibial band region. No definite sensory deficit or dermatomal distribution detected. There was negative clonus negative Homans. Abdomen was nontender with no costovertebral tenderness noted.     Assessment & Plan:     Bilateral occipital neuralgia  Degenerative joint disease of shoulders  Rotator cuff tear of shoulders Status post surgical intervention of the left  shoulder  Degenerative disc disease  cervical  spine  Sacroiliitis sacroiliac joint dysfunction  Greater trochanteric bursitis  Myofascial pain syndrome  Anxiety  Depression  Fibromyalgia     PLAN  Continue present medication Lyrica Norflex and Nucynta and attempt to taper Nucynta as discussed. Call to discuss any concerns you may have probably attempting to taper the Nucynta  F/U PCP Dr.N Humphrey Rolls for evaliation of blood pressure as discussed. Need to see primary care physician today for elevated blood pressure go to emergency room for evaluation  Continue TENS unit use as discussed  F/U Dr. Rosine Door as planned   F/U with ophthalmologist as discussed  May consider radiofrequency rhizolysis or intraspinal procedures pending response to present treatment and F/U evaluation   [facility] to [facility] should patient have concerns prior to scheduled return appointment

## 2015-08-07 NOTE — Progress Notes (Signed)
   Subjective:    Patient ID: Vickie Stevens, female    DOB: [birth year], 45 y.o.   MRN: [id number]  HPI    Review of Systems     Objective:   Physical Exam        Assessment & Plan:

## 2015-09-03 ENCOUNTER — Ambulatory Visit: Payer: Medicare Other | Attending: Pain Medicine | Admitting: Pain Medicine

## 2015-09-03 ENCOUNTER — Encounter: Payer: Self-pay | Admitting: Pain Medicine

## 2015-09-03 VITALS — BP 104/89 | HR 109 | Temp 97.7°F | Resp 18 | Ht 68.0 in | Wt 238.0 lb

## 2015-09-03 DIAGNOSIS — M791 Myalgia: Secondary | ICD-10-CM | POA: Diagnosis not present

## 2015-09-03 DIAGNOSIS — M19012 Primary osteoarthritis, left shoulder: Secondary | ICD-10-CM | POA: Diagnosis not present

## 2015-09-03 DIAGNOSIS — F4323 Adjustment disorder with mixed anxiety and depressed mood: Secondary | ICD-10-CM

## 2015-09-03 DIAGNOSIS — M533 Sacrococcygeal disorders, not elsewhere classified: Secondary | ICD-10-CM

## 2015-09-03 DIAGNOSIS — M549 Dorsalgia, unspecified: Secondary | ICD-10-CM | POA: Diagnosis present

## 2015-09-03 DIAGNOSIS — Z9889 Other specified postprocedural states: Secondary | ICD-10-CM | POA: Diagnosis not present

## 2015-09-03 DIAGNOSIS — M170 Bilateral primary osteoarthritis of knee: Secondary | ICD-10-CM

## 2015-09-03 DIAGNOSIS — M19011 Primary osteoarthritis, right shoulder: Secondary | ICD-10-CM | POA: Insufficient documentation

## 2015-09-03 DIAGNOSIS — F329 Major depressive disorder, single episode, unspecified: Secondary | ICD-10-CM

## 2015-09-03 DIAGNOSIS — M5416 Radiculopathy, lumbar region: Secondary | ICD-10-CM

## 2015-09-03 DIAGNOSIS — F32A Depression, unspecified: Secondary | ICD-10-CM

## 2015-09-03 DIAGNOSIS — M5030 Other cervical disc degeneration, unspecified cervical region: Secondary | ICD-10-CM

## 2015-09-03 DIAGNOSIS — M461 Sacroiliitis, not elsewhere classified: Secondary | ICD-10-CM | POA: Diagnosis not present

## 2015-09-03 DIAGNOSIS — M542 Cervicalgia: Secondary | ICD-10-CM | POA: Diagnosis present

## 2015-09-03 DIAGNOSIS — M19112 Post-traumatic osteoarthritis, left shoulder: Secondary | ICD-10-CM

## 2015-09-03 DIAGNOSIS — S43429D Sprain of unspecified rotator cuff capsule, subsequent encounter: Secondary | ICD-10-CM

## 2015-09-03 DIAGNOSIS — M47817 Spondylosis without myelopathy or radiculopathy, lumbosacral region: Secondary | ICD-10-CM

## 2015-09-03 DIAGNOSIS — M75101 Unspecified rotator cuff tear or rupture of right shoulder, not specified as traumatic: Secondary | ICD-10-CM

## 2015-09-03 DIAGNOSIS — S43421D Sprain of right rotator cuff capsule, subsequent encounter: Secondary | ICD-10-CM

## 2015-09-03 DIAGNOSIS — M75100 Unspecified rotator cuff tear or rupture of unspecified shoulder, not specified as traumatic: Secondary | ICD-10-CM

## 2015-09-03 DIAGNOSIS — M19119 Post-traumatic osteoarthritis, unspecified shoulder: Secondary | ICD-10-CM

## 2015-09-03 DIAGNOSIS — R51 Headache: Secondary | ICD-10-CM | POA: Diagnosis present

## 2015-09-03 DIAGNOSIS — M19111 Post-traumatic osteoarthritis, right shoulder: Secondary | ICD-10-CM

## 2015-09-03 DIAGNOSIS — M5412 Radiculopathy, cervical region: Secondary | ICD-10-CM

## 2015-09-03 DIAGNOSIS — M7060 Trochanteric bursitis, unspecified hip: Secondary | ICD-10-CM | POA: Insufficient documentation

## 2015-09-03 DIAGNOSIS — M5481 Occipital neuralgia: Secondary | ICD-10-CM | POA: Diagnosis not present

## 2015-09-03 MED ORDER — PREGABALIN 150 MG PO CAPS: ORAL_CAPSULE | ORAL | Status: DC

## 2015-09-03 MED ORDER — TAPENTADOL HCL 50 MG PO TABS: ORAL_TABLET | ORAL | Status: DC

## 2015-09-03 MED ORDER — ORPHENADRINE CITRATE ER 100 MG PO TB12: ORAL_TABLET | ORAL | Status: DC

## 2015-09-03 NOTE — Patient Instructions (Addendum)
PLAN  Continue present medication Lyrica Norflex and Nucynta and attempt to taper Nucynta   Greater occipital nerve block to be performed at time return appointment  F/U PCP Dr.N Humphrey Rolls for evaliation of blood pressure as discussed. Need to see primary care physician today for elevated blood pressure go to emergency room for evaluation  Continue TENS unit use as discussed  F/U Dr. Rosine Door as planned   F/U with ophthalmologist as discussed  May consider radiofrequency rhizolysis or intraspinal procedures pending response to present treatment and F/U evaluation  Patient to call [facility] should patient have concerns prior to scheduled return appointmentTrigger Point Injection Trigger points are areas where you have muscle pain. A trigger point injection is a shot given in the trigger point to relieve that pain. A trigger point might feel like a knot in your muscle. It hurts to press on a trigger point. Sometimes the pain spreads out (radiates) to other parts of the body. For example, pressing on a trigger point in your shoulder might cause pain in your arm or neck. You might have one trigger point. Or, you might have more than one. People often have trigger points in their upper back and lower back. They also occur often in the neck and shoulders. Pain from a trigger point lasts for a long time. It can make it hard to keep moving. You might not be able to do the exercise or physical therapy that could help you deal with the pain. A trigger point injection may help. It does not work for everyone. But, it may relieve your pain for a few days or a few months. A trigger point injection does not cure long-lasting (chronic) pain. LET YOUR CAREGIVER KNOW ABOUT:  Any allergies (especially to latex, lidocaine, or steroids).  Blood-thinning medicines that you take. These drugs can lead to bleeding or bruising after an injection. They  include:  Aspirin.  Ibuprofen.  Clopidogrel.  Warfarin.  Other medicines you take. This includes all vitamins, herbs, eyedrops, over-the-counter medicines, and creams.  Use of steroids.  Recent infections.  Past problems with numbing medicines.  Bleeding problems.  Surgeries you have had.  Other health problems. RISKS AND COMPLICATIONS A trigger point injection is a safe treatment. However, problems may develop, such as:  Minor side effects usually go away in 1 to 2 days. These may include:  Soreness.  Bruising.  Stiffness.  More serious problems are rare. But, they may include:  Bleeding under the skin (hematoma).  Skin infection.  Breaking off of the needle under your skin.  Lung puncture.  The trigger point injection may not work for you. BEFORE THE PROCEDURE You may need to stop taking any medicine that thins your blood. This is to prevent bleeding and bruising. Usually these medicines are stopped several days before the injection. No other preparation is needed. PROCEDURE  A trigger point injection can be given in your caregiver's office or in a clinic. Each injection takes 2 minutes or less.  Your caregiver will feel for trigger points. The caregiver may use a marker to circle the area for the injection.  The skin over the trigger point will be washed with a germ-killing (antiseptic) solution.  The caregiver pinches the spot for the injection.  Then, a very thin needle is used for the shot. You may feel pain or a twitching feeling when the needle enters the trigger point.  A numbing solution may be injected into the trigger point. Sometimes a drug to  keep down swelling, redness, and warmth (inflammation) is also injected.  Your caregiver moves the needle around the trigger zone until the tightness and twitching goes away.  After the injection, your caregiver may put gentle pressure over the injection site.  Then it is covered with a  bandage. AFTER THE PROCEDURE  You can go right home after the injection.  The bandage can be taken off after a few hours.  You may feel sore and stiff for 1 to 2 days.  Go back to your regular activities slowly. Your caregiver may ask you to stretch your muscles. Do not do anything that takes extra energy for a few days.  Follow your caregiver's instructions to manage and treat other pain.   This information is not intended to replace advice given to you by your health care provider. Make sure you discuss any questions you have with your health care provider.   Document Released: 06/11/2011 Document Revised: 10/17/2012 Document Reviewed: 06/11/2011 Elsevier Interactive Patient Education 2016 [place]. Occipital Nerve Block Patient Information  Description: The occipital nerves originate in the cervical (neck) spinal cord and travel upward through muscle and tissue to supply sensation to the back of the head and top of the scalp.  In addition, the nerves control some of the muscles of the scalp.  Occipital neuralgia is an irritation of these nerves which can cause headaches, numbness of the scalp, and neck discomfort.     The occipital nerve block will interrupt nerve transmission through these nerves and can relieve pain and spasm.  The block consists of insertion of a small needle under the skin in the back of the head to deposit local anesthetic (numbing medicine) and/or steroids around the nerve.  The entire block usually lasts less than 5 minutes.  Conditions which may be treated by occipital blocks:   Muscular pain and spasm of the scalp  Nerve irritation, back of the head  Headaches  Upper neck pain  Preparation for the injection:  1. Do not eat any solid food or dairy products within 6 hours of your appointment. 2. You may drink clear liquids up to 2 hours before appointment.  Clear liquids include water, black coffee, juice or soda.  No milk or cream please. 3. You  may take your regular medication, including pain medications, with a sip of water before you appointment.  Diabetics should hold regular insulin (if taken separately) and take 1/2 normal NPH dose the morning of the procedure.  Carry some sugar containing items with you to your appointment. 4. A driver must accompany you and be prepared to drive you home after your procedure. 5. Bring all your current medications with you. 6. An IV may be inserted and sedation may be given at the discretion of the physician. 7. A blood pressure cuff, EKG, and other monitors will often be applied during the procedure.  Some patients may need to have extra oxygen administered for a short period. 8. You will be asked to provide medical information, including your allergies and medications, prior to the procedure.  We must know immediately if you are taking blood thinners (like Coumadin/Warfarin) or if you are allergic to IV iodine contrast (dye).  We must know if you could possible be pregnant.  9. Do not wear a high collared shirt or turtleneck.  Tie long hair up in the back if possible.  Possible side-effects:   Bleeding from needle site  Infection (rare, may require surgery)  Nerve injury (rare)  Hair on back of neck can be tinged with iodine scrub (this will wash out)  Light-headedness (temporary)  Pain at injection site (several days)  Decreased blood pressure (rare, temporary)  Seizure (very rare)  Call if you experience:   Hives or difficulty breathing ( go to the emergency room)  Inflammation or drainage at the injection site(s)  Please note:  Although the local anesthetic injected can often make your painful muscles or headache feel good for several hours after the injection, the pain may return.  It takes 3-7 days for steroids to work.  You may not notice any pain relief for at least one week.  If effective, we will often do a series of injections spaced 3-6 weeks apart to maximally  decrease your pain.  If you have any questions, please call [phone] [place]  What are the risk, side effects and possible complications? Generally speaking, most procedures are safe.  However, with any procedure there are risks, side effects, and the possibility of complications.  The risks and complications are dependent upon the sites that are lesioned, or the type of nerve block to be performed.  The closer the procedure is to the spine, the more serious the risks are.  Great care is taken when placing the radio frequency needles, block needles or lesioning probes, but sometimes complications can occur.  Infection: Any time there is an injection through the skin, there is a risk of infection.  This is why sterile conditions are used for these blocks.  There are four possible types of infection.  Localized skin infection.  Central Nervous System Infection-This can be in the form of Meningitis, which can be deadly.  Epidural Infections-This can be in the form of an epidural abscess, which can cause pressure inside of the spine, causing compression of the spinal cord with subsequent paralysis. This would require an emergency surgery to decompress, and there are no guarantees that the patient would recover from the paralysis.  Discitis-This is an infection of the intervertebral discs.  It occurs in about 1% of discography procedures.  It is difficult to treat and it may lead to surgery.        2. Pain: the needles have to go through skin and soft tissues, will cause soreness.       3. Damage to internal structures:  The nerves to be lesioned may be near blood vessels or    other nerves which can be potentially damaged.       4. Bleeding: Bleeding is more common if the patient is taking blood thinners such as  aspirin, Coumadin, Ticiid, Plavix, etc., or if he/she have some genetic predisposition  such as hemophilia.  Bleeding into the spinal canal can cause compression of the spinal  cord with subsequent paralysis.  This would require an emergency surgery to  decompress and there are no guarantees that the patient would recover from the  paralysis.       5. Pneumothorax:  Puncturing of a lung is a possibility, every time a needle is introduced in  the area of the chest or upper back.  Pneumothorax refers to free air around the  collapsed lung(s), inside of the thoracic cavity (chest cavity).  Another two possible  complications related to a similar event would include: Hemothorax and Chylothorax.   These are variations of the Pneumothorax, where instead of air around the collapsed  lung(s), you may have blood or chyle,  respectively.       6. Spinal headaches: They may occur with any procedures in the area of the spine.       7. Persistent CSF (Cerebro-Spinal Fluid) leakage: This is a rare problem, but may occur  with prolonged intrathecal or epidural catheters either due to the formation of a fistulous  track or a dural tear.       8. Nerve damage: By working so close to the spinal cord, there is always a possibility of  nerve damage, which could be as serious as a permanent spinal cord injury with  paralysis.       9. Death:  Although rare, severe deadly allergic reactions known as "Anaphylactic  reaction" can occur to any of the medications used.      10. Worsening of the symptoms:  We can always make thing worse.  What are the chances of something like this happening? Chances of any of this occuring are extremely low.  By statistics, you have more of a chance of getting killed in a motor vehicle accident: while driving to the hospital than any of the above occurring .  Nevertheless, you should be aware that they are possibilities.  In general, it is similar to taking a shower.  Everybody knows that you can slip, hit your head and get killed.  Does that mean that you should not shower again?  Nevertheless always keep  in mind that statistics do not mean anything if you happen to be on the wrong side of them.  Even if a procedure has a 1 (one) in a 1,000,000 (million) chance of going wrong, it you happen to be that one..Also, keep in mind that by statistics, you have more of a chance of having something go wrong when taking medications.  Who should not have this procedure? If you are on a blood thinning medication (e.g. Coumadin, Plavix, see list of "Blood Thinners"), or if you have an active infection going on, you should not have the procedure.  If you are taking any blood thinners, please inform your physician.  How should I prepare for this procedure?  Do not eat or drink anything at least six hours prior to the procedure.  Bring a driver with you .  It cannot be a taxi.  Come accompanied by an adult that can drive you back, and that is strong enough to help you if your legs get weak or numb from the local anesthetic.  Take all of your medicines the morning of the procedure with just enough water to swallow them.  If you have diabetes, make sure that you are scheduled to have your procedure done first thing in the morning, whenever possible.  If you have diabetes, take only half of your insulin dose and notify our nurse that you have done so as soon as you arrive at the clinic.  If you are diabetic, but only take blood sugar pills (oral hypoglycemic), then do not take them on the morning of your procedure.  You may take them after you have had the procedure.  Do not take aspirin or any aspirin-containing medications, at least eleven (11) days prior to the procedure.  They may prolong bleeding.  Wear loose fitting clothing that may be easy to take off and that you would not mind if it got stained with Betadine or blood.  Do not wear any jewelry or perfume  Remove any nail coloring.  It will interfere with some of our monitoring equipment.  NOTE: Remember that this is not meant to be interpreted as a  complete list of all possible complications.  Unforeseen problems may occur.  BLOOD THINNERS The following drugs contain aspirin or other products, which can cause increased bleeding during surgery and should not be taken for 2 weeks prior to and 1 week after surgery.  If you should need take something for relief of minor pain, you may take acetaminophen which is found in Tylenol,m Datril, Anacin-3 and Panadol. It is not blood thinner. The products listed below are.  Do not take any of the products listed below in addition to any listed on your instruction sheet.  A.P.C or A.P.C with Codeine Codeine Phosphate Capsules #3 Ibuprofen Ridaura  ABC compound Congesprin Imuran rimadil  Advil Cope Indocin Robaxisal  Alka-Seltzer Effervescent Pain Reliever and Antacid Coricidin or Coricidin-D  Indomethacin Rufen  Alka-Seltzer plus Cold Medicine Cosprin Ketoprofen S-A-C Tablets  Anacin Analgesic Tablets or Capsules Coumadin Korlgesic Salflex  Anacin Extra Strength Analgesic tablets or capsules CP-2 Tablets Lanoril Salicylate  Anaprox Cuprimine Capsules Levenox Salocol  Anexsia-D Dalteparin Magan Salsalate  Anodynos Darvon compound Magnesium Salicylate Sine-off  Ansaid Dasin Capsules Magsal Sodium Salicylate  Anturane Depen Capsules Marnal Soma  APF Arthritis pain formula Dewitt's Pills Measurin Stanback  Argesic Dia-Gesic Meclofenamic Sulfinpyrazone  Arthritis Bayer Timed Release Aspirin Diclofenac Meclomen Sulindac  Arthritis pain formula Anacin Dicumarol Medipren Supac  Analgesic (Safety coated) Arthralgen Diffunasal Mefanamic Suprofen  Arthritis Strength Bufferin Dihydrocodeine Mepro Compound Suprol  Arthropan liquid Dopirydamole Methcarbomol with Aspirin Synalgos  ASA tablets/Enseals Disalcid Micrainin Tagament  Ascriptin Doan's Midol Talwin  Ascriptin A/D Dolene Mobidin Tanderil  Ascriptin Extra Strength Dolobid Moblgesic Ticlid  Ascriptin with Codeine Doloprin or Doloprin with Codeine  Momentum Tolectin  Asperbuf Duoprin Mono-gesic Trendar  Aspergum Duradyne Motrin or Motrin IB Triminicin  Aspirin plain, buffered or enteric coated Durasal Myochrisine Trigesic  Aspirin Suppositories Easprin Nalfon Trillsate  Aspirin with Codeine Ecotrin Regular or Extra Strength Naprosyn Uracel  Atromid-S Efficin Naproxen Ursinus  Auranofin Capsules Elmiron Neocylate Vanquish  Axotal Emagrin Norgesic Verin  Azathioprine Empirin or Empirin with Codeine Normiflo Vitamin E  Azolid Emprazil Nuprin Voltaren  Bayer Aspirin plain, buffered or children's or timed BC Tablets or powders Encaprin Orgaran Warfarin Sodium  Buff-a-Comp Enoxaparin Orudis Zorpin  Buff-a-Comp with Codeine Equegesic Os-Cal-Gesic   Buffaprin Excedrin plain, buffered or Extra Strength Oxalid   Bufferin Arthritis Strength Feldene Oxphenbutazone   Bufferin plain or Extra Strength Feldene Capsules Oxycodone with Aspirin   Bufferin with Codeine Fenoprofen Fenoprofen Pabalate or Pabalate-SF   Buffets II Flogesic Panagesic   Buffinol plain or Extra Strength Florinal or Florinal with Codeine Panwarfarin   Buf-Tabs Flurbiprofen Penicillamine   Butalbital Compound Four-way cold tablets Penicillin   Butazolidin Fragmin Pepto-Bismol   Carbenicillin Geminisyn Percodan   Carna Arthritis Reliever Geopen Persantine   Carprofen Gold's salt Persistin   Chloramphenicol Goody's Phenylbutazone   Chloromycetin Haltrain Piroxlcam   Clmetidine heparin Plaquenil   Cllnoril Hyco-pap Ponstel   Clofibrate Hydroxy chloroquine Propoxyphen         Before stopping any of these medications, be sure to consult the physician who ordered them.  Some, such as Coumadin (Warfarin) are ordered to prevent or treat serious conditions such as "deep thrombosis", "pumonary embolisms", and other heart problems.  The amount of time that you may need off of the medication may also vary with the medication and the reason for which you were taking it.  If you are  taking any  of these medications, please make sure you notify your pain physician before you undergo any procedures.

## 2015-09-03 NOTE — Progress Notes (Signed)
Subjective:    Patient ID: Vickie Stevens, female    DOB: 04-27-1971, 45 y.o.   MRN: [id number]  HPI   The patient is a 45 year old female who returns to pain management for further evaluation and treatment of pain involving headaches as well as pain occurring in the neck shoulders entire back and lower extremity regions. The patient is with severe pain occurring in the region of the shoulders neck with pain of the neck radiating to the back of the hip precipitating headaches. The patient also complains of pain involving the left knee. We discussed patient's medications and will continue Lyrica Norflex and Nucynta. The patient denies any undesirable side effects from the medications. We will proceed with interventional treatment at time return appointment consisting of greater occipital nerve block with myoneural block injections of the cervical thoracic region and region of shoulder. We've also discussed considering injections in the region of the lumbar area at the time of patient's procedure. The patient continues to be with severely limited range of motion of the left shoulder and is with decreased grip strength as well. The patient is undergone surgical and neurological evaluations and we have recommended patient undergo reevaluation in this regard as well. We will proceed with greater occipital nerve block at time of return appointment with myoneural block injections as discussed. The patient was in agreement with suggested treatment plan.      Review of Systems     Objective:   Physical Exam  There was tenderness to palpation of the splenius capitis and occipitalis musculature region of severe degree with severe tenderness of the cervical facet cervical paraspinal musculature region. There was tenderness of the trapezius levator scapula and rhomboid musculature region with severe degree on the left compared to the right with palpation over the thoracic paraspinal muscular treat thoracic  facet region reproducing moderate discomfort as well. No crepitus of the thoracic region was noted. Patient was with severe tenderness of the acromioclavicular and glenohumeral joint region on the left as well as on the right with severely decreased range of motion of the left shoulder compared to the right shoulder. Tinel and Phalen's maneuver were without increase of pain of significant degree and patient was with evidence of decreased grip strength on the left compared to the right. Palpation over the thoracic region thoracic facet region was attends to palpation of moderate degree with lateral bending rotation extension and palpation of the lumbar facets reproducing moderate discomfort. Straight leg raise was tolerates approximately 20 without increased pain with dorsiflexion noted. EHL strength appeared to be slightly decreased. There was no sensory deficit of dermatomal distribution detected. There was negative clonus negative Homans. The left knee was attends to palpation in crepitus of the knee with negative anterior and posterior drawer signs without ballottement of the patella. EHL strength appeared to be decreased. Sensory deficit of dermatomal distribution of the lower extremities noted. Abdomen was without excessive tends to palpation and no costovertebral tenderness was noted.    Assessment & Plan:     Bilateral occipital neuralgia  Degenerative joint disease of shoulders  Rotator cuff tear of shoulders Status post surgical intervention of the left  shoulder  Degenerative disc disease cervical spine  Sacroiliitis sacroiliac joint dysfunction  Greater trochanteric bursitis  Myofascial pain syndrome     PLAN  Continue present medication Lyrica Norflex and Nucynta and attempt to taper Nucynta   Greater occipital nerve block to be performed at time return appointment  F/U PCP Dr.N Humphrey Rolls for  evaliation of blood pressure as discussed. Need to see primary care physician today for  elevated blood pressure go to emergency room for evaluation  Continue TENS unit use as discussed  F/U Dr. Rosine Door as planned   F/U with ophthalmologist as discussed  May consider radiofrequency rhizolysis or intraspinal procedures pending response to present treatment and F/U evaluation  Patient to call [facility] should patient have concerns prior to scheduled return appointment

## 2015-09-03 NOTE — Progress Notes (Signed)
Safety precautions to be maintained throughout the outpatient stay will include: orient to surroundings, keep bed in low position, maintain call bell within reach at all times, provide assistance with transfer out of bed and ambulation.  Prescription faxed to [facility] for Nucynta 1m (540 oills), Lyrica [id number] pills (270pills)- 3 month supply  Gave pt rx in handfor Norflex 18054m

## 2015-09-09 ENCOUNTER — Ambulatory Visit: Payer: Medicare Other | Attending: Pain Medicine | Admitting: Pain Medicine

## 2015-09-09 ENCOUNTER — Encounter: Payer: Self-pay | Admitting: Pain Medicine

## 2015-09-09 VITALS — BP 120/72 | HR 90 | Temp 98.3°F | Resp 18 | Ht 69.0 in | Wt 235.0 lb

## 2015-09-09 DIAGNOSIS — M62830 Muscle spasm of back: Secondary | ICD-10-CM | POA: Insufficient documentation

## 2015-09-09 DIAGNOSIS — M5030 Other cervical disc degeneration, unspecified cervical region: Secondary | ICD-10-CM

## 2015-09-09 DIAGNOSIS — M5412 Radiculopathy, cervical region: Secondary | ICD-10-CM

## 2015-09-09 DIAGNOSIS — R51 Headache: Secondary | ICD-10-CM | POA: Diagnosis present

## 2015-09-09 DIAGNOSIS — M542 Cervicalgia: Secondary | ICD-10-CM | POA: Diagnosis not present

## 2015-09-09 DIAGNOSIS — M75101 Unspecified rotator cuff tear or rupture of right shoulder, not specified as traumatic: Secondary | ICD-10-CM

## 2015-09-09 DIAGNOSIS — Z9889 Other specified postprocedural states: Secondary | ICD-10-CM | POA: Insufficient documentation

## 2015-09-09 DIAGNOSIS — M19119 Post-traumatic osteoarthritis, unspecified shoulder: Secondary | ICD-10-CM

## 2015-09-09 DIAGNOSIS — M19112 Post-traumatic osteoarthritis, left shoulder: Secondary | ICD-10-CM

## 2015-09-09 DIAGNOSIS — M75100 Unspecified rotator cuff tear or rupture of unspecified shoulder, not specified as traumatic: Secondary | ICD-10-CM

## 2015-09-09 DIAGNOSIS — S43421D Sprain of right rotator cuff capsule, subsequent encounter: Secondary | ICD-10-CM

## 2015-09-09 DIAGNOSIS — S43429D Sprain of unspecified rotator cuff capsule, subsequent encounter: Secondary | ICD-10-CM

## 2015-09-09 DIAGNOSIS — F329 Major depressive disorder, single episode, unspecified: Secondary | ICD-10-CM

## 2015-09-09 DIAGNOSIS — M170 Bilateral primary osteoarthritis of knee: Secondary | ICD-10-CM

## 2015-09-09 DIAGNOSIS — M47817 Spondylosis without myelopathy or radiculopathy, lumbosacral region: Secondary | ICD-10-CM

## 2015-09-09 DIAGNOSIS — M19012 Primary osteoarthritis, left shoulder: Secondary | ICD-10-CM

## 2015-09-09 DIAGNOSIS — M19011 Primary osteoarthritis, right shoulder: Secondary | ICD-10-CM

## 2015-09-09 DIAGNOSIS — F32A Depression, unspecified: Secondary | ICD-10-CM

## 2015-09-09 DIAGNOSIS — F4323 Adjustment disorder with mixed anxiety and depressed mood: Secondary | ICD-10-CM

## 2015-09-09 DIAGNOSIS — M5416 Radiculopathy, lumbar region: Secondary | ICD-10-CM

## 2015-09-09 DIAGNOSIS — M5481 Occipital neuralgia: Secondary | ICD-10-CM

## 2015-09-09 DIAGNOSIS — M533 Sacrococcygeal disorders, not elsewhere classified: Secondary | ICD-10-CM

## 2015-09-09 DIAGNOSIS — M19111 Post-traumatic osteoarthritis, right shoulder: Secondary | ICD-10-CM

## 2015-09-09 MED ORDER — FENTANYL CITRATE (PF) 100 MCG/2ML IJ SOLN
INTRAMUSCULAR | Status: AC
  Administered 2015-09-09: 100 ug via INTRAVENOUS
  Filled 2015-09-09: qty 2

## 2015-09-09 MED ORDER — TAPENTADOL HCL 50 MG PO TABS: ORAL_TABLET | ORAL | Status: DC

## 2015-09-09 MED ORDER — MIDAZOLAM HCL 5 MG/5ML IJ SOLN
INTRAMUSCULAR | Status: AC
  Administered 2015-09-09: 5 mg via INTRAVENOUS
  Filled 2015-09-09: qty 5

## 2015-09-09 MED ORDER — BUPIVACAINE HCL (PF) 0.25 % IJ SOLN
INTRAMUSCULAR | Status: AC
  Administered 2015-09-09: 14:00:00
  Filled 2015-09-09: qty 30

## 2015-09-09 MED ORDER — BUPIVACAINE HCL (PF) 0.25 % IJ SOLN: 30.0000 mL | Freq: Once | INTRAMUSCULAR | Status: DC

## 2015-09-09 MED ORDER — TRIAMCINOLONE ACETONIDE 40 MG/ML IJ SUSP: 40.0000 mg | Freq: Once | INTRAMUSCULAR | Status: DC

## 2015-09-09 MED ORDER — ORPHENADRINE CITRATE 30 MG/ML IJ SOLN: 60.0000 mg | Freq: Once | INTRAMUSCULAR | Status: DC

## 2015-09-09 MED ORDER — ORPHENADRINE CITRATE 30 MG/ML IJ SOLN
INTRAMUSCULAR | Status: AC
  Administered 2015-09-09: 14:00:00
  Filled 2015-09-09: qty 2

## 2015-09-09 MED ORDER — TRIAMCINOLONE ACETONIDE 40 MG/ML IJ SUSP
INTRAMUSCULAR | Status: AC
  Administered 2015-09-09: 14:00:00
  Filled 2015-09-09: qty 1

## 2015-09-09 NOTE — Progress Notes (Signed)
09/09/2015: Was not able to get script for Nucynta filled due to needing prior authorization. Has only been taking 2 Nucynta tablets per day for the last six days instead of the 8 tablets/day that she normally takes. Call to [facility] to get prior authorization for Nucynta. Authorization was denied due to patient taking Klonopin concurrently with Nucynta. A letter from this insurance company will be faxed to our office and mailed to the patient. .  After discussion with the patient and Dr. Primus Bravo, the patient has been tapering Klonopin and is able to stop taking the Klonopin at this time. Call back to [facility] to inform them of this and get authorization; APPROVAL granted for the Nucynta; patient and Dr. Primus Bravo aware; Dr. Primus Bravo gave patient a new hard script for the [place].

## 2015-09-09 NOTE — Patient Instructions (Addendum)
PLAN  Continue present medication Lyrica Norflex and Nucynta and attempt to taper Nucynta as discussed  F/U PCP Dr.N Humphrey Rolls for evaliation of blood pressure as discussed.  Continue TENS unit use as discussed  F/U Dr. Rosine Door this week as planned for further evaluation as discussed   F/U with ophthalmologist as discussed  May consider radiofrequency rhizolysis or intraspinal procedures pending response to present treatment and F/U evaluation  Patient to call [facility] Management Center should patient have concerns prior to scheduled return appointment

## 2015-09-09 NOTE — Progress Notes (Signed)
Subjective:    Patient ID: Vickie Stevens, female    DOB: 10/31/1970, 45 y.o.   MRN: [id number]  HPI  NOTE: The patient is a 45 y.o.-year-old female who returns to the [facility] for further evaluation and treatment of pain consisting of pain involving the region of the neck and headache.  Patient is with history of trauma resulting in damage to the shoulders on the left as well as on the right with prior surgery of the left shoulder. The patient is a severe pain and spasm occurring in the cervicothoracic region with pain radiating from the cervical region for the occipitalis region reproducing severe headaches. Has been concern regarding significant component of patient's headaches been due to greater occipital neuralgia. .  The risks, benefits, and expectations of the procedure have been discussed and explained to patient, who is understanding and wishes to proceed with interventional treatment as discussed and as explained to patient.  Will proceed with greater occipital nerve blocks with myoneural block injections at this time as discussed and as explained to patient.  All are understanding and in agreement with suggested treatment plan.    PROCEDURE:  Greater occipital nerve block on the left side with IV Versed, IV Fentanyl, conscious sedation, EKG, blood pressure, pulse, pulse oximetry monitoring.  Procedure performed with patient in prone position.  Greater occipital nerve block on the left side.   With patient in prone position, Betadine prep of proposed entry site accomplished.  Following identification of the nuchal ridge, 22 -gauge needle was inserted at the level of the nuchal ridge medial to the occipital artery.  Following negative aspiration, 4cc 0.25% bupivacaine with Kenalog injected for left greater occipital nerve block.  Needle was removed.  Patient tolerated injection well.   Greater occipital nerve block on the rightt side. The greater occipital nerve block on the  right side was performed exactly as the left greater occipital nerve block was performed and utilizing the same technique.  Myoneural block injections of the cervical paraspinal musculature region and thoracic region. Following Betadine prep of proposed entry site a 22-gauge needle was inserted in the cervical paraspinal musculature region and following negative aspiration 2 cc of 0.25% bupivacaine with Norflex was injected for myoneural block injection of the cervical region 2. Following Betadine prep of proposed entry site of the thoracic region a 22-gauge needle was inserted in the thoracic musculature region and following negative aspiration 2 cc of 0.25% bupivacaine with Norflex was injected for myoneural block injection of the thoracic region 4.  The patient tolerated the procedure well   A total of 10 mg Kenalog was utilized for the entire procedure.  PLAN:    1. Medications: Will continue presently prescribed medications Nucynta and Lyrica and Norflex at this time. 2. Patient to follow up with primary care physician  Wolfgang Phoenix for evaluation of blood pressure and general medical condition status post procedure performed on today's visit. 3. Neurological evaluation for further assessment of headaches for further studies as discussed.. Patient is to follow-up with Dr. Rosine Door this week for psych follow-up evaluation as discussed 4. Surgical evaluation as discussed.  5. Patient may be candidate for Botox injections, radiofrequency procedures, as well as implantation type procedures pending response to treatment rendered on today's visit and pending follow-up evaluation. 6. Patient has been advised to adhere to proper body mechanics and to avoid activities which appear to aggravate condition.cations:  Will continue presently prescribed medications at this time. 7. The patient is understanding  and in agreement with the suggested treatment plan.   Review of Systems     Objective:   Physical  Exam        Assessment & Plan:

## 2015-09-10 ENCOUNTER — Telehealth: Payer: Self-pay | Admitting: *Deleted

## 2015-09-10 NOTE — Telephone Encounter (Signed)
No problems post procedure. 

## 2015-09-17 ENCOUNTER — Other Ambulatory Visit: Payer: Self-pay | Admitting: Pain Medicine

## 2015-09-25 ENCOUNTER — Other Ambulatory Visit: Payer: Self-pay | Admitting: Pain Medicine

## 2015-10-01 ENCOUNTER — Encounter: Payer: Self-pay | Admitting: Pain Medicine

## 2017-07-13 ENCOUNTER — Encounter: Payer: Medicare PPO | Attending: Internal Medicine | Admitting: *Deleted

## 2017-07-13 ENCOUNTER — Encounter: Payer: Self-pay | Admitting: *Deleted

## 2017-07-13 VITALS — BP 110/88 | Ht 69.0 in | Wt 232.2 lb

## 2017-07-13 DIAGNOSIS — E119 Type 2 diabetes mellitus without complications: Secondary | ICD-10-CM | POA: Insufficient documentation

## 2017-07-13 NOTE — Patient Instructions (Signed)
Check blood sugars 1 x day before breakfast or 2 hrs after supper every day Bring blood sugar records to the next class  Check expiration date on strips Call insurance to see which meter is preferred  Eat 3 meals day, 1 snack a day Space meals 4-6 hours apart Don't skip meals Avoid sugar sweetened drinks (juices)  Return for glasses on:

## 2017-07-14 ENCOUNTER — Telehealth: Payer: Self-pay | Admitting: *Deleted

## 2017-07-14 NOTE — Progress Notes (Signed)
Diabetes Self-Management Education  Visit Type: First/Initial  Appt. Start Time: 1410 Appt. End Time: 1520  07/14/2017  [name], identified by name and date of birth, is a 47 y.o. female with a diagnosis of Diabetes: Type 2.   ASSESSMENT  Blood pressure 110/88, height 5' 9"  (1.753 m), weight 232 lb 3.2 oz (105.3 kg). Body mass index is 34.29 kg/m.  Diabetes Self-Management Education - 07/13/17 1731      Visit Information   Visit Type  First/Initial      Initial Visit   Diabetes Type  Type 2    Are you currently following a meal plan?  Yes    What type of meal plan do you follow?  Low carb    Are you taking your medications as prescribed?  Yes    Date Diagnosed  "the doctor never told me I had diabetes - didn't know I had it until you called to schedule an appointment" - pt was seen in Nov at [facility]      Health Coping   How would you rate your overall health?  Good      Psychosocial Assessment   Patient Belief/Attitude about Diabetes  Denial    Self-care barriers  Other (comment) Chronic pain     Self-management support  Family;Doctor's office    Patient Concerns  Nutrition/Meal planning;Glycemic Control;Medication;Monitoring;Healthy Lifestyle    Special Needs  None    Preferred Learning Style  Auditory;Visual;Hands on    [place]    How often do you need to have someone help you when you read instructions, pamphlets, or other written materials from your doctor or pharmacy?  1 - Never    What is the last grade level you completed in school?  [employer]      Pre-Education Assessment   Patient understands the diabetes disease and treatment process.  Needs Instruction    Patient understands incorporating nutritional management into lifestyle.  Needs Instruction    Patient undertands incorporating physical activity into lifestyle.  Needs Instruction    Patient understands using medications safely.  Needs Instruction    Patient  understands monitoring blood glucose, interpreting and using results  Needs Review    Patient understands prevention, detection, and treatment of acute complications.  Needs Instruction    Patient understands prevention, detection, and treatment of chronic complications.  Needs Instruction    Patient understands how to develop strategies to address psychosocial issues.  Needs Instruction    Patient understands how to develop strategies to promote health/change behavior.  Needs Instruction      Complications   How often do you check your blood sugar?  0 times/day (not testing) Pt brought a meter (FreeStyle) to this appointment from home. It was a family member's but the strips have expired. She will call her insurance plan to see if this meter is a preferred meter and call back if she needs another meter.     Have you had a dilated eye exam in the past 12 months?  Yes    Have you had a dental exam in the past 12 months?  No    Are you checking your feet?  Yes    How many days per week are you checking your feet?  7      Dietary Intake   Breakfast  sleeps until 1-2 pm and doesn't eat breakfast    Lunch  chicken, 1/2 salad, soup, navy beans, chicken and rice  Dinner  beef, chicken, tofu, occasional potatoes, rice, peas, corn, beans    Beverage(s)  water, fruit juice      Exercise   Exercise Type  ADL's      Patient Education   Previous Diabetes Education  No    Disease state   Definition of diabetes, type 1 and 2, and the diagnosis of diabetes    Nutrition management   Role of diet in the treatment of diabetes and the relationship between the three main macronutrients and blood glucose level;Reviewed blood glucose goals for pre and post meals and how to evaluate the patients' food intake on their blood glucose level.    Physical activity and exercise   Role of exercise on diabetes management, blood pressure control and cardiac health.    Medications  Reviewed patients medication for  diabetes, action, purpose, timing of dose and side effects.    Monitoring  Purpose and frequency of SMBG.;Taught/discussed recording of test results and interpretation of SMBG.;Identified appropriate SMBG and/or A1C goals.    Chronic complications  Relationship between chronic complications and blood glucose control    Psychosocial adjustment  Identified and addressed patients feelings and concerns about diabetes      Individualized Goals (developed by patient)   Reducing Risk  Improve blood sugars Decrease medications Prevent diabetes complications Lead a healthier lifestyle     Outcomes   Expected Outcomes  Demonstrated interest in learning. Expect positive outcomes    Future DMSE  4-6 wks       Individualized Plan for Diabetes Self-Management Training:   Learning Objective:  Patient will have a greater understanding of diabetes self-management. Patient education plan is to attend individual and/or group sessions per assessed needs and concerns.   Plan:   Patient Instructions  Check blood sugars 1 x day before breakfast or 2 hrs after supper every day Bring blood sugar records to the next class Check expiration date on strips Call insurance to see which meter is preferred Eat 3 meals day, 1 snack a day Space meals 4-6 hours apart Don't skip meals Avoid sugar sweetened drinks (juices)  Expected Outcomes:  Demonstrated interest in learning. Expect positive outcomes  Education material provided:  General Meal Planning Guidelines Simple Meal Plan Lancet device  If problems or questions, patient to contact team via:  Johny Drilling, [place], [place], CDE [phone]  Future DSME appointment: 4-6 wks  August 09, 2017 for Diabetes Class 1

## 2017-07-14 NOTE — Telephone Encounter (Signed)
Pt left a voice mail that she would need a new meter. Called her and she reports that her insurance doesn't cover the meter that her family member gave her. She will come in tomorrow and be instructed on the Accu-Chek Guide.

## 2017-07-15 ENCOUNTER — Encounter: Payer: Self-pay | Admitting: *Deleted

## 2017-07-15 NOTE — Progress Notes (Signed)
Pt came to office to obtain new glucometer that is covered with her insurance. Provided Accu-Chek Guide meter and instructed on use. BG upon return demonstration was 153 mg/dL at 2:05 pm - fasting. She will return next month for classes. Instructed her to call for any questions.

## 2017-08-09 ENCOUNTER — Encounter: Payer: Medicare PPO | Attending: Internal Medicine | Admitting: Dietician

## 2017-08-09 ENCOUNTER — Encounter: Payer: Self-pay | Admitting: Dietician

## 2017-08-09 VITALS — Ht 69.0 in | Wt 234.6 lb

## 2017-08-09 DIAGNOSIS — E119 Type 2 diabetes mellitus without complications: Secondary | ICD-10-CM | POA: Insufficient documentation

## 2017-08-09 NOTE — Progress Notes (Signed)

## 2017-08-12 ENCOUNTER — Encounter: Payer: Self-pay | Admitting: *Deleted

## 2017-08-12 ENCOUNTER — Encounter: Payer: Medicare PPO | Admitting: *Deleted

## 2017-08-12 VITALS — Wt 230.7 lb

## 2017-08-12 DIAGNOSIS — E119 Type 2 diabetes mellitus without complications: Secondary | ICD-10-CM | POA: Diagnosis not present

## 2017-08-12 NOTE — Progress Notes (Signed)

## 2017-08-16 ENCOUNTER — Ambulatory Visit: Payer: Self-pay

## 2017-08-19 ENCOUNTER — Encounter: Payer: Medicare PPO | Admitting: Dietician

## 2017-08-19 ENCOUNTER — Encounter: Payer: Self-pay | Admitting: Dietician

## 2017-08-19 VITALS — BP 104/80 | Ht 69.0 in | Wt 234.1 lb

## 2017-08-19 DIAGNOSIS — E119 Type 2 diabetes mellitus without complications: Secondary | ICD-10-CM

## 2017-08-19 NOTE — Progress Notes (Signed)

## 2017-08-20 ENCOUNTER — Encounter: Payer: Self-pay | Admitting: *Deleted

## 2017-08-23 ENCOUNTER — Ambulatory Visit: Payer: Self-pay

## 2017-09-23 ENCOUNTER — Other Ambulatory Visit: Payer: Self-pay | Admitting: Internal Medicine

## 2017-09-23 DIAGNOSIS — R921 Mammographic calcification found on diagnostic imaging of breast: Secondary | ICD-10-CM

## 2017-10-08 ENCOUNTER — Other Ambulatory Visit: Payer: Self-pay

## 2017-10-19 ENCOUNTER — Ambulatory Visit: Payer: Self-pay | Admitting: Obstetrics and Gynecology

## 2017-10-19 ENCOUNTER — Ambulatory Visit
Admission: RE | Admit: 2017-10-19 | Discharge: 2017-10-19 | Disposition: A | Discharge status: Home / Self Care | Payer: Medicare PPO | Source: Ambulatory Visit | Attending: Internal Medicine | Admitting: Internal Medicine

## 2017-10-19 DIAGNOSIS — R921 Mammographic calcification found on diagnostic imaging of breast: Secondary | ICD-10-CM

## 2017-11-23 DIAGNOSIS — B009 Herpesviral infection, unspecified: Secondary | ICD-10-CM | POA: Insufficient documentation

## 2017-12-08 ENCOUNTER — Emergency Department (HOSPITAL_COMMUNITY): Payer: Medicare PPO

## 2017-12-08 ENCOUNTER — Other Ambulatory Visit: Payer: Self-pay

## 2017-12-08 ENCOUNTER — Emergency Department (HOSPITAL_COMMUNITY)
Admission: EM | Admit: 2017-12-08 | Discharge: 2017-12-09 | Disposition: A | Discharge status: Home / Self Care | Payer: Medicare PPO | Attending: Emergency Medicine | Admitting: Emergency Medicine

## 2017-12-08 ENCOUNTER — Encounter (HOSPITAL_COMMUNITY): Payer: Self-pay | Admitting: Emergency Medicine

## 2017-12-08 DIAGNOSIS — R202 Paresthesia of skin: Secondary | ICD-10-CM

## 2017-12-08 DIAGNOSIS — R51 Headache: Secondary | ICD-10-CM

## 2017-12-08 DIAGNOSIS — R55 Syncope and collapse: Secondary | ICD-10-CM | POA: Insufficient documentation

## 2017-12-08 DIAGNOSIS — I10 Essential (primary) hypertension: Secondary | ICD-10-CM | POA: Insufficient documentation

## 2017-12-08 DIAGNOSIS — E119 Type 2 diabetes mellitus without complications: Secondary | ICD-10-CM | POA: Diagnosis not present

## 2017-12-08 DIAGNOSIS — R42 Dizziness and giddiness: Secondary | ICD-10-CM | POA: Insufficient documentation

## 2017-12-08 DIAGNOSIS — Z7984 Long term (current) use of oral hypoglycemic drugs: Secondary | ICD-10-CM | POA: Insufficient documentation

## 2017-12-08 DIAGNOSIS — R200 Anesthesia of skin: Secondary | ICD-10-CM | POA: Insufficient documentation

## 2017-12-08 DIAGNOSIS — Z79899 Other long term (current) drug therapy: Secondary | ICD-10-CM | POA: Insufficient documentation

## 2017-12-08 DIAGNOSIS — R519 Headache, unspecified: Secondary | ICD-10-CM

## 2017-12-08 LAB — COMPREHENSIVE METABOLIC PANEL
ALT: 14 U/L (ref 14–54)
AST: 18 U/L (ref 15–41)
Albumin: 3.3 g/dL — ABNORMAL LOW (ref 3.5–5.0)
Alkaline Phosphatase: 75 U/L (ref 38–126)
Anion gap: 9 (ref 5–15)
BILIRUBIN TOTAL: 0.3 mg/dL (ref 0.3–1.2)
BUN: 21 mg/dL — ABNORMAL HIGH (ref 6–20)
CHLORIDE: 107 mmol/L (ref 101–111)
CO2: 23 mmol/L (ref 22–32)
CREATININE: 0.67 mg/dL (ref 0.44–1.00)
Calcium: 8.9 mg/dL (ref 8.9–10.3)
Glucose, Bld: 117 mg/dL — ABNORMAL HIGH (ref 65–99)
POTASSIUM: 3.2 mmol/L — AB (ref 3.5–5.1)
Sodium: 139 mmol/L (ref 135–145)
TOTAL PROTEIN: 6.3 g/dL — AB (ref 6.5–8.1)

## 2017-12-08 LAB — CBC
HCT: 41.7 % (ref 36.0–46.0)
Hemoglobin: 13.6 g/dL (ref 12.0–15.0)
MCH: 28.2 pg (ref 26.0–34.0)
MCHC: 32.6 g/dL (ref 30.0–36.0)
MCV: 86.3 fL (ref 78.0–100.0)
PLATELETS: 325 10*3/uL (ref 150–400)
RBC: 4.83 MIL/uL (ref 3.87–5.11)
RDW: 13.7 % (ref 11.5–15.5)
WBC: 8 10*3/uL (ref 4.0–10.5)

## 2017-12-08 LAB — DIFFERENTIAL
Abs Immature Granulocytes: 0 10*3/uL (ref 0.0–0.1)
BASOS PCT: 1 %
Basophils Absolute: 0 10*3/uL (ref 0.0–0.1)
EOS ABS: 0.2 10*3/uL (ref 0.0–0.7)
EOS PCT: 2 %
Immature Granulocytes: 0 %
LYMPHS ABS: 2.9 10*3/uL (ref 0.7–4.0)
Lymphocytes Relative: 36 %
MONO ABS: 0.7 10*3/uL (ref 0.1–1.0)
Monocytes Relative: 9 %
NEUTROS PCT: 52 %
Neutro Abs: 4.2 10*3/uL (ref 1.7–7.7)

## 2017-12-08 LAB — I-STAT CHEM 8, ED
BUN: 22 mg/dL — ABNORMAL HIGH (ref 6–20)
CALCIUM ION: 1.15 mmol/L (ref 1.15–1.40)
CREATININE: 0.6 mg/dL (ref 0.44–1.00)
Chloride: 103 mmol/L (ref 101–111)
Glucose, Bld: 115 mg/dL — ABNORMAL HIGH (ref 65–99)
HCT: 40 % (ref 36.0–46.0)
HEMOGLOBIN: 13.6 g/dL (ref 12.0–15.0)
Potassium: 3.1 mmol/L — ABNORMAL LOW (ref 3.5–5.1)
SODIUM: 139 mmol/L (ref 135–145)
TCO2: 24 mmol/L (ref 22–32)

## 2017-12-08 LAB — I-STAT TROPONIN, ED: TROPONIN I, POC: 0 ng/mL (ref 0.00–0.08)

## 2017-12-08 LAB — PROTIME-INR
INR: 0.97
PROTHROMBIN TIME: 12.8 s (ref 11.4–15.2)

## 2017-12-08 LAB — I-STAT BETA HCG BLOOD, ED (MC, WL, AP ONLY): I-stat hCG, quantitative: <5 m[IU]/mL (ref <5)

## 2017-12-08 LAB — CBG MONITORING, ED
Glucose-Capillary: 109 mg/dL — ABNORMAL HIGH (ref 65–99)
Glucose-Capillary: 98 mg/dL (ref 65–99)

## 2017-12-08 LAB — APTT: aPTT: 26 seconds (ref 24–36)

## 2017-12-08 MED ORDER — GADOBENATE DIMEGLUMINE 529 MG/ML IV SOLN
20.0000 mL | Freq: Once | INTRAVENOUS | Status: AC | PRN
  Administered 2017-12-08: 20 mL via INTRAVENOUS

## 2017-12-08 MED ORDER — POTASSIUM CHLORIDE CRYS ER 20 MEQ PO TBCR
40.0000 meq | EXTENDED_RELEASE_TABLET | Freq: Once | ORAL | Status: AC
  Administered 2017-12-09: 40 meq via ORAL
  Filled 2017-12-08: qty 2

## 2017-12-08 MED ORDER — LORAZEPAM 2 MG/ML IJ SOLN
1.0000 mg | Freq: Once | INTRAMUSCULAR | Status: AC
  Administered 2017-12-08: 1 mg via INTRAVENOUS
  Filled 2017-12-08: qty 1

## 2017-12-08 NOTE — ED Provider Notes (Signed)
[place] EMERGENCY DEPARTMENT Provider Note   CSN: [id number] Arrival date & time: 12/08/17  2021   An emergency department physician performed an initial assessment on this suspected stroke patient at 2020.  History   Chief Complaint Chief Complaint  Patient presents with  . Code Stroke    HPI Vickie Stevens is a 47 y.o. female.  HPI  47 year old female presents as a code stroke.  She has a history of diabetes as well as bipolar, depression.  She states that all of a sudden around 51 she developed a 6 out of 10 occipital headache.  She also felt dizzy.  She went to urgent care because her glucose was 300, 400, and high on multiple checks.  At urgent care she passed out twice.  There was concern about her pupils being unequal.  There was no apparent seizure-like activity.  Since then they were concerned about strokelike symptoms and so a code stroke was activated.  The patient has a chronic lower extremity and left arm weakness from neuropathy.  She just saw a neurologist yesterday where she had an EMG.  There is no new weakness.  She states the headache has improved and is 3/10.  No chest pain.  Past Medical History:  Diagnosis Date  . ADHD (attention deficit hyperactivity disorder)   . Biceps tendonitis   . Bipolar disorder ([place])   . Chronic back pain   . Cluster headache   . Crohn's disease ([place])   . DDD (degenerative disc disease), lumbar   . Depression   . Diabetes mellitus without complication ([place])   . Hypercholesteremia   . Hypertension   . PCOS (polycystic ovarian syndrome)   . Rotator cuff tendinitis   . Sleep apnea   . 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 Adjustment disorder with mixed anxiety and depressed mood 08/07/2015  . Depression 08/07/2015  . 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 Tear of rotator cuff 12/05/2014  . Bilateral occipital neuralgia 12/03/2014  . DDD (degenerative disc disease),  cervical 11/06/2014  . Rotator cuff (capsule) sprain Tear of rotator cuff 11/06/2014  . Cervical neuritis 11/06/2014  . Lumbar and sacral arthritis 11/06/2014    Past Surgical History:  Procedure Laterality Date  . CATARACT EXTRACTION, BILATERAL    . ENDOMETRIAL ABLATION  01-2011  . SHOULDER SURGERY Left 07-1999, 07-2011   x 4     OB History   None      Home Medications    Prior to Admission medications   Medication Sig Start Date End Date Taking? Authorizing Provider  atorvastatin (LIPITOR) 20 MG tablet Take 20 mg by mouth daily. 05/31/17  Yes [provider]  BELBUCA 150 MCG FILM Place 75-150 mcg inside cheek 2 (two) times daily.  07/05/17  Yes [provider]  chlorthalidone (HYGROTON) 25 MG tablet Take 25 mg by mouth daily.   Yes [provider]  Dextran 70-Hypromellose, PF, (ARTIFICIAL TEARS PF) 0.1-0.3 % SOLN Place 1-2 drops into both eyes daily as needed (for dryness or irritation).   Yes [provider]  FLUoxetine (PROZAC) 40 MG capsule Take 40 mg by mouth daily. 06/09/17  Yes [provider]  loratadine (CLARITIN) 10 MG tablet Take 10 mg by mouth daily.  11/26/17  Yes [provider]  metFORMIN (GLUCOPHAGE-XR) 500 MG 24 hr tablet Take 500 mg by mouth daily with breakfast.   Yes [provider]  NARCAN 4 MG/0.1ML LIQD nasal spray kit Place 4 mg into the nose once as needed (for opioid crisis).  05/20/17  Yes [provider]  QUEtiapine Fumarate (SEROQUEL XR) 150 MG 24 hr tablet Take 150 mg by mouth at bedtime.   Yes [provider]    Family History Family History  Problem Relation Age of Onset  . Diabetes Father   . Hyperlipidemia Father   . Hypertension Father     Social History Social History   Tobacco Use  . Smoking status: Never Smoker  . Smokeless tobacco: Never Used  Substance Use Topics  . Alcohol use: No  . Drug use: Not on file     Allergies   Phenergan [promethazine  hcl]; Codeine; Cortisone; Lidocaine; and Oxycodone   Review of Systems Review of Systems  Constitutional: Negative for fever.  Respiratory: Negative for shortness of breath.   Cardiovascular: Negative for chest pain.  Gastrointestinal: Negative for abdominal pain and vomiting.  Neurological: Positive for syncope, weakness (chronic) and headaches.  All other systems reviewed and are negative.    Physical Exam Updated Vital Signs BP (!) 129/96   Pulse 74   Temp 98.4 F (36.9 C)   Resp 16   Ht _0  (1.753 m)   Wt 104.7 kg (230 lb 13.2 oz)   SpO2 100%   BMI 34.09 kg/m   Physical Exam  Constitutional: She is oriented to person, place, and time. She appears well-developed and well-nourished. No distress.  HENT:  Head: Normocephalic and atraumatic.  Right Ear: External ear normal.  Left Ear: External ear normal.  Nose: Nose normal.  Eyes: EOM are normal. Right eye exhibits no discharge. Left eye exhibits no discharge.  Neck: Neck supple.  Cardiovascular: Normal rate, regular rhythm and normal heart sounds.  Pulmonary/Chest: Effort normal and breath sounds normal.  Abdominal: Soft. There is no tenderness.  Neurological: She is alert and oriented to person, place, and time.  CN 3-12 grossly intact save for facial droop on right side. 5/5 strength RUE, RLE. 4/5 strength in LUE, LLE. Grossly normal sensation. Normal finger to nose.   Skin: Skin is warm and dry. She is not diaphoretic.  Nursing note and vitals reviewed.    ED Treatments / Results  Labs (all labs ordered are listed, but only abnormal results are displayed) Labs Reviewed  COMPREHENSIVE METABOLIC PANEL - Abnormal; Notable for the following components:      Result Value   Potassium 3.2 (*)    Glucose, Bld 117 (*)    BUN 21 (*)    Total Protein 6.3 (*)    Albumin 3.3 (*)    All other components within normal limits  CBG MONITORING, ED - Abnormal; Notable for the following components:   Glucose-Capillary 109  (*)    All other components within normal limits  I-STAT CHEM 8, ED - Abnormal; Notable for the following components:   Potassium 3.1 (*)    BUN 22 (*)    Glucose, Bld 115 (*)    All other components within normal limits  PROTIME-INR  APTT  CBC  DIFFERENTIAL  I-STAT TROPONIN, ED  I-STAT BETA HCG BLOOD, ED (MC, WL, AP ONLY)  CBG MONITORING, ED    EKG EKG Interpretation  Date/Time:  Wednesday December 08 2017 20:55:39 EDT Ventricular Rate:  67 PR Interval:    QRS Duration: 74 QT Interval:  402 QTC Calculation: 425 R Axis:   70 Text Interpretation:  Sinus rhythm ST elev, probable normal  early repol pattern Baseline wander in lead(s) V1 V2 V6 no significant change since 2015 Confirmed by Sherwood Gambler [phone]) on 12/08/2017 11:09:33 PM   Radiology Mr Jeri Cos Wo Contrast  Result Date: 12/08/2017 CLINICAL DATA:  Facial droop and slurred speech. EXAM: MRI HEAD WITHOUT AND WITH CONTRAST TECHNIQUE: Multiplanar, multiecho pulse sequences of the brain and surrounding structures were obtained without and with intravenous contrast. CONTRAST:  24m MULTIHANCE GADOBENATE DIMEGLUMINE 529 MG/ML IV SOLN COMPARISON:  Head CT 12/08/2017 FINDINGS: BRAIN: The midline structures are normal. There is no acute infarct or acute hemorrhage. There is no mass lesion or other mass effect. There is no hydrocephalus, dural abnormality or extra-axial collection. The white matter signal is normal for the patient's age. No age-advanced or lobar predominant atrophy. No chronic microhemorrhage or superficial siderosis. Redemonstration of calcification in the right cerebellar hemisphere. VASCULAR: Major intracranial arterial and venous sinus flow voids are preserved. SKULL AND UPPER CERVICAL SPINE: The visualized skull base, calvarium, upper cervical spine and extracranial soft tissues are normal. SINUSES/ORBITS: No fluid levels or advanced mucosal thickening. No mastoid or middle ear effusion. The orbits are normal.  IMPRESSION: Negative MRI brain. Electronically Signed   By: KUlyses JarredM.D.   On: 12/08/2017 23:34   Ct Head Code Stroke Wo Contrast  Result Date: 12/08/2017 CLINICAL DATA:  Code stroke. Facial droop and weakness. History of cluster headache, hypertension, diabetes, tuberous sclerosis. EXAM: CT HEAD WITHOUT CONTRAST TECHNIQUE: Contiguous axial images were obtained from the base of the skull through the vertex without intravenous contrast. COMPARISON:  None. FINDINGS: BRAIN: No intraparenchymal hemorrhage, mass effect nor midline shift. The ventricles and sulci are normal. No acute large vascular territory infarcts. Subcentimeter calcification right cerebellum, nonspecific for tuber. No abnormal extra-axial fluid collections. Basal cisterns are patent. VASCULAR: Unremarkable. SKULL/SOFT TISSUES: No skull fracture. No significant soft tissue swelling. ORBITS/SINUSES: The included ocular globes and orbital contents are normal.Mild paranasal sinus mucosal thickening. Status post bilateral ocular lens implants. OTHER: None. ASPECTS ([facility] Early CT Score) - Ganglionic level infarction (caudate, lentiform nuclei, internal capsule, insula, M1-M3 cortex): 7 - Supraganglionic infarction (M4-M6 cortex): 3 Total score (0-10 with 10 being normal): 10 IMPRESSION: 1. Negative noncontrast CT head. 2. ASPECTS is 10. 3. Critical Value/emergent results text paged to Dr.Lillan Mccreadie via AMION secure system on 12/08/2017 at 8:44 pm, including interpreting physician's phone number. Electronically Signed   By: CElon AlasM.D.   On: 12/08/2017 20:46    Procedures Procedures (including critical care time)  Medications Ordered in ED Medications  potassium chloride SA (K-DUR,KLOR-CON) CR tablet 40 mEq (40 mEq Oral Given 12/09/17 0010)  LORazepam (ATIVAN) injection 1 mg (1 mg Intravenous Given 12/08/17 2231)  gadobenate dimeglumine (MULTIHANCE) injection 20 mL (20 mLs Intravenous Contrast Given 12/08/17 2315)       Initial Impression / Assessment and Plan / ED Course  I have reviewed the triage vital signs and the nursing notes.  Pertinent labs & imaging results that were available during my care of the patient were reviewed by me and considered in my medical decision making (see chart for details).     Patient's presentation is atypical.  MRI obtained after consultation with neurology given negative head CT.  This is also negative.  He has reevaluated and feels she stable for discharge.  I agree.  The facial droop is the only significant finding I can find and it is inconsistent.  Seems to come and go.  There is no forehead palsy  or eye lid droop to suggest Bell's palsy.  This is likely not neurologic in origin.  Patient is not sure she actually passed out but was dizzy today.  She has had headaches and dizziness before from low blood sugar.  When correlating her meter (1 out of 3 that she owns) with glucometer here, it is well off and multiple 100 units higher.  I have advised her to get rid of this 1 and use a different one.  She does not appear to have an acute medical emergency at this time and appears stable for discharge home.  Hypokalemia repleted in the ED.   Final Clinical Impressions(s) / ED Diagnoses   Final diagnoses:  Occipital headache  Dizziness    ED Discharge Orders    None       Sherwood Gambler, MD 12/09/17 [phone]

## 2017-12-08 NOTE — ED Notes (Signed)
CBG 109, RN aware

## 2017-12-08 NOTE — Consult Note (Signed)
Requesting Physician: Dr. Regenia Skeeter     Chief Complaint: Syncope, Right facial droop  History obtained from: Patient and Chart     HPI:                                                                                                                                       Vickie Stevens is an 47 y.o. female past medical history of diabetes mellitus, headaches, Crohn's disease, depression with newly diagnosed diabetic neuropathy-presents to the emergency room after noting to have right facial droop after syncopal event in urgent care.    Last known normal was around 6:45 PM the patient developed a headache suddenly.  She thought that she may be hypoglycemic-were noted that her blood sugar was high and not reading on the fingerstick.  She went to  urgent care Where she may have passed out twice ( unclear if she completely lost consciousness).  There she was noted to have a right facial droop and slurred speech and sent to the ER.  On arrival patient appeared to have a right facial droop, however this seemed to fluctuate at times.  She had no other focal neurological deficits and hence not a candidate for TPA due to mild deficits.  CT head was unremarkable.  An MRI brain was performed which also showed no evidence of any acute infarct.  Date last known well: 6.5.19 Time last known well: 6:45 PM tPA Given: No, symptoms to mild-suspicion for stroke NIHSS: 1 Baseline MRS 0  Past Medical History:  Diagnosis Date  . ADHD (attention deficit hyperactivity disorder)   . Biceps tendonitis   . Bipolar disorder ([place])   . Chronic back pain   . Cluster headache   . Crohn's disease ([place])   . DDD (degenerative disc disease), lumbar   . Depression   . Diabetes mellitus without complication ([place])   . Hypercholesteremia   . Hypertension   . PCOS (polycystic ovarian syndrome)   . Rotator cuff tendinitis   . Sleep apnea   . 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 CATARACT EXTRACTION, BILATERAL    . ENDOMETRIAL ABLATION  01-2011  . SHOULDER SURGERY Left 07-1999, 07-2011   x 4    Family History  Problem Relation Age of Onset  . Diabetes Father   . Hyperlipidemia Father   . Hypertension Father    Social History:  reports that she has never smoked. She has never used smokeless tobacco. She reports that she does not drink alcohol. Her drug history is not on file.  Allergies:  Allergies  Allergen Reactions  . Phenergan [Promethazine Hcl] Other (See Comments)    Altered mental status; patient remarked that she ripped out her IV and tore up the hospital room  . Codeine Hives  . Cortisone Other (See Comments)    Caused cataracts  . Lidocaine  Hives    With oral, only  . Oxycodone Diarrhea and Rash    Pruritic rash, also    Medications:                                                                                                                        I reviewed home medications   ROS:                                                                                                                                     14 systems reviewed and negative except above    Examination:                                                                                                      General: Appears well-developed and well-nourished.  Psych: Affect appropriate to situation Eyes: No scleral injection HENT: No OP obstrucion Head: Normocephalic.  Cardiovascular: Normal rate and regular rhythm.  Respiratory: Effort normal and breath sounds normal to anterior ascultation GI: Soft.  No distension. There is no tenderness.  Skin: WDI   Neurological Examination Mental Status: Alert, oriented, thought content appropriate.  Speech fluent without evidence of aphasia. Able to follow 3 step commands without difficulty. Cranial Nerves: II: Visual fields grossly normal,  III,IV, VI: ptosis not present, extra-ocular motions intact bilaterally, pupils  equal, round, reactive to light and accommodation V,VII: smile symmetric, facial light touch sensation normal bilaterally VIII: hearing normal bilaterally IX,X: uvula rises symmetrically XI: bilateral shoulder shrug XII: midline tongue extension Motor: Right : Upper extremity   5/5    Left:     Upper extremity   4+/5  Lower extremity   5/5     Lower extremity   5/5 Tone and bulk:normal tone throughout; no atrophy noted Sensory: Pinprick and light touch intact throughout, bilaterally Deep Tendon Reflexes: 2+ and symmetric throughout Plantars: Right: downgoing   Left: downgoing Cerebellar: normal finger-to-nose, normal rapid alternating movements and normal heel-to-shin test Gait: normal  gait and station     Lab Results: Basic Metabolic Panel: Recent Labs  Lab 12/08/17 2023 12/08/17 Sep 05, 2024  NA 139 139  K 3.2* 3.1*  CL 107 103  CO2 23  --   GLUCOSE 117* 115*  BUN 21* 22*  CREATININE 0.67 0.60  CALCIUM 8.9  --     CBC: Recent Labs  Lab 12/08/17 2023 12/08/17 2024/09/05  WBC 8.0  --   NEUTROABS 4.2  --   HGB 13.6 13.6  HCT 41.7 40.0  MCV 86.3  --   PLT 325  --     Coagulation Studies: Recent Labs    12/08/17 09/05/2021  LABPROT 12.8  INR 0.97    Imaging: Mr Jeri Cos GG Contrast  Result Date: 12/08/2017 CLINICAL DATA:  Facial droop and slurred speech. EXAM: MRI HEAD WITHOUT AND WITH CONTRAST TECHNIQUE: Multiplanar, multiecho pulse sequences of the brain and surrounding structures were obtained without and with intravenous contrast. CONTRAST:  94m MULTIHANCE GADOBENATE DIMEGLUMINE 529 MG/ML IV SOLN COMPARISON:  Head CT 12/08/2017 FINDINGS: BRAIN: The midline structures are normal. There is no acute infarct or acute hemorrhage. There is no mass lesion or other mass effect. There is no hydrocephalus, dural abnormality or extra-axial collection. The white matter signal is normal for the patient's age. No age-advanced or lobar predominant atrophy. No chronic microhemorrhage or  superficial siderosis. Redemonstration of calcification in the right cerebellar hemisphere. VASCULAR: Major intracranial arterial and venous sinus flow voids are preserved. SKULL AND UPPER CERVICAL SPINE: The visualized skull base, calvarium, upper cervical spine and extracranial soft tissues are normal. SINUSES/ORBITS: No fluid levels or advanced mucosal thickening. No mastoid or middle ear effusion. The orbits are normal. IMPRESSION: Negative MRI brain. Electronically Signed   By: KUlyses JarredM.D.   On: 12/08/2017 23:34   Ct Head Code Stroke Wo Contrast  Result Date: 12/08/2017 CLINICAL DATA:  Code stroke. Facial droop and weakness. History of cluster headache, hypertension, diabetes, tuberous sclerosis. EXAM: CT HEAD WITHOUT CONTRAST TECHNIQUE: Contiguous axial images were obtained from the base of the skull through the vertex without intravenous contrast. COMPARISON:  None. FINDINGS: BRAIN: No intraparenchymal hemorrhage, mass effect nor midline shift. The ventricles and sulci are normal. No acute large vascular territory infarcts. Subcentimeter calcification right cerebellum, nonspecific for tuber. No abnormal extra-axial fluid collections. Basal cisterns are patent. VASCULAR: Unremarkable. SKULL/SOFT TISSUES: No skull fracture. No significant soft tissue swelling. ORBITS/SINUSES: The included ocular globes and orbital contents are normal.Mild paranasal sinus mucosal thickening. Status post bilateral ocular lens implants. OTHER: None. ASPECTS ([facility] Early CT Score) - Ganglionic level infarction (caudate, lentiform nuclei, internal capsule, insula, M1-M3 cortex): 7 - Supraganglionic infarction (M4-M6 cortex): 3 Total score (0-10 with 10 being normal): 10 IMPRESSION: 1. Negative noncontrast CT head. 2. ASPECTS is 10. 3. Critical Value/emergent results text paged to Dr.SCOTT GOLDSTON via AMION secure system on 12/08/2017 at 8:44 pm, including interpreting physician's phone number.  Electronically Signed   By: CElon AlasM.D.   On: 12/08/2017 20:46     ASSESSMENT AND PLAN  47y.o. female past medical history of diabetes mellitus, headaches, Crohn's disease, depression with newly diagnosed diabetic neuropathy-presented as a stroke alert due to right facial droop and slurred speech. Her symptoms may be from complicated migraine versus conversion (at times the facial droop seemed inconsistent). MRI brain with contrast was also performed-no evidence of MS/lesions to explain right facial droop.  She was able to close her eyelids and raise eyebrows, for not  consistent with Bell's palsy  Right facial droop  Recommendations Ok to be discharged from neurological standpoint And follow-up with outpatient neurologist if symptoms recur   Vermon Grays [facility] Pager Number [phone]

## 2017-12-09 DIAGNOSIS — R51 Headache: Secondary | ICD-10-CM | POA: Diagnosis not present

## 2017-12-09 NOTE — ED Notes (Signed)
E-signature not available, pt verbalized understanding of DC instructions. Ambulatory, declined wheelchair.

## 2017-12-09 NOTE — ED Triage Notes (Signed)
Pt to ED via [facility] from a urgent care ref. Code Stroke.  EMS reports pt was being seen at Urgent Care when she had a syncopal episode then started to have a right side facial droop.  On arrival to ED pt alert and oriented x's 4.  Pt st's prior to syncopal episode she developed a headache which she rates a #3 on pain scale 0/10.

## 2018-01-12 ENCOUNTER — Encounter: Admission: RE | Payer: Self-pay | Source: Ambulatory Visit

## 2018-01-12 ENCOUNTER — Ambulatory Visit: Admission: RE | Admit: 2018-01-12 | Payer: Medicare PPO | Source: Ambulatory Visit | Admitting: Internal Medicine

## 2018-01-12 SURGERY — ESOPHAGOGASTRODUODENOSCOPY (EGD) WITH PROPOFOL
Anesthesia: General

## 2018-02-23 DIAGNOSIS — G7111 Myotonic muscular dystrophy: Secondary | ICD-10-CM | POA: Insufficient documentation

## 2018-02-24 DIAGNOSIS — E119 Type 2 diabetes mellitus without complications: Secondary | ICD-10-CM | POA: Insufficient documentation

## 2018-02-24 DIAGNOSIS — G7111 Myotonic muscular dystrophy: Secondary | ICD-10-CM | POA: Insufficient documentation

## 2018-02-24 DIAGNOSIS — I10 Essential (primary) hypertension: Secondary | ICD-10-CM | POA: Insufficient documentation

## 2018-02-24 DIAGNOSIS — R55 Syncope and collapse: Secondary | ICD-10-CM | POA: Insufficient documentation

## 2018-02-24 DIAGNOSIS — K5090 Crohn's disease, unspecified, without complications: Secondary | ICD-10-CM | POA: Insufficient documentation

## 2018-06-08 ENCOUNTER — Encounter
Admission: RE | Disposition: A | Discharge status: Home / Self Care | Payer: Self-pay | Source: Ambulatory Visit | Attending: Gastroenterology

## 2018-06-08 ENCOUNTER — Encounter: Payer: Self-pay | Admitting: Anesthesiology

## 2018-06-08 ENCOUNTER — Ambulatory Visit
Admission: RE | Admit: 2018-06-08 | Discharge: 2018-06-08 | Disposition: A | Discharge status: Home / Self Care | Payer: Medicare PPO | Source: Ambulatory Visit | Attending: Gastroenterology | Admitting: Gastroenterology

## 2018-06-08 ENCOUNTER — Ambulatory Visit: Payer: Medicare PPO | Admitting: Anesthesiology

## 2018-06-08 DIAGNOSIS — K5909 Other constipation: Secondary | ICD-10-CM | POA: Diagnosis not present

## 2018-06-08 DIAGNOSIS — E119 Type 2 diabetes mellitus without complications: Secondary | ICD-10-CM | POA: Insufficient documentation

## 2018-06-08 DIAGNOSIS — K5090 Crohn's disease, unspecified, without complications: Secondary | ICD-10-CM | POA: Diagnosis not present

## 2018-06-08 DIAGNOSIS — R1013 Epigastric pain: Secondary | ICD-10-CM | POA: Insufficient documentation

## 2018-06-08 DIAGNOSIS — D125 Benign neoplasm of sigmoid colon: Secondary | ICD-10-CM | POA: Diagnosis not present

## 2018-06-08 DIAGNOSIS — K2210 Ulcer of esophagus without bleeding: Secondary | ICD-10-CM | POA: Insufficient documentation

## 2018-06-08 DIAGNOSIS — K2960 Other gastritis without bleeding: Secondary | ICD-10-CM | POA: Insufficient documentation

## 2018-06-08 HISTORY — PX: ESOPHAGOGASTRODUODENOSCOPY (EGD) WITH PROPOFOL: SHX5813

## 2018-06-08 HISTORY — PX: COLONOSCOPY WITH PROPOFOL: SHX5780

## 2018-06-08 LAB — POCT PREGNANCY, URINE: PREG TEST UR: NEGATIVE

## 2018-06-08 LAB — GLUCOSE, CAPILLARY: GLUCOSE-CAPILLARY: 91 mg/dL (ref 70–99)

## 2018-06-08 SURGERY — ESOPHAGOGASTRODUODENOSCOPY (EGD) WITH PROPOFOL
Anesthesia: General

## 2018-06-08 MED ORDER — PROPOFOL 500 MG/50ML IV EMUL
INTRAVENOUS | Status: DC | PRN
  Administered 2018-06-08: 140 ug/kg/min via INTRAVENOUS

## 2018-06-08 MED ORDER — SODIUM CHLORIDE 0.9 % IV SOLN
INTRAVENOUS | Status: DC | PRN
  Administered 2018-06-08: 14:00:00 via INTRAVENOUS

## 2018-06-08 MED ORDER — SODIUM CHLORIDE 0.9 % IV SOLN
INTRAVENOUS | Status: DC
  Administered 2018-06-08: 1000 mL via INTRAVENOUS

## 2018-06-08 MED ORDER — PROPOFOL 10 MG/ML IV BOLUS
INTRAVENOUS | Status: DC | PRN
  Administered 2018-06-08: 50 mg via INTRAVENOUS

## 2018-06-08 MED ORDER — LIDOCAINE HCL (PF) 1 % IJ SOLN
INTRAMUSCULAR | Status: AC
  Administered 2018-06-08: 0.3 mL
  Filled 2018-06-08: qty 2

## 2018-06-08 NOTE — H&P (Signed)
Outpatient short stay form Pre-procedure 06/08/2018 12:09 PM Vickie Sails MD  Primary Physician: Dr [name]  Reason for visit: EGD and colonoscopy  History of present illness: Patient is a 47 year old female presenting today for evaluation regards her previous history of Crohn's disease.  Her main complaint right now though is that of some chronic constipation.  Was diagnosed with Crohn's disease at about the age of 5.  She had significant symptoms for several years requiring multiple doses of steroids and Rowasa enemas.  She is not currently on occasions for Crohn's or inflammatory bowel disease and her last flare apparently was in 2006 or 2007.  Has been seeing some stool that has blood with it.  Also has intermittent problems with severe nausea however most of this result was related to taking metformin.  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  No family history of inflammatory bowel disease.  She takes no aspirin or blood thinning agent.  She tolerated her prep.    Current Facility-Administered Medications:  .  0.9 %  sodium chloride infusion, , Intravenous, Continuous, Vickie Sails, MD  Facility-Administered Medications Prior to Admission  Medication Dose Route Frequency Provider Last Rate Last Dose  . bupivacaine (MARCAINE) 0.5 % injection 30 mL  30 mL Other Once Mohammed Kindle, MD      . bupivacaine (PF) (MARCAINE) 0.25 % injection 30 mL  30 mL Other Once Mohammed Kindle, MD      . lactated ringers infusion 1,000 mL  1,000 mL Intravenous Continuous Mohammed Kindle, MD      . orphenadrine (NORFLEX) injection 60 mg  60 mg Intramuscular Once Mohammed Kindle, MD      . triamcinolone acetonide (KENALOG-40) injection 40 mg  40 mg Other Once Mohammed Kindle, MD       Medications Prior to Admission  Medication Sig Dispense Refill Last Dose  . atorvastatin (LIPITOR) 20 MG tablet Take 20 mg by mouth daily.  0 12/08/2017 at 1000  . BELBUCA 150 MCG FILM Place  75-150 mcg inside cheek 2 (two) times daily.   0 12/08/2017 at 1000  . chlorthalidone (HYGROTON) 25 MG tablet Take 25 mg by mouth daily.   12/08/2017 at Unknown time  . Dextran 70-Hypromellose, PF, (ARTIFICIAL TEARS PF) 0.1-0.3 % SOLN Place 1-2 drops into both eyes daily as needed (for dryness or irritation).   Past Week at Unknown time  . FLUoxetine (PROZAC) 40 MG capsule Take 40 mg by mouth daily.  0 12/08/2017 at 1000  . loratadine (CLARITIN) 10 MG tablet Take 10 mg by mouth daily.    Unk at [facility]  . metFORMIN (GLUCOPHAGE-XR) 500 MG 24 hr tablet Take 500 mg by mouth daily with breakfast.   12/08/2017 at Unknown time  . NARCAN 4 MG/0.1ML LIQD nasal spray kit Place 4 mg into the nose once as needed (for opioid crisis).   0 Unk at Unk  . QUEtiapine Fumarate (SEROQUEL XR) 150 MG 24 hr tablet Take 150 mg by mouth at bedtime.   12/07/2017 at pm     Allergies  Allergen Reactions  . Phenergan [Promethazine Hcl] Other (See Comments)    Altered mental status; patient remarked that she ripped out her IV and tore up the hospital room  . Codeine Hives  . Cortisone Other (See Comments)    Caused cataracts  . Lidocaine Hives    With oral, only  . Oxycodone Diarrhea and Rash    Pruritic rash, also     Past  Medical History:  Diagnosis Date  . ADHD (attention deficit hyperactivity disorder)   . Biceps tendonitis   . Bipolar disorder ([place])   . Chronic back pain   . Cluster headache   . Crohn's disease ([place])   . DDD (degenerative disc disease), lumbar   . Depression   . Diabetes mellitus without complication ([place])   . Hypercholesteremia   . Hypertension   . PCOS (polycystic ovarian syndrome)   . Rotator cuff tendinitis   . Sleep apnea   . 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    HEENT: Normocephalic atraumatic eyes are anicteric    Other:    Pertinant exam for  procedure: Soft mild discomfort to palpation left lower quadrant.  There are no masses or rebound.  Bowel sounds are positive normoactive.  There are no high-pitched sounds.    Planned proceedures: EGD, colonoscopy and indicated procedures. I have discussed the risks benefits and complications of procedures to include not limited to bleeding, infection, perforation and the risk of sedation and the patient wishes to proceed.    Vickie Sails, MD Gastroenterology 06/08/2018  12:09 PM

## 2018-06-08 NOTE — Anesthesia Post-op Follow-up Note (Signed)
Anesthesia QCDR form completed.        

## 2018-06-08 NOTE — Transfer of Care (Signed)
Immediate Anesthesia Transfer of Care Note  Patient: Vickie Stevens  Procedure(s) Performed: ESOPHAGOGASTRODUODENOSCOPY (EGD) WITH PROPOFOL (N/A ) COLONOSCOPY WITH PROPOFOL (N/A )  Patient Location: PACU and Endoscopy Unit  Anesthesia Type:General  Level of Consciousness: sedated  Airway & Oxygen Therapy: Patient Spontanous Breathing  Post-op Assessment: Report given to RN  Post vital signs: stable  Last Vitals:  Vitals Value Taken Time  BP 113/72 06/08/2018  2:14 PM  Temp    Pulse 80 06/08/2018  2:14 PM  Resp 14 06/08/2018  2:14 PM  SpO2 100 % 06/08/2018  2:14 PM  Vitals shown include unvalidated device data.  Last Pain:  Vitals:   06/08/18 1218  TempSrc: Tympanic  PainSc: 6          Complications: No apparent anesthesia complications

## 2018-06-08 NOTE — Anesthesia Postprocedure Evaluation (Signed)
Anesthesia Post Note  Patient: Vickie Stevens  Procedure(s) Performed: ESOPHAGOGASTRODUODENOSCOPY (EGD) WITH PROPOFOL (N/A ) COLONOSCOPY WITH PROPOFOL (N/A )  Patient location during evaluation: Endoscopy Anesthesia Type: General Level of consciousness: awake and alert Pain management: pain level controlled Vital Signs Assessment: post-procedure vital signs reviewed and stable Respiratory status: spontaneous breathing and respiratory function stable Cardiovascular status: stable Anesthetic complications: no     Last Vitals:  Vitals:   06/08/18 1218 06/08/18 1414  BP: (!) 130/93   Pulse: 82   Resp: 16   Temp: 36.6 C (!) 36.2 C  SpO2: 100%     Last Pain:  Vitals:   06/08/18 1414  TempSrc:   PainSc: Asleep                 KEPHART,WILLIAM K

## 2018-06-08 NOTE — Anesthesia Preprocedure Evaluation (Signed)
Anesthesia Evaluation  Patient identified by MRN, date of birth, ID band Patient awake    Reviewed: Allergy & Precautions, NPO status , Patient's Chart, lab work & pertinent test results, reviewed documented beta blocker date and time   Airway Mallampati: III  TM Distance: >3 FB     Dental  (+) Chipped   Pulmonary sleep apnea ,           Cardiovascular hypertension, Pt. on medications      Neuro/Psych  Headaches, PSYCHIATRIC DISORDERS Depression Bipolar Disorder  Neuromuscular disease    GI/Hepatic   Endo/Other  diabetes, Type 2  Renal/GU      Musculoskeletal  (+) Arthritis ,   Abdominal   Peds  Hematology   Anesthesia Other Findings Obese.  Reproductive/Obstetrics                             Anesthesia Physical Anesthesia Plan  ASA: II  Anesthesia Plan: General   Post-op Pain Management:    Induction: Intravenous  PONV Risk Score and Plan:   Airway Management Planned:   Additional Equipment:   Intra-op Plan:   Post-operative Plan:   Informed Consent: I have reviewed the patients History and Physical, chart, labs and discussed the procedure including the risks, benefits and alternatives for the proposed anesthesia with the patient or authorized representative who has indicated his/her understanding and acceptance.     Plan Discussed with: CRNA  Anesthesia Plan Comments:         Anesthesia Quick Evaluation

## 2018-06-09 ENCOUNTER — Encounter: Payer: Self-pay | Admitting: Gastroenterology

## 2018-06-09 NOTE — Op Note (Signed)
[facility] Gastroenterology Patient Name: Vickie Stevens Procedure Date: 06/08/2018 1:04 PM MRN: [id number] Account #: [id number] Date of Birth: 1970-10-10 Admit Type: Outpatient Age: 47 Room: [facility] ENDO ROOM 2 Gender: Female Note Status: Finalized Procedure:            Upper GI endoscopy Indications:          Epigastric abdominal pain, Crohn's disease, Nausea Providers:            Lollie Sails, MD Referring MD:         Venetia Maxon. Elijio Miles, MD (Referring MD) Medicines:            Monitored Anesthesia Care Complications:        No immediate complications. Procedure:            Pre-Anesthesia Assessment:                       - ASA Grade Assessment: III - A patient with severe                        systemic disease.                       After obtaining informed consent, the endoscope was                        passed under direct vision. Throughout the procedure,                        the patient's blood pressure, pulse, and oxygen                        saturations were monitored continuously. The Endoscope                        was introduced through the mouth, and advanced to the                        third part of duodenum. The patient tolerated the                        procedure well. Findings:      LA Grade A (one or more mucosal breaks less than 5 mm, not extending       between tops of 2 mucosal folds) esophagitis with no bleeding was found.       Biopsies were taken with a cold forceps for histology.      The exam of the esophagus was otherwise normal.      Segmental mild inflammation characterized by erythema and linear       erosions was found in the gastric antrum. Biopsies were taken with a       cold forceps for histology.      The exam of the stomach was otherwise normal.      The cardia and gastric fundus were normal on retroflexion.      Biopsies were taken with a cold forceps in the gastric body for       histology.      The cardia  and gastric fundus were normal on retroflexion.      Diffuse mild mucosal variance characterized by smoothness was found in       the entire  duodenum. Biopsies were taken with a cold forceps for       histology.      The exam of the duodenum was otherwise normal. Impression:           - LA Grade A erosive esophagitis. Biopsied.                       - Erosive gastritis. Biopsied.                       - Biopsies were taken with a cold forceps for histology                        in the gastric body. Recommendation:       - Await pathology results.                       - Return to GI clinic in 3 weeks. Lollie Sails, MD 06/08/2018 1:31:14 PM This report has been signed electronically. Number of Addenda: 0 Note Initiated On: 06/08/2018 1:04 PM      [facility]

## 2018-06-09 NOTE — Op Note (Signed)
[facility] Gastroenterology Patient Name: Vickie Stevens Procedure Date: 06/08/2018 1:04 PM MRN: [id number] Account #: [id number] Date of Birth: 02/06/1971 Admit Type: Outpatient Age: 47 Room: [facility] ENDO ROOM 2 Gender: Female Note Status: Finalized Procedure:            Colonoscopy Indications:          Lower abdominal pain, Personal history of Crohn's                        disease Providers:            Lollie Sails, MD Referring MD:         Venetia Maxon. Elijio Miles, MD (Referring MD) Medicines:            Monitored Anesthesia Care Complications:        No immediate complications. Procedure:            Pre-Anesthesia Assessment:                       - ASA Grade Assessment: III - A patient with severe                        systemic disease.                       After obtaining informed consent, the colonoscope was                        passed under direct vision. Throughout the procedure,                        the patient's blood pressure, pulse, and oxygen                        saturations were monitored continuously. The                        Colonoscope was introduced through the anus and                        advanced to the the terminal ileum. The colonoscopy was                        performed without difficulty. The patient tolerated the                        procedure well. The quality of the bowel preparation                        was good. Findings:      Five sessile polyps were found in the recto-sigmoid colon. The polyps       were 1 to 3 mm in size. These polyps were removed with a cold biopsy       forceps. Resection and retrieval were complete.      The exam was otherwise normal throughout the examined colon.      The retroflexed view of the distal rectum and anal verge was normal and       showed no anal or rectal abnormalities.      The terminal ileum appeared normal. Biopsies were taken with a cold  forceps for histology.   Biopsies for histology were taken with a cold forceps from the cecum,       ascending colon, transverse colon, descending colon, sigmoid colon and       rectum for evaluation of microscopic colitis.      The exam was otherwise without abnormality.      The digital rectal exam was normal. Impression:           - Five 1 to 3 mm polyps at the recto-sigmoid colon,                        removed with a cold biopsy forceps. Resected and                        retrieved.                       - The distal rectum and anal verge are normal on                        retroflexion view.                       - The examined portion of the ileum was normal.                        Biopsied.                       - The examination was otherwise normal.                       - Biopsies were taken with a cold forceps from the                        cecum, ascending colon, transverse colon, descending                        colon, sigmoid colon and rectum for evaluation of                        microscopic colitis. Recommendation:       - Await pathology results.                       - Return to GI clinic in 3 weeks. Lollie Sails, MD 06/08/2018 2:14:28 PM This report has been signed electronically. Number of Addenda: 0 Note Initiated On: 06/08/2018 1:04 PM Scope Withdrawal Time: 0 hours 22 minutes 37 seconds  Total Procedure Duration: 0 hours 30 minutes 33 seconds       [facility]

## 2018-06-10 LAB — SURGICAL PATHOLOGY

## 2018-07-28 ENCOUNTER — Ambulatory Visit: Payer: Self-pay | Admitting: Student in an Organized Health Care Education/Training Program

## 2018-08-01 DIAGNOSIS — Z79891 Long term (current) use of opiate analgesic: Secondary | ICD-10-CM | POA: Insufficient documentation

## 2018-08-04 ENCOUNTER — Ambulatory Visit
Payer: Medicare PPO | Attending: Student in an Organized Health Care Education/Training Program | Admitting: Student in an Organized Health Care Education/Training Program

## 2018-08-04 ENCOUNTER — Telehealth: Payer: Self-pay | Admitting: Student in an Organized Health Care Education/Training Program

## 2018-08-04 ENCOUNTER — Encounter: Payer: Self-pay | Admitting: Student in an Organized Health Care Education/Training Program

## 2018-08-04 ENCOUNTER — Other Ambulatory Visit: Payer: Self-pay

## 2018-08-04 VITALS — BP 118/93 | HR 107 | Temp 99.0°F | Resp 18 | Ht 69.0 in | Wt 227.4 lb

## 2018-08-04 DIAGNOSIS — M19112 Post-traumatic osteoarthritis, left shoulder: Secondary | ICD-10-CM | POA: Diagnosis present

## 2018-08-04 DIAGNOSIS — F329 Major depressive disorder, single episode, unspecified: Secondary | ICD-10-CM | POA: Diagnosis present

## 2018-08-04 DIAGNOSIS — M5030 Other cervical disc degeneration, unspecified cervical region: Secondary | ICD-10-CM | POA: Insufficient documentation

## 2018-08-04 DIAGNOSIS — F32A Depression, unspecified: Secondary | ICD-10-CM

## 2018-08-04 DIAGNOSIS — M5412 Radiculopathy, cervical region: Secondary | ICD-10-CM | POA: Diagnosis present

## 2018-08-04 DIAGNOSIS — M47817 Spondylosis without myelopathy or radiculopathy, lumbosacral region: Secondary | ICD-10-CM | POA: Diagnosis present

## 2018-08-04 DIAGNOSIS — F4323 Adjustment disorder with mixed anxiety and depressed mood: Secondary | ICD-10-CM

## 2018-08-04 DIAGNOSIS — G7111 Myotonic muscular dystrophy: Secondary | ICD-10-CM | POA: Insufficient documentation

## 2018-08-04 DIAGNOSIS — M19111 Post-traumatic osteoarthritis, right shoulder: Secondary | ICD-10-CM | POA: Diagnosis present

## 2018-08-04 DIAGNOSIS — S43421D Sprain of right rotator cuff capsule, subsequent encounter: Secondary | ICD-10-CM

## 2018-08-04 DIAGNOSIS — M5481 Occipital neuralgia: Secondary | ICD-10-CM | POA: Diagnosis present

## 2018-08-04 DIAGNOSIS — G894 Chronic pain syndrome: Secondary | ICD-10-CM | POA: Diagnosis present

## 2018-08-04 DIAGNOSIS — M6281 Muscle weakness (generalized): Secondary | ICD-10-CM | POA: Insufficient documentation

## 2018-08-04 NOTE — Telephone Encounter (Signed)
Called patient to let her know that we can not send a PA for a medication that Dr Primus Bravo wrote.    I did tell her that I would give the the name of the device, Neuro Stim, to Dr Holley Raring as they had discussed this at her visit earlier today.

## 2018-08-04 NOTE — Progress Notes (Signed)
Safety precautions to be maintained throughout the outpatient stay will include: orient to surroundings, keep bed in low position, maintain call bell within reach at all times, provide assistance with transfer out of bed and ambulation.  

## 2018-08-04 NOTE — Progress Notes (Signed)
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 habitus Posture: WNL   Lower Extremity Exam    Side: Right lower extremity  Side: Left lower extremity  Stability: No instability observed  Stability: No instability observed          Skin & Extremity Inspection: Skin color, temperature, and hair growth are WNL. No peripheral edema or cyanosis. No masses, redness, swelling, asymmetry, or associated skin lesions. No contractures.  Skin & Extremity Inspection: Skin color, temperature, and hair growth are WNL. No peripheral edema or cyanosis. No masses, redness, swelling, asymmetry, or associated skin lesions. No contractures.  Functional ROM: Unrestricted ROM                  Functional ROM: Decreased ROM                  Muscle Tone/Strength: Functionally intact. No obvious neuro-muscular anomalies detected.  Muscle Tone/Strength: Functionally intact. No obvious neuro-muscular anomalies detected.  Sensory (Neurological): Unimpaired        Sensory  (Neurological): Unimpaired        DTR: Patellar: 2+: normal Achilles: 1+: trace Plantar: deferred today  DTR: Patellar: 1+: trace Achilles: 1+: trace Plantar: deferred today  Palpation: No palpable anomalies  Palpation: No palpable anomalies   Assessment  Primary Diagnosis & Pertinent Problem List: The primary encounter diagnosis was Chronic pain syndrome. 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  Visit Diagnosis (New problems to examiner): 1. Chronic pain syndrome   2. 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 Myotonic dystrophy ([place])   4. Cervical neuritis   5. DDD (degenerative disc disease), cervical   6. Sprain of right rotator cuff capsule, subsequent encounter   7. Lumbar and sacral arthritis   8. Post-traumatic osteoarthritis of both shoulders   9. Bilateral occipital neuralgia   10. Adjustment disorder with mixed anxiety and depressed mood   11. Depression, unspecified depression type    Chronic pain secondary to myotonic muscular dystrophy.  Previously seen Dr. Primus Bravo.  However due to insurance change, is interested in transferring her care here.  Has been managed on belbuca 150 mcg twice daily.  Patient has tried lumbar facet medial branch nerve blocks in the past which she states were effective however she was told that the steroids resulted in cataracts.  Patient is also tried Nucynta 50 mg daily in the past which he states was very helpful but insurance will not cover this.  UDS today, if appropriate can consider taking over patient's chronic pain regimen of Belbuca 150 mcg  BID.  Plan of Care (Initial workup plan)  Note: Please be advised that as per protocol, today's visit has been an evaluation only. We have not taken over the patient's controlled substance management.  Ordered Lab-work, Procedure(s), Referral(s), & Consult(s): Orders Placed This Encounter  Procedures  . Compliance Drug Analysis, Ur    Pharmacological management options:  Opioid Analgesics: The patient was informed that there is no guarantee that she would be a candidate for opioid analgesics. The decision will be made following CDC guidelines. This decision will be based on the results of diagnostic studies, as well as Vickie Stevens's risk profile.   Membrane stabilizer: To be determined at a later time  Muscle relaxant: To be determined at a later time  NSAID: To be determined at a later time  Other analgesic(s): To be determined at a later time  Interventional management options: Vickie Stevens was informed that there is no guarantee that she would be a candidate for interventional therapies. The decision will be based on the results of diagnostic studies, as well as Vickie Stevens's risk profile.  Procedure(s) under consideration:  Lumbar facet medial branch nerve blocks   left shoulder joint injection   left suprascapular nerve block   Provider-requested follow-up: Return in about 2 weeks (around 08/18/2018) for Medication Management.  Future Appointments  Date Time Provider [place]  09/15/2018 12:15 PM Gillis Santa, MD [facility] None    Primary Care Physician: Jodi Marble, MD Location: [facility] Note by: Gillis Santa, M.D, Date: 08/04/2018; Time: 1:40 PM  There are no Patient Instructions on file for this visit.

## 2018-08-04 NOTE — Telephone Encounter (Signed)
Pt left a voicemail stating that Dr. Primus Bravo sent her in a prescription for belbuca 170m (i'm not sure of the spelling) and stated that it requires a PA and was wondering if Dr. LHolley Raringcould do the PA instead because Dr. CVictorio Palmoffice is closed today. Pt also stated that Dr. LHolley Raringwas asking about the name of an electrical stimulator that goes behind the ear and she stated it is called a Neurostim.

## 2018-08-09 LAB — COMPLIANCE DRUG ANALYSIS, UR

## 2018-09-05 ENCOUNTER — Other Ambulatory Visit: Payer: Self-pay | Admitting: Internal Medicine

## 2018-09-05 DIAGNOSIS — Z1231 Encounter for screening mammogram for malignant neoplasm of breast: Secondary | ICD-10-CM

## 2018-09-15 ENCOUNTER — Encounter: Payer: Self-pay | Admitting: Student in an Organized Health Care Education/Training Program

## 2018-09-19 ENCOUNTER — Ambulatory Visit
Payer: Medicare PPO | Attending: Student in an Organized Health Care Education/Training Program | Admitting: Student in an Organized Health Care Education/Training Program

## 2018-09-19 ENCOUNTER — Telehealth: Payer: Self-pay | Admitting: Student in an Organized Health Care Education/Training Program

## 2018-09-19 ENCOUNTER — Encounter: Payer: Self-pay | Admitting: Nurse Practitioner

## 2018-09-19 ENCOUNTER — Other Ambulatory Visit: Payer: Self-pay

## 2018-09-19 VITALS — BP 117/77 | HR 75 | Temp 98.5°F | Ht 69.0 in | Wt 227.0 lb

## 2018-09-19 DIAGNOSIS — Z79899 Other long term (current) drug therapy: Secondary | ICD-10-CM | POA: Diagnosis present

## 2018-09-19 DIAGNOSIS — G894 Chronic pain syndrome: Secondary | ICD-10-CM | POA: Insufficient documentation

## 2018-09-19 DIAGNOSIS — M19112 Post-traumatic osteoarthritis, left shoulder: Secondary | ICD-10-CM

## 2018-09-19 DIAGNOSIS — M47817 Spondylosis without myelopathy or radiculopathy, lumbosacral region: Secondary | ICD-10-CM | POA: Diagnosis present

## 2018-09-19 DIAGNOSIS — M19111 Post-traumatic osteoarthritis, right shoulder: Secondary | ICD-10-CM | POA: Diagnosis present

## 2018-09-19 DIAGNOSIS — M5412 Radiculopathy, cervical region: Secondary | ICD-10-CM | POA: Diagnosis present

## 2018-09-19 DIAGNOSIS — M5030 Other cervical disc degeneration, unspecified cervical region: Secondary | ICD-10-CM

## 2018-09-19 DIAGNOSIS — S43421D Sprain of right rotator cuff capsule, subsequent encounter: Secondary | ICD-10-CM | POA: Insufficient documentation

## 2018-09-19 DIAGNOSIS — G7111 Myotonic muscular dystrophy: Secondary | ICD-10-CM | POA: Diagnosis present

## 2018-09-19 MED ORDER — BELBUCA 150 MCG BU FILM: 150.0000 ug | ORAL_FILM | Freq: Two times a day (BID) | BUCCAL | 2 refills | Status: DC

## 2018-09-19 NOTE — Patient Instructions (Signed)
Three prescriptions for Belbuca have been sent to your pharmacy.

## 2018-09-19 NOTE — Progress Notes (Signed)
Patient's Name: Vickie Stevens  MRN: [id number]  Referring Provider: Jodi Marble, MD  DOB: 1970/12/14  PCP: Jodi Marble, MD  DOS: 09/19/2018  Note by: Gillis Santa, MD  Service setting: Ambulatory outpatient  Specialty: Interventional Pain Management  Location: [facility] (AMB) Pain Management Facility    Patient type: Established   Primary Reason(s) for Visit: Encounter for evaluation before starting new chronic pain management plan of care (Level of risk: moderate) CC: Shoulder Pain (left); Foot Pain (right); Hand Pain (right); and Neck Pain  HPI  Vickie Stevens is a 48 y.o. year old, female patient, who comes today for a follow-up evaluation to review the test results and decide on a treatment plan. 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 Her primarily concern today is the Shoulder Pain (left); Foot Pain (right); Hand Pain (right); and Neck Pain  Pain Assessment: Location: Left Shoulder Radiating: pain radiaities down left hip Onset: More than a month ago Duration: Chronic pain Quality: Constant, Aching, Stabbing, Burning Severity: 7 /10 (subjective, self-reported pain score)  Note: Reported level is inconsistent with clinical observations. Clinically the patient looks like a 3/10 A 3/10 is viewed as "Moderate" and described as significantly interfering with activities of daily living (ADL). It becomes difficult to feed, bathe,  get dressed, get on and off the toilet or to perform personal hygiene functions. Difficult to get in and out of bed or a chair without assistance. Very distracting. With effort, it can be ignored when deeply involved in activities. Information on the proper use of the pain scale provided to the patient today. When using our objective Pain Scale, levels between 6 and 10/10 are said to belong in an emergency room, as it progressively worsens from a 6/10, described as severely limiting, requiring emergency care not usually available at an outpatient pain management facility. At a 6/10 level, communication becomes difficult and requires great effort. Assistance to reach the emergency department may be required. Facial flushing and profuse sweating along with potentially dangerous increases in heart rate and blood pressure will be evident. Effect on ADL: limits my daily activities Timing: Constant Modifying factors: medications, TENS, masssge BP: 117/77  HR: 75  Vickie Stevens comes in today for a follow-up visit after her initial evaluation on 08/04/2018. Today we went over the results of her tests. These were explained in "Layman's terms". During today's appointment we went over my diagnostic impression, as well as the proposed treatment plan.  No changes in medical history.  UDS reviewed and appropriate.  Patient states that she will be leaving for [place] given the recent coronavirus care.  She does not have any family members here and states that she feels more comfortable in [place] where she knows more people.  In considering the treatment plan options, Vickie Stevens was reminded that I no longer take patients for medication management only. I asked her to let me know if she had no intention of taking advantage of the interventional therapies, so that we could make arrangements to provide this space to someone interested. I  also made it clear that undergoing interventional therapies for the purpose of getting  pain medications is very inappropriate on the part of a patient, and it will not be tolerated in this practice. This type of behavior would suggest true addiction and therefore it requires referral to an addiction specialist.   Further details on both, my assessment(s), as well as the proposed treatment plan, please see below.  Controlled Substance Pharmacotherapy Assessment REMS (Risk Evaluation and Mitigation Strategy)  Analgesic: Belbuca 150 mcg twice daily Pill Count: None expected due to no prior prescriptions written by our practice. Rise Patience, RN  09/19/2018  9:47 AM  Signed Safety precautions to be maintained throughout the outpatient stay will include: orient to surroundings, keep bed in low position, maintain call bell within reach at all times, provide assistance with transfer out of bed and ambulation.    Pharmacokinetics: Liberation and absorption (onset of action): WNL Distribution (time to peak effect): WNL Metabolism and excretion (duration of action): WNL         Pharmacodynamics: Desired effects: Analgesia: Vickie Stevens reports >50% benefit. Functional ability: Patient reports that medication allows her to accomplish basic ADLs Clinically meaningful improvement in function (CMIF): Sustained CMIF goals met Perceived effectiveness: Described as relatively effective, allowing for increase in activities of daily living (ADL) Undesirable effects: Side-effects or Adverse reactions: None reported Monitoring: [place] PMP: Online review of the past 21-monthperiod previously conducted. Not applicable at this point since we have not taken over the patient's medication management yet. List of other Serum/Urine Drug Screening Test(s):  No results found for: AMPHSCRSER, BARBSCRSER, BENZOSCRSER, COCAINSCRSER, COCAINSCRNUR, PCPSCRSER, THCSCRSER, THCU, CANNABQUANT, [place] [place] RockList of all UDS test(s) done:  Lab Results  Component Value Date   TOXASSSELUR  FINAL 07/10/2015   SUMMARY FINAL 08/04/2018   Last UDS on record: Tox[employer] Select 13  Date Value Ref Range Status  07/10/2015 FINAL  Final    Comment:    ==================================================================== TOXASSURE SELECT 13 (MW) ==================================================================== Test                             Result       Flag       Units Drug Present and Declared for Prescription Verification   7-aminoclonazepam              220          EXPECTED   ng/mg creat    7-aminoclonazepam is an expected metabolite of clonazepam. Source    of clonazepam is a scheduled prescription medication. Drug Present not Declared for Prescription Verification   Tapentadol                     >[phone]      UNEXPECTED ng/mg creat    Source of tapentadol is a scheduled prescription medication. Drug Absent but Declared for Prescription Verification   Tramadol                       Not Detected UNEXPECTED ==================================================================== Test                      Result    Flag   Units      Ref Range   Creatinine              61  mg/dL      >=20 ==================================================================== Declared Medications:  The flagging and interpretation on this report are based on the  following declared medications.  Unexpected results may arise from  inaccuracies in the declared medications.  **Note: The testing scope of this panel includes these medications:  Clonazepam  Clonazepam (Klonopin)  Tramadol  **Note: The testing scope of this panel does not include following  reported medications:  Pregabalin (Lyrica)  Quetiapine (Seroquel) ==================================================================== For clinical consultation, please call [phone]. ====================================================================    Summary  Date Value Ref Range Status  08/04/2018 FINAL  Final     Comment:    ==================================================================== TOXASSURE COMP DRUG ANALYSIS,UR ==================================================================== Test                             Result       Flag       Units Drug Present and Declared for Prescription Verification   Buprenorphine                  5            EXPECTED   ng/mg creat   Norbuprenorphine               12           EXPECTED   ng/mg creat    Source of buprenorphine is a scheduled prescription medication.    Norbuprenorphine is an expected metabolite of buprenorphine. Drug Present not Declared for Prescription Verification   Acetaminophen                  PRESENT      UNEXPECTED   Salicylate                     PRESENT      UNEXPECTED Drug Absent but Declared for Prescription Verification   Fluoxetine                     Not Detected UNEXPECTED   Quetiapine                     Not Detected UNEXPECTED ==================================================================== Test                      Result    Flag   Units      Ref Range   Creatinine              292              mg/dL      >=20 ==================================================================== Declared Medications:  The flagging and interpretation on this report are based on the  following declared medications.  Unexpected results may arise from  inaccuracies in the declared medications.  **Note: The testing scope of this panel includes these medications:  Buprenorphine (Belbuca)  Fluoxetine (Prozac)  Quetiapine (Seroquel)  **Note: The testing scope of this panel does not include following  reported medications:  Atorvastatin (Lipitor)  Chlorthalidone  Eye Drops  Linaclotide  Loratadine (Claritin)  Metformin  Montelukast  Naloxone (Narcan) ==================================================================== For clinical consultation, please call [phone]. ====================================================================    UDS interpretation: No unexpected findings.          Medication Assessment Form: Patient introduced to form today Treatment compliance: Treatment may start today if patient agrees with proposed plan. Evaluation of compliance is not applicable at this point Risk Assessment Profile:  Aberrant behavior: See initial evaluations. None observed or detected today Comorbid factors increasing risk of overdose: See initial evaluation. No additional risks detected today Opioid risk tool (ORT):  Opioid Risk  09/19/2018  Alcohol 0  Illegal Drugs 0  Rx Drugs 0  Alcohol 0  Illegal Drugs 0  Rx Drugs 0  Age between 16-45 years  0  History of Preadolescent Sexual Abuse 3  Psychological Disease 2  ADD Negative  OCD Negative  Bipolar Negative  Depression 0  Opioid Risk Tool Scoring 5  Opioid Risk Interpretation Moderate Risk    ORT Scoring interpretation table:  Score <3 = Low Risk for SUD  Score between 4-7 = Moderate Risk for SUD  Score >8 = High Risk for Opioid Abuse   Risk of substance use disorder (SUD): Low-to-Moderate  Risk Mitigation Strategies:  Patient opioid safety counseling: Completed today. Counseling provided to patient as per "Patient Counseling Document". Document signed by patient, attesting to counseling and understanding Patient-Prescriber Agreement (PPA): Obtained today.  Controlled substance notification to other providers: Written and sent today.  Pharmacologic Plan: Today we may be taking over the patient's pharmacological regimen. See below.             Laboratory Chemistry  Inflammation Markers (CRP: Acute Phase) (ESR: Chronic Phase) No results found for: CRP, ESRSEDRATE, LATICACIDVEN                       Rheumatology Markers No results found for: RF, ANA, LABURIC, URICUR, LYMEIGGIGMAB, LYMEABIGMQN, HLAB27                      Renal Function Markers Lab Results  Component Value  Date   BUN 22 (H) 12/08/2017   CREATININE 0.60 12/08/2017   GFRAA >60 12/08/2017   GFRNONAA >60 12/08/2017                             Hepatic Function Markers Lab Results  Component Value Date   AST 18 12/08/2017   ALT 14 12/08/2017   ALBUMIN 3.3 (L) 12/08/2017   ALKPHOS 75 12/08/2017                        Electrolytes Lab Results  Component Value Date   NA 139 12/08/2017   K 3.1 (L) 12/08/2017   CL 103 12/08/2017   CALCIUM 8.9 12/08/2017                        Neuropathy Markers No results found for: VITAMINB12, FOLATE, HGBA1C, HIV                      CNS Tests No results found for: COLORCSF, APPEARCSF, RBCCOUNTCSF, WBCCSF, POLYSCSF, LYMPHSCSF, EOSCSF, PROTEINCSF, GLUCCSF, JCVIRUS, CSFOLI, IGGCSF                      Bone Pathology Markers No results found for: VD25OH, [id number], [id number], BP1025EN2, 25OHVITD1, 25OHVITD2, 25OHVITD3, TESTOFREE, TESTOSTERONE                       Coagulation Parameters Lab Results  Component Value Date   INR 0.97 12/08/2017   LABPROT 12.8 12/08/2017   APTT 26 12/08/2017   PLT 325 12/08/2017  Cardiovascular Markers Lab Results  Component Value Date   TROPONINI < 0.02 09/07/2013   HGB 13.6 12/08/2017   HCT 40.0 12/08/2017                         CA Markers No results found for: CEA, CA125, LABCA2                      Endocrine Markers No results found for: TSH, FREET4, TESTOFREE, TESTOSTERONE, ESTRADIOL, ESTRADIOLPCT, ESTRADIOLFRE                      Note: Lab results reviewed.  Recent Diagnostic Imaging Review  Cervical Imaging:  Shoulder-R MR wo contrast:  Results for orders placed during the hospital encounter of 06/04/15  MR Shoulder Right Wo Contrast   Narrative CLINICAL DATA:  Chronic right shoulder pain. No recent injury. Initial encounter.  EXAM: MRI OF THE RIGHT SHOULDER WITHOUT CONTRAST  TECHNIQUE: Multiplanar, multisequence MR imaging of the shoulder was performed. No  intravenous contrast was administered.  COMPARISON:  None.  FINDINGS: Rotator cuff: Mild supraspinatus tendinopathy without tear is identified. The infraspinatus, subscapularis and teres minor are unremarkable.  Muscles:  No atrophy or focal lesion.  Biceps long head:  Intact.  Acromioclavicular Joint: Mild to moderate degenerative change is seen.  Glenohumeral Joint: Unremarkable.  Labrum:  Intact.  Bones:  The acromion is type 1.  IMPRESSION: Mild supraspinatus tendinopathy without tear.  Mild to moderate acromioclavicular osteoarthritis.   Electronically Signed   By: Inge Rise M.D.   On: 06/04/2015 15:35      Complexity Note: Imaging results reviewed. Results shared with Vickie Stevens, using Layman's terms.                         Meds   Current Outpatient Medications:  .  atorvastatin (LIPITOR) 20 MG tablet, Take 20 mg by mouth daily., Disp: , Rfl: 0 .  BELBUCA 150 MCG FILM, Place 150 mcg inside cheek 2 (two) times daily., Disp: 60 each, Rfl: 2 .  chlorthalidone (HYGROTON) 25 MG tablet, Take 25 mg by mouth daily., Disp: , Rfl:  .  Dextran 70-Hypromellose, PF, (ARTIFICIAL TEARS PF) 0.1-0.3 % SOLN, Place 1-2 drops into both eyes daily as needed (for dryness or irritation)., Disp: , Rfl:  .  FLUoxetine (PROZAC) 40 MG capsule, Take 40 mg by mouth daily., Disp: , Rfl: 0 .  linaCLOtide (LINZESS PO), Take by mouth., Disp: , Rfl:  .  loratadine (CLARITIN) 10 MG tablet, Take 10 mg by mouth daily. , Disp: , Rfl:  .  metFORMIN (GLUCOPHAGE-XR) 500 MG 24 hr tablet, Take 500 mg by mouth daily with breakfast., Disp: , Rfl:  .  montelukast (SINGULAIR) 10 MG tablet, Take 10 mg by mouth at bedtime., Disp: , Rfl:  .  NARCAN 4 MG/0.1ML LIQD nasal spray kit, Place 4 mg into the nose once as needed (for opioid crisis). , Disp: , Rfl: 0 .  QUEtiapine Fumarate (SEROQUEL XR) 150 MG 24 hr tablet, Take 150 mg by mouth at bedtime., Disp: , Rfl:   Current Facility-Administered  Medications:  .  bupivacaine (MARCAINE) 0.5 % injection 30 mL, 30 mL, Other, Once, Mohammed Kindle, MD .  bupivacaine (PF) (MARCAINE) 0.25 % injection 30 mL, 30 mL, Other, Once, Mohammed Kindle, MD .  lactated ringers infusion 1,000 mL, 1,000 mL, Intravenous, Continuous, Mohammed Kindle, MD .  orphenadrine (  NORFLEX) injection 60 mg, 60 mg, Intramuscular, Once, Mohammed Kindle, MD .  triamcinolone acetonide (KENALOG-40) injection 40 mg, 40 mg, Other, Once, Mohammed Kindle, MD  ROS  Constitutional: Denies any fever or chills Gastrointestinal: No reported hemesis, hematochezia, vomiting, or acute GI distress Musculoskeletal: Denies any acute onset joint swelling, redness, loss of ROM, or weakness Neurological: No reported episodes of acute onset apraxia, aphasia, dysarthria, agnosia, amnesia, paralysis, loss of coordination, or loss of consciousness  Allergies  Vickie Stevens is allergic to phenergan [promethazine hcl]; codeine; cortisone; lidocaine; and oxycodone.  [place]  Drug: Vickie Stevens  reports no history of drug use. Alcohol:  reports no history of alcohol use. Tobacco:  reports that she has never smoked. She has never used smokeless tobacco. 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 Surgical: Vickie Stevens  has a past surgical history that includes Endometrial ablation (01-2011); Shoulder surgery (Left, 07-1999, 07-2011); Cataract extraction, bilateral; Esophagogastroduodenoscopy (egd) with propofol (N/A, 06/08/2018); and Colonoscopy with propofol (N/A, 06/08/2018). Family: family history includes Diabetes in her father;  Hyperlipidemia in her father; Hypertension in her father.  Constitutional Exam  General appearance: Well nourished, well developed, and well hydrated. In no apparent acute distress Vitals:   09/19/18 0905  BP: 117/77  Pulse: 75  Temp: 98.5 F (36.9 C)  SpO2: 100%  Weight: 227 lb (103 kg)  Height: 5' 9"  (1.753 m)   BMI Assessment: Estimated body mass index is 33.52 kg/m as calculated from the following:   Height as of this encounter: 5' 9"  (1.753 m).   Weight as of this encounter: 227 lb (103 kg).  BMI interpretation table: BMI level Category Range association with higher incidence of chronic pain  <18 kg/m2 Underweight   18.5-24.9 kg/m2 Ideal body weight   25-29.9 kg/m2 Overweight Increased incidence by 20%  30-34.9 kg/m2 Obese (Class I) Increased incidence by 68%  35-39.9 kg/m2 Severe obesity (Class II) Increased incidence by 136%  >40 kg/m2 Extreme obesity (Class III) Increased incidence by 254%   Patient's current BMI Ideal Body weight  Body mass index is 33.52 kg/m. Ideal body weight: 66.2 kg (145 lb 15.1 oz) Adjusted ideal body weight: 80.9 kg (178 lb 5.9 oz)   BMI Readings from Last 4 Encounters:  09/19/18 33.52 kg/m  08/04/18 33.58 kg/m  06/08/18 33.86 kg/m  12/08/17 34.09 kg/m   Wt Readings from Last 4 Encounters:  09/19/18 227 lb (103 kg)  08/04/18 227 lb 6.4 oz (103.1 kg)  06/08/18 229 lb 4.5 oz (104 kg)  12/08/17 230 lb 13.2 oz (104.7 kg)  Psych/Mental status: Alert, oriented x 3 (person, place, & time)       Eyes: PERLA Respiratory: No evidence of acute respiratory distress  Cervical Spine Area Exam  Skin & Axial Inspection: No masses, redness, edema, swelling, or associated skin lesions Alignment: Symmetrical Functional ROM: Unrestricted ROM      Stability: No instability detected Muscle Tone/Strength: Functionally intact. No obvious neuro-muscular anomalies detected. Sensory (Neurological): Unimpaired Palpation: No palpable anomalies               Upper Extremity (UE) Exam    Side: Right upper extremity  Side: Left upper extremity  Skin & Extremity Inspection: Skin color, temperature, and hair growth  are WNL. No peripheral edema or cyanosis. No masses, redness, swelling, asymmetry, or associated skin lesions. No contractures.  Skin & Extremity Inspection: Skin color, temperature, and hair growth are WNL. No peripheral edema or cyanosis. No masses, redness, swelling, asymmetry, or associated skin lesions. No contractures.  Functional ROM: Unrestricted ROM          Functional ROM: Pain restricted ROM          Muscle Tone/Strength: Functionally intact. No obvious neuro-muscular anomalies detected.  Muscle Tone/Strength: Functionally intact. No obvious neuro-muscular anomalies detected.  Sensory (Neurological): Unimpaired          Sensory (Neurological): Musculoskeletal pain pattern and neuropathic          Palpation: No palpable anomalies              Palpation: No palpable anomalies              Provocative Test(s):  Phalen's test: deferred Tinel's test: deferred Apley's scratch test (touch opposite shoulder):  Action 1 (Across chest): deferred Action 2 (Overhead): deferred Action 3 (LB reach): deferred   Provocative Test(s):  Phalen's test: deferred Tinel's test: deferred Apley's scratch test (touch opposite shoulder):  Action 1 (Across chest): Decreased ROM Action 2 (Overhead): Decreased ROM Action 3 (LB reach): Decreased ROM    Thoracic Spine Area Exam  Skin & Axial Inspection: No masses, redness, or swelling Alignment: Symmetrical Functional ROM: Diminished ROM Stability: No instability detected Muscle Tone/Strength: Functionally intact. No obvious neuro-muscular anomalies detected. Sensory (Neurological): Unimpaired Muscle strength & Tone: No palpable anomalies  Lumbar Spine Area Exam  Skin & Axial Inspection: No masses, redness, or swelling Alignment: Symmetrical Functional ROM: Unrestricted ROM       Stability: No  instability detected Muscle Tone/Strength: Functionally intact. No obvious neuro-muscular anomalies detected. Sensory (Neurological): Unimpaired Palpation: No palpable anomalies       Provocative Tests: Hyperextension/rotation test: deferred today       Lumbar quadrant test (Kemp's test): deferred today       Lateral bending test: deferred today       Patrick's Maneuver: deferred today                   FABER* test: deferred today                   S-I anterior distraction/compression test: deferred today         S-I lateral compression test: deferred today         S-I Thigh-thrust test: deferred today         S-I Gaenslen's test: deferred today         *(Flexion, ABduction and External Rotation)  Gait & Posture Assessment  Ambulation: Unassisted Gait: Relatively normal for age and body habitus Posture: WNL   Lower Extremity Exam    Side: Right lower extremity  Side: Left lower extremity  Stability: No instability observed          Stability: No instability observed          Skin & Extremity Inspection: Skin color, temperature, and hair growth are WNL. No peripheral edema or cyanosis. No masses, redness, swelling, asymmetry, or associated skin lesions. No contractures.  Skin & Extremity Inspection: Skin color, temperature, and hair growth are WNL. No peripheral edema or cyanosis. No masses, redness, swelling, asymmetry, or associated skin lesions. No contractures.  Functional ROM: Unrestricted ROM  Functional ROM: Unrestricted ROM                  Muscle Tone/Strength: Functionally intact. No obvious neuro-muscular anomalies detected.  Muscle Tone/Strength: Functionally intact. No obvious neuro-muscular anomalies detected.  Sensory (Neurological): Unimpaired        Sensory (Neurological): Unimpaired        DTR: Patellar: deferred today Achilles: deferred today Plantar: deferred today  DTR: Patellar: deferred today Achilles: deferred today Plantar: deferred today   Palpation: No palpable anomalies  Palpation: No palpable anomalies   Assessment & Plan  Primary Diagnosis & Pertinent Problem List: The primary encounter diagnosis was Chronic pain syndrome. 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  Visit Diagnosis: 1. Chronic pain syndrome   2. 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 Myotonic dystrophy ([place])   4. Cervical neuritis   5. DDD (degenerative disc disease), cervical   6. Sprain of right rotator cuff capsule, subsequent encounter   7. Lumbar and sacral arthritis   8. Post-traumatic osteoarthritis of both shoulders   9. Controlled substance agreement signed    Problems updated and reviewed during this visit: Problem  Chronic Pain Syndrome  Post-Traumatic Osteoarthritis of Both Shoulders  Controlled Substance Agreement Signed   Chronic pain secondary to myotonic muscular dystrophy.  Previously seen Dr. Primus Bravo.  However due to insurance change, is interested in transferring her care here.  Has been managed on belbuca 150 mcg twice daily.  Patient has tried lumbar facet medial branch nerve blocks in the past which she states were effective however she was told that the steroids resulted in cataracts.  Patient is also tried Nucynta 50 mg daily in the past which he states was very helpful but insurance will not cover this.  Patient had a cervical MRI done which was unremarkable for any cervical pathology including neuroforaminal or spinal canal stenosis.  Patient will sign opioid contract.  Prescription for belbuca as below.  Risk and benefits of this medication reviewed.  Plan of Care  Pharmacotherapy (Medications Ordered): Meds ordered this  encounter  Medications  . BELBUCA 150 MCG FILM    Sig: Place 150 mcg inside cheek 2 (two) times daily.    Dispense:  60 each    Refill:  2   Pharmacological management options:  Opioid Analgesics: We'll take over management today. See above orders Membrane stabilizer: We have discussed the possibility of optimizing this mode of therapy, if tolerated Muscle relaxant: We have discussed the possibility of a trial NSAID: We have discussed the possibility of a trial Other analgesic(s): To be determined at a later time   Interventional management options:  Considering:   Lumbar facet medial branch nerve blocks   Time Note: Greater than 50% of the 25 minute(s) of face-to-face time spent with Vickie Stevens, was spent in counseling/coordination of care regarding: the appropriate use of the pain scale, Vickie Stevens primary cause of pain, the treatment plan, treatment alternatives, medication side effects, the opioid analgesic risks and possible complications, the appropriate use of her medications, realistic expectations, the goals of pain management (increased in functionality), the medication agreement and the patient's responsibilities when it comes to controlled substances.  Provider-requested follow-up: Return in about 3 months (around 12/20/2018) for Medication Management.  Future Appointments  Date Time  Provider [place]  11/09/2018 10:40 AM [facility]  12/15/2018 10:45 AM Gillis Santa, MD [facility] None    Primary Care Physician: Jodi Marble, MD Location: [facility] Note by: Gillis Santa, M.D Date: 09/19/2018; Time: 10:33 AM  Patient Instructions  Three prescriptions for Belbuca have been sent to your pharmacy.

## 2018-09-19 NOTE — Telephone Encounter (Signed)
Pt called and stated that pharmacy would not fill the prescription of Belbuca 111mg because it was too soon, then pt stated that pharmacy said it looks like the prescription was sent to two different pharmacies.

## 2018-09-19 NOTE — Telephone Encounter (Signed)
Spoke with pharmacy, it is unclear why script cannot be filled, because only one script was sent today. Not possible to have beens sent to different pharmacies since there is only one script. Also she has not previously filled a script for Belbuca in the past 30 days, so she should be able to fill it today. Pharmacist will over ride and go ahead and fill it. Patient notified.

## 2018-09-19 NOTE — Progress Notes (Signed)
Safety precautions to be maintained throughout the outpatient stay will include: orient to surroundings, keep bed in low position, maintain call bell within reach at all times, provide assistance with transfer out of bed and ambulation.  

## 2018-12-15 ENCOUNTER — Encounter: Payer: Self-pay | Admitting: Student in an Organized Health Care Education/Training Program

## 2018-12-15 ENCOUNTER — Other Ambulatory Visit: Payer: Self-pay

## 2018-12-15 ENCOUNTER — Ambulatory Visit
Payer: Medicare PPO | Attending: Student in an Organized Health Care Education/Training Program | Admitting: Student in an Organized Health Care Education/Training Program

## 2018-12-15 DIAGNOSIS — M19111 Post-traumatic osteoarthritis, right shoulder: Secondary | ICD-10-CM

## 2018-12-15 DIAGNOSIS — M5412 Radiculopathy, cervical region: Secondary | ICD-10-CM

## 2018-12-15 DIAGNOSIS — M5030 Other cervical disc degeneration, unspecified cervical region: Secondary | ICD-10-CM | POA: Diagnosis not present

## 2018-12-15 DIAGNOSIS — E7402 Pompe disease: Secondary | ICD-10-CM | POA: Diagnosis not present

## 2018-12-15 DIAGNOSIS — M5481 Occipital neuralgia: Secondary | ICD-10-CM

## 2018-12-15 DIAGNOSIS — G894 Chronic pain syndrome: Secondary | ICD-10-CM

## 2018-12-15 DIAGNOSIS — M47817 Spondylosis without myelopathy or radiculopathy, lumbosacral region: Secondary | ICD-10-CM

## 2018-12-15 DIAGNOSIS — M19112 Post-traumatic osteoarthritis, left shoulder: Secondary | ICD-10-CM

## 2018-12-15 MED ORDER — BELBUCA 150 MCG BU FILM: 150.0000 ug | ORAL_FILM | Freq: Two times a day (BID) | BUCCAL | 2 refills | Status: DC

## 2018-12-15 NOTE — Progress Notes (Signed)
Pain Management Virtual Encounter Note - Virtual Visit via [place] (real-time audio visits between healthcare provider and patient).   Patient's Phone No. & Preferred Pharmacy:  [phone] (home); [phone] (mobile); (Preferred) [phone] [email or web address]  [facility], [place] [address] Phone: [phone] Fax: [phone]  [facility] [place] [place] [facility] [place] [address] [place] [address] Phone: [phone] Fax: [phone]    Pre-screening note:  Our staff contacted Ms. Goins and offered her an "in person", "face-to-face" appointment versus a telephone encounter. She indicated preferring the telephone encounter, at this time.   Reason for Virtual Visit: COVID-19*  Social distancing based on CDC and AMA recommendations.   I contacted Deveron Furlong on 12/15/2018 via video conference.      I clearly identified myself as Gillis Santa, MD. I verified that I was speaking with the correct person using two identifiers (Name: CHIVON LEPAGE, and date of birth: [birth year]).  Advanced Informed Consent I sought verbal advanced consent from Deveron Furlong for virtual visit interactions. I informed Ms. Simms of possible security and privacy concerns, risks, and limitations associated with providing "not-in-person" medical evaluation and management services. I also informed Ms. Sawdey of the availability of "in-person" appointments. Finally, I informed her that there would be a charge for the virtual visit and that she could be  personally, fully or partially, financially responsible for it. Ms. Wiest expressed understanding and agreed to proceed.   Historic Elements   [name] is a 48 y.o. year old, female patient evaluated today after her last encounter by our practice on 09/19/2018. Ms. 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 She also  has a past surgical history that includes Endometrial ablation (01-2011); Shoulder surgery (Left, 07-1999, 07-2011); Cataract extraction, bilateral; Esophagogastroduodenoscopy (egd) with propofol (N/A, 06/08/2018); and Colonoscopy with propofol (N/A, 06/08/2018). Ms. Rouser has a current medication list which includes the following prescription(s): atorvastatin, belbuca, dextran 70-hypromellose (pf), lisinopril, loratadine, metformin, montelukast, narcan, chlorthalidone, fluoxetine, linaclotide, and quetiapine fumarate, and the following Facility-Administered Medications: bupivacaine, bupivacaine (pf), lactated ringers, orphenadrine, and triamcinolone acetonide. She  reports that she has never smoked. She has never used smokeless tobacco. She reports that she does not drink alcohol or use drugs. Ms. Weitzman is allergic to phenergan [promethazine hcl]; codeine; cortisone; lidocaine; and oxycodone.   HPI  Today, she is being contacted for medication management.   No change in medical history since last visit.  Patient's pain is at baseline.  Patient continues multimodal pain regimen as prescribed.  States that it provides pain relief and improvement in functional status.  Pharmacotherapy Assessment   09/20/2018  3   09/19/2018  Belbuca 150 MCG Film  60.00 30 Bi Lat   [id number]   Nor (0290)   0  0.30 mg  Medicare   [place]     Monitoring: Pharmacotherapy: No side-effects or adverse reactions reported. [place] PMP: PDMP reviewed during this encounter.       Compliance: No problems identified. Effectiveness: Clinically  acceptable. Plan: Refer to "POC".  Pertinent Labs   SAFETY SCREENING Profile Lab Results  Component Value Date  PREGTESTUR NEGATIVE 06/08/2018   Renal Function Lab Results  Component Value Date   BUN 22 (H) 12/08/2017   CREATININE 0.60 12/08/2017   GFRAA >60 12/08/2017   GFRNONAA >60 12/08/2017   Hepatic Function Lab Results  Component Value Date   AST 18 12/08/2017   ALT 14 12/08/2017   ALBUMIN 3.3 (L) 12/08/2017   UDS Summary  Date Value Ref Range Status  08/04/2018 FINAL  Final    Comment:    ==================================================================== TOXASSURE COMP DRUG ANALYSIS,UR ==================================================================== Test                             Result       Flag       Units Drug Present and Declared for Prescription Verification   Buprenorphine                  5            EXPECTED   ng/mg creat   Norbuprenorphine               12           EXPECTED   ng/mg creat    Source of buprenorphine is a scheduled prescription medication.    Norbuprenorphine is an expected metabolite of buprenorphine. Drug Present not Declared for Prescription Verification   Acetaminophen                  PRESENT      UNEXPECTED   Salicylate                     PRESENT      UNEXPECTED Drug Absent but Declared for Prescription Verification   Fluoxetine                     Not Detected UNEXPECTED   Quetiapine                     Not Detected UNEXPECTED ==================================================================== Test                      Result    Flag   Units      Ref Range   Creatinine              292              mg/dL      >=20 ==================================================================== Declared Medications:  The flagging and interpretation on this report are based on the  following declared medications.  Unexpected results may arise from  inaccuracies in the declared medications.  **Note: The testing scope of this  panel includes these medications:  Buprenorphine (Belbuca)  Fluoxetine (Prozac)  Quetiapine (Seroquel)  **Note: The testing scope of this panel does not include following  reported medications:  Atorvastatin (Lipitor)  Chlorthalidone  Eye Drops  Linaclotide  Loratadine (Claritin)  Metformin  Montelukast  Naloxone (Narcan) ==================================================================== For clinical consultation, please call [phone]. ====================================================================    Note: Above Lab results reviewed.  Recent imaging  CT HEAD CODE STROKE WO CONTRAST Addendum: ADDENDUM REPORT: 12/09/2017 13:26   ADDENDUM:  Critical Value/emergent results text paged to [place], Neurology  via AMION secure system on 12/08/2017 at 8:44 pm, including  interpreting physician's phone number. NOT to [employer].   Electronically Signed    By: Elon Alas M.D.    On: 12/09/2017 13:26 Narrative: CLINICAL DATA:  Code stroke. Facial droop and weakness. History of cluster headache, hypertension, diabetes, tuberous sclerosis.  EXAM: CT HEAD WITHOUT CONTRAST  TECHNIQUE: Contiguous axial images were obtained from the base of the skull through the vertex without intravenous contrast.  COMPARISON:  None.  FINDINGS: BRAIN: No intraparenchymal hemorrhage, mass effect nor midline shift. The ventricles and sulci are normal. No acute large vascular territory infarcts. Subcentimeter calcification right cerebellum, nonspecific for tuber. No abnormal extra-axial fluid collections. Basal cisterns are patent.  VASCULAR: Unremarkable.  SKULL/SOFT TISSUES: No skull fracture. No significant soft tissue swelling.  ORBITS/SINUSES: The included ocular globes and orbital contents are normal.Mild paranasal sinus mucosal thickening. Status post bilateral ocular lens implants.  OTHER: None.  ASPECTS [facility] Stroke Program Early CT Score)  - Ganglionic  level infarction (caudate, lentiform nuclei, internal capsule, insula, M1-M3 cortex): 7  - Supraganglionic infarction (M4-M6 cortex): 3  Total score (0-10 with 10 being normal): 10  IMPRESSION: 1. Negative noncontrast CT head. 2. ASPECTS is 10. 3. Critical Value/emergent results text paged to Dr.SCOTT GOLDSTON via AMION secure system on 12/08/2017 at 8:44 pm, including interpreting physician's phone number.  Electronically Signed: By: Elon Alas M.D. On: 12/08/2017 20:46  Assessment  The primary encounter diagnosis was Chronic pain syndrome. 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  Plan of Care  I am having Deveron Furlong "Hano" maintain her atorvastatin, chlorthalidone, FLUoxetine, Narcan, metFORMIN, QUEtiapine Fumarate, loratadine, Dextran 70-Hypromellose (PF), montelukast, linaCLOtide (LINZESS PO), lisinopril, and Belbuca. We will continue to administer lactated ringers, bupivacaine, bupivacaine (PF), orphenadrine, and triamcinolone acetonide.  Pharmacotherapy (Medications Ordered): Meds ordered this encounter  Medications  . BELBUCA 150 MCG FILM    Sig: Place 150 mcg inside cheek 2 (two) times daily.    Dispense:  60 each    Refill:  2   Orders:  No orders of the defined types were placed in this encounter.  Follow-up plan:   56month MM   I discussed the assessment and treatment plan with the patient. The patient was provided an opportunity to ask questions and all were answered. The patient agreed with the plan and demonstrated an understanding of the instructions.  Patient advised to call back or seek an in-person evaluation if the symptoms or condition worsens.  Total duration of non-face-to-face encounter: 25 minutes.  Note by: BGillis Santa MD Date:  12/15/2018; Time: 12:00 PM  Note: This dictation was prepared with Dragon dictation. Any transcriptional errors that may result from this process are unintentional.  Disclaimer:  * Given the special circumstances of the COVID-19 pandemic, the federal government has announced that the Office for Civil Rights (OCR) will exercise its enforcement discretion and will not impose penalties on physicians using telehealth in the event of noncompliance with regulatory requirements under the [place](HIPAA) in connection with the good faith provision of telehealth during the CMNOTR-71national public health emergency. (AS[place]

## 2018-12-19 ENCOUNTER — Telehealth: Payer: Self-pay

## 2018-12-19 NOTE — Telephone Encounter (Signed)
[facility] [place] [facility]. The phone number is [phone] [facility] [place]. Or you can call it in. She cannot pick it up in [place], it has to be in [place].

## 2018-12-20 NOTE — Telephone Encounter (Signed)
Spoke with pharmacist at [facility] in [place] ans she states she would be able to fill Belbuca if it was sent by an MD.  The [facility] is [address] [place].  Can you do this please?  Thank you

## 2018-12-21 ENCOUNTER — Other Ambulatory Visit: Payer: Self-pay | Admitting: Student in an Organized Health Care Education/Training Program

## 2018-12-21 MED ORDER — BELBUCA 150 MCG BU FILM: 150.0000 ug | ORAL_FILM | Freq: Two times a day (BID) | BUCCAL | 2 refills | Status: DC

## 2018-12-21 NOTE — Telephone Encounter (Signed)
[facility] [place] [facility] prescription.

## 2019-02-14 ENCOUNTER — Telehealth: Payer: Self-pay | Admitting: Student in an Organized Health Care Education/Training Program

## 2019-02-14 NOTE — Telephone Encounter (Signed)
Patient states pharmacy told her they need PA for Belbuca, from [facility].

## 2019-02-16 NOTE — Telephone Encounter (Signed)
It was sent on 08/11. Patient called and made aware.

## 2019-03-15 ENCOUNTER — Encounter: Payer: Self-pay | Admitting: Student in an Organized Health Care Education/Training Program

## 2019-03-16 ENCOUNTER — Other Ambulatory Visit: Payer: Self-pay

## 2019-03-16 ENCOUNTER — Encounter: Payer: Self-pay | Admitting: Student in an Organized Health Care Education/Training Program

## 2019-03-16 ENCOUNTER — Ambulatory Visit
Payer: Medicare PPO | Attending: Student in an Organized Health Care Education/Training Program | Admitting: Student in an Organized Health Care Education/Training Program

## 2019-03-16 DIAGNOSIS — M5030 Other cervical disc degeneration, unspecified cervical region: Secondary | ICD-10-CM

## 2019-03-16 DIAGNOSIS — M5481 Occipital neuralgia: Secondary | ICD-10-CM

## 2019-03-16 DIAGNOSIS — G894 Chronic pain syndrome: Secondary | ICD-10-CM

## 2019-03-16 DIAGNOSIS — Z79899 Other long term (current) drug therapy: Secondary | ICD-10-CM

## 2019-03-16 DIAGNOSIS — M19112 Post-traumatic osteoarthritis, left shoulder: Secondary | ICD-10-CM

## 2019-03-16 DIAGNOSIS — M47817 Spondylosis without myelopathy or radiculopathy, lumbosacral region: Secondary | ICD-10-CM | POA: Diagnosis not present

## 2019-03-16 DIAGNOSIS — M5412 Radiculopathy, cervical region: Secondary | ICD-10-CM | POA: Diagnosis not present

## 2019-03-16 DIAGNOSIS — S43421D Sprain of right rotator cuff capsule, subsequent encounter: Secondary | ICD-10-CM

## 2019-03-16 DIAGNOSIS — G7111 Myotonic muscular dystrophy: Secondary | ICD-10-CM

## 2019-03-16 DIAGNOSIS — M19111 Post-traumatic osteoarthritis, right shoulder: Secondary | ICD-10-CM

## 2019-03-16 MED ORDER — BELBUCA 150 MCG BU FILM: 150.0000 ug | ORAL_FILM | Freq: Two times a day (BID) | BUCCAL | 2 refills | Status: AC

## 2019-03-16 NOTE — Progress Notes (Signed)
Pain Management Virtual Encounter Note - Virtual Visit via [place] (real-time audio visits between healthcare provider and patient).   Patient's Phone No. & Preferred Pharmacy:  [phone] (home); [phone] (mobile); (Preferred) [phone] [email or web address]  [facility], [place] [address] Phone: [phone] Fax: [phone]  [facility] [place] [place] [address] Phone: [phone] Fax: [phone]  [id number] [place] [address] [place] [address]: [phone]: [phone] [id number] [place] [id number]: [phone]: [phone]  Pre-screening note:  Our staff contacted Vickie Stevens offered her an "in person", "face-to-face" appointment versus a telephone encounter. She indicated preferring the telephone encounter, at this time.   Reason for Virtual Visit: COVID-19*  Social distancing based on CDC and AMA recommendations.   I contacted Vickie Furlongn 03/16/2019 via video conference.      I clearly identified myself as BiGillis Stevens. I verified that I was speaking with the correct person using two identifiers (Name: Vickie PINARDand date of birth: [birth year]  Advanced Informed Consent I sought verbal advanced consent from Vickie Furlongor virtual visit interactions. I informed Vickie Stevens possible security and privacy concerns, risks, and limitations associated with providing "not-in-person" medical evaluation and management services. I also informed Vickie Stevens the availability of "in-person" appointments. Finally, I informed her that there would be a charge for the virtual visit and that she could be  personally, fully or partially, financially  responsible for it. Ms. KhAlviarxpressed understanding and agreed to proceed.   Historic Elements   Vickie Stevens a [age].o. year old, female patient evaluated today after her last encounter by our practice on 02/14/2019. Vickie Stevens a past medical history of ADHD (attention deficit hyperactivity disorder), Biceps tendonitis, Bipolar disorder (HCKent Chronic back pain, Cluster headache, Crohn's disease (H[place] DDD (degenerative disc disease), lumbar, Depression, Diabetes mellitus without complication (HC[place] Hypercholesteremia, Hypertension, PCOS (polycystic ovarian syndrome), Rotator cuff tendinitis, Sleep apnea, and Tuberous sclerosis (HCCity of [place] She also  has a past surgical history that includes Endometrial ablation (01-2011); Shoulder surgery (Left, 07-1999, 07-2011); Cataract extraction, bilateral; Esophagogastroduodenoscopy (egd) with propofol (N/A, 06/08/2018); and Colonoscopy with propofol (N/A, 06/08/2018). [name] a current medication list which includes the following prescription(s): atorvastatin, belbuca, chlorthalidone, dextran 70-hypromellose (pf), fluoxetine, lisinopril, loratadine, metformin, montelukast, narcan, quetiapine fumarate, and linaclotide, and the following Facility-Administered Medications: bupivacaine, bupivacaine (pf), lactated ringers, orphenadrine, and triamcinolone acetonide. She  reports that she has never smoked. She has never used smokeless tobacco. She reports that she does not drink alcohol or use drugs. Vickie Stevens allergic to phenergan [promethazine hcl]; codeine; cortisone; lidocaine; and oxycodone.   HPI  Today, she is being contacted for medication management.   No change in medical history since last visit.  Patient's pain is at baseline.  Patient continues multimodal pain regimen as prescribed.  States that it provides pain relief and improvement in functional status.  She states that belbuca does help manage her pain although she sometimes avoids  taking a daytime dose as it does make her sleepy.  Patient is scheduled to [facility] a geneticist [facility] Differential of Pompe disease is being further explored and  she is supposed to have an echocardiogram and PFTs performed.  She states that she is in the process of trying to arrange a consultation with a pulmonologist.  Refill medications as below.  Pharmacotherapy Assessment  Analgesic:  09/20/2018  2   09/19/2018  Belbuca 150 MCG Film  60.00  30 Bi Lat   [id number]   Nor (0290)   0  0.30 mg  Medicare   [place]    Monitoring: Pharmacotherapy: No side-effects or adverse reactions reported.  PMP: PDMP reviewed during this encounter.       Compliance: No problems identified. Effectiveness: Clinically acceptable. Plan: Refer to "POC".  UDS:  Summary  Date Value Ref Range Status  08/04/2018 FINAL  Final    Comment:    ==================================================================== TOXASSURE COMP DRUG ANALYSIS,UR ==================================================================== Test                             Result       Flag       Units Drug Present and Declared for Prescription Verification   Buprenorphine                  5            EXPECTED   ng/mg creat   Norbuprenorphine               12           EXPECTED   ng/mg creat    Source of buprenorphine is a scheduled prescription medication.    Norbuprenorphine is an expected metabolite of buprenorphine. Drug Present not Declared for Prescription Verification   Acetaminophen                  PRESENT      UNEXPECTED   Salicylate                     PRESENT      UNEXPECTED Drug Absent but Declared for Prescription Verification   Fluoxetine                     Not Detected UNEXPECTED   Quetiapine                     Not Detected UNEXPECTED ==================================================================== Test                      Result    Flag   Units      Ref Range   Creatinine              292               mg/dL      >=20 ==================================================================== Declared Medications:  The flagging and interpretation on this report are based on the  following declared medications.  Unexpected results may arise from  inaccuracies in the declared medications.  **Note: The testing scope of this panel includes these medications:  Buprenorphine (Belbuca)  Fluoxetine (Prozac)  Quetiapine (Seroquel)  **Note: The testing scope of this panel does not include following  reported medications:  Atorvastatin (Lipitor)  Chlorthalidone  Eye Drops  Linaclotide  Loratadine (Claritin)  Metformin  Montelukast  Naloxone (Narcan) ==================================================================== For clinical consultation, please call [phone]. ====================================================================    Laboratory Chemistry Profile (12 mo)  Renal: No results found for requested labs within last 8760 hours.  Lab Results  Component Value Date  GFRAA >60 12/08/2017   GFRNONAA >60 12/08/2017   Hepatic: No results found for requested labs within last 8760 hours. Lab Results  Component Value Date   AST 18 12/08/2017   ALT 14 12/08/2017   Other: No results found for requested labs within last 8760 hours. Note: Above Lab results reviewed.   Assessment  The primary encounter diagnosis was Chronic pain syndrome. 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  Plan of Care  I am having Deveron Stevens "Hano" maintain her atorvastatin, chlorthalidone, FLUoxetine, Narcan, metFORMIN, QUEtiapine Fumarate, loratadine, Dextran  70-Hypromellose (PF), montelukast, linaCLOtide (LINZESS PO), lisinopril, and Belbuca. We will continue to administer lactated ringers, bupivacaine, bupivacaine (PF), orphenadrine, and triamcinolone acetonide.  Pharmacotherapy (Medications Ordered): Meds ordered this encounter  Medications  . BELBUCA 150 MCG FILM    Sig: Place 150 mcg inside cheek 2 (two) times daily.    Dispense:  60 each    Refill:  2    Follow-up plan:   Return in about 4 months (around 07/16/2019) for Medication Management, in person (need UDS).    Recent Visits No visits were found meeting these conditions.  Showing recent visits within past 90 days and meeting all other requirements   Today's Visits Date Type Provider Dept  03/16/19 Office Visit Vickie Santa, MD [facility]  Showing today's visits and meeting all other requirements   Future Appointments No visits were found meeting these conditions.  Showing future appointments within next 90 days and meeting all other requirements   I discussed the assessment and treatment plan with the patient. The patient was provided an opportunity to ask questions and all were answered. The patient agreed with the plan and demonstrated an understanding of the instructions.  Patient advised to call back or seek an in-person evaluation if the symptoms or condition worsens.  Total duration of non-face-to-face encounter:66mnutes.  Note by: BGillis Santa MD Date: 03/16/2019; Time: 12:22 PM  Note: This dictation was prepared with Dragon dictation. Any transcriptional errors that may result from this process are unintentional.  Disclaimer:  * Given the special circumstances of the COVID-19 pandemic, the federal government has announced that the Office for Civil Rights (OCR) will exercise its enforcement discretion and will not impose penalties on physicians using telehealth in the event of noncompliance with regulatory requirements under the [place](HIPAA) in connection with the good faith provision of telehealth during the CKBTCY-81national public health emergency. ([place]

## 2019-04-24 IMAGING — MR MR HEAD WO/W CM
11 of 13 series · 38 of 48 positions shown · IV contrast (multihance)
Comparison: Head CT 12/08/2017

CLINICAL DATA: Facial droop and slurred speech.

EXAM:
MRI HEAD WITHOUT AND WITH CONTRAST
TECHNIQUE: Multiplanar, multiecho pulse sequences of the brain and surrounding
structures were obtained without and with intravenous contrast.
CONTRAST:  20mL MULTIHANCE GADOBENATE DIMEGLUMINE 529 MG/ML IV SOLN

[Series 5: ax dwi_tracew · axial · 3.0mm · 1.50mm/px · z∈[-63,+77]mm · 8 of 80 slices shown]
[im 1/80]
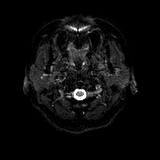
[im 12/80]
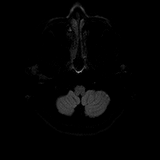
[im 23/80]
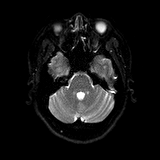
[im 34/80]
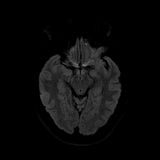
[im 46/80]
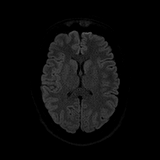
[im 57/80]
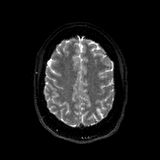
[im 68/80]
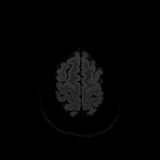
[im 80/80]
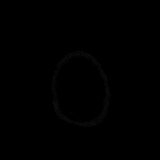

[Series 6: ax dwi_adc · axial · 3.0mm · 1.50mm/px · z∈[-63,+77]mm · 3 of 40 slices shown]
[im 1/40]
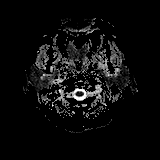
[im 20/40]
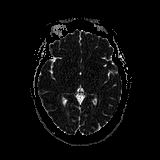
[im 40/40]
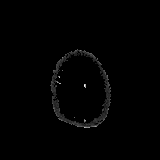

[Series 7: DWI · coronal · 4.0mm · 0.88mm/px · 5 of 64 slices shown (1 of 2)]
[im 1/64]
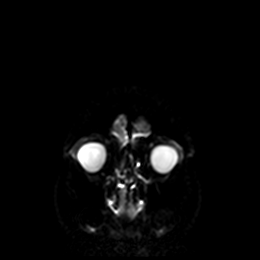
[im 16/64]
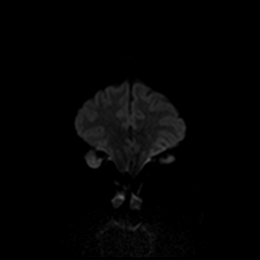
[im 32/64]
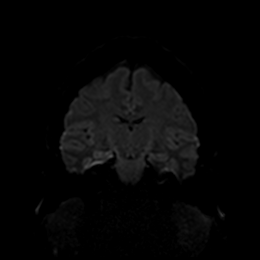
[im 48/64]
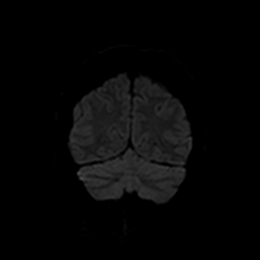
[im 64/64]
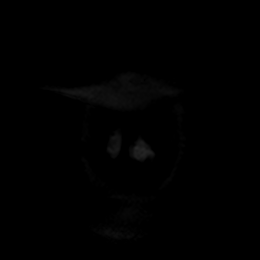

[Series 8: DWI · coronal · 4.0mm · 0.88mm/px · 3 of 32 slices shown (2 of 2)]
[im 1/32]
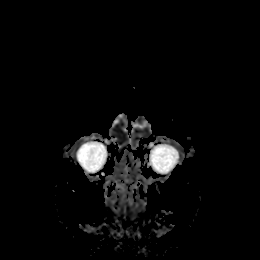
[im 16/32]
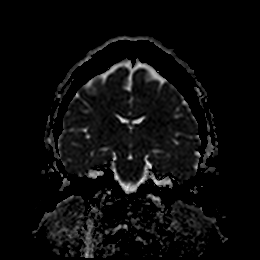
[im 32/32]
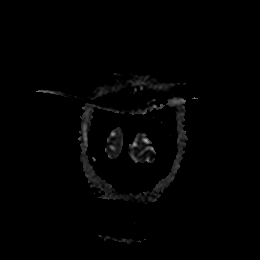

[Series 9: T1 · sagittal · 5.0mm · 0.75mm/px · 2 of 23 slices shown]
[im 1/23]
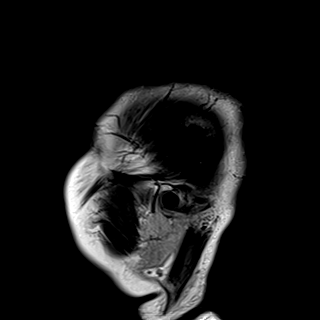
[im 23/23]
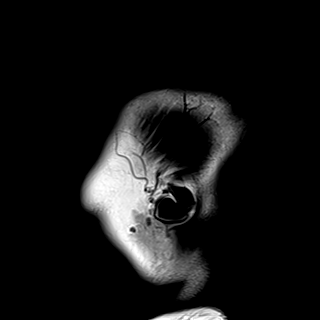

[Series 10: T2 · axial · 5.0mm · 0.69mm/px · z∈[-65,+78]mm · 2 of 25 slices shown (1 of 2)]
[im 1/25]
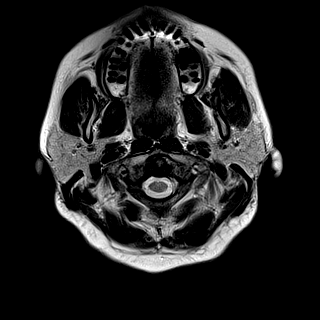
[im 25/25]
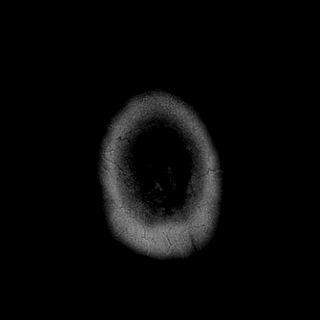

[Series 11: FLAIR · axial · 5.0mm · 0.43mm/px · z∈[-65,+79]mm · 2 of 25 slices shown]
[im 1/25]
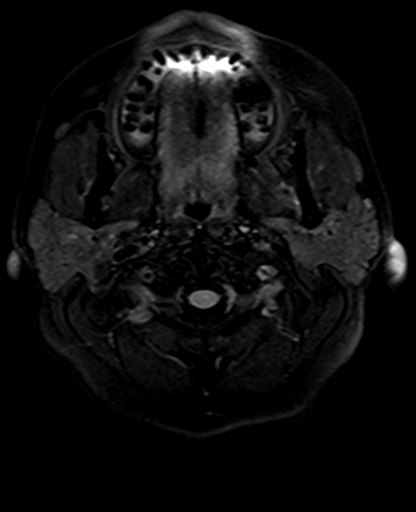
[im 25/25]
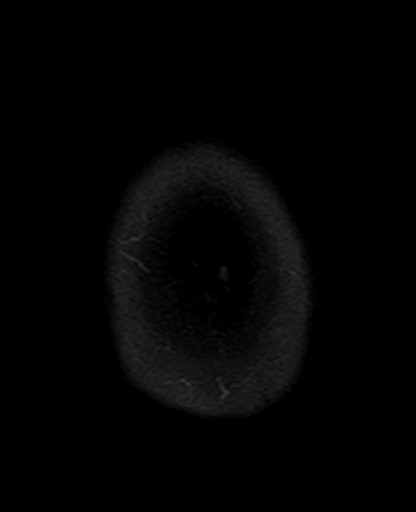

[Series 12: swi_images · axial · 3.0mm · 0.86mm/px · z∈[-73,+103]mm · 5 of 60 slices shown]
[im 1/60]
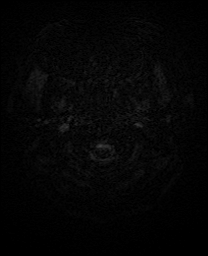
[im 15/60]
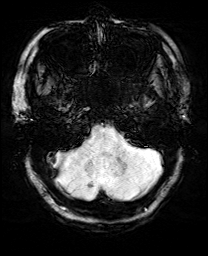
[im 30/60]
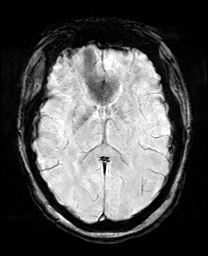
[im 45/60]
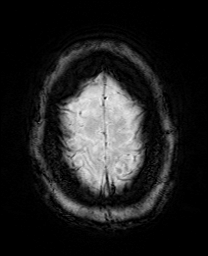
[im 60/60]
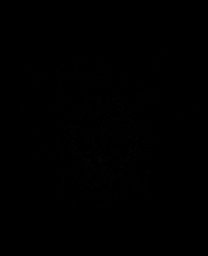

[Series 13: mip_images(sw) · axial · 24.0mm · 0.86mm/px · z∈[-63,+93]mm · 4 of 53 slices shown]
[im 1/53]
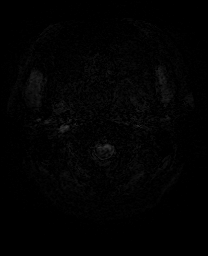
[im 18/53]
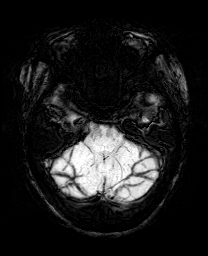
[im 35/53]
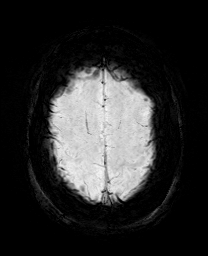
[im 53/53]
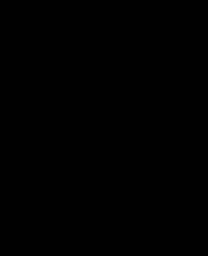

[Series 15: T2 · coronal · 5.0mm · 0.72mm/px · 2 of 28 slices shown (2 of 2)]
[im 1/28]
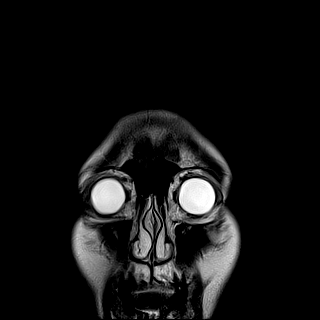
[im 28/28]
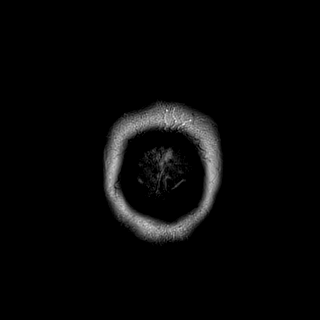

[Series 17: T1 post-contrast · coronal · 5.0mm · 0.34mm/px · 2 of 28 slices shown]
[im 1/28]
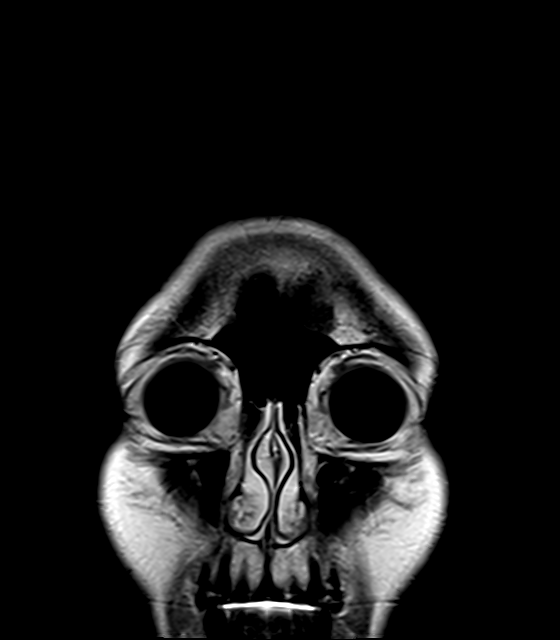
[im 28/28]
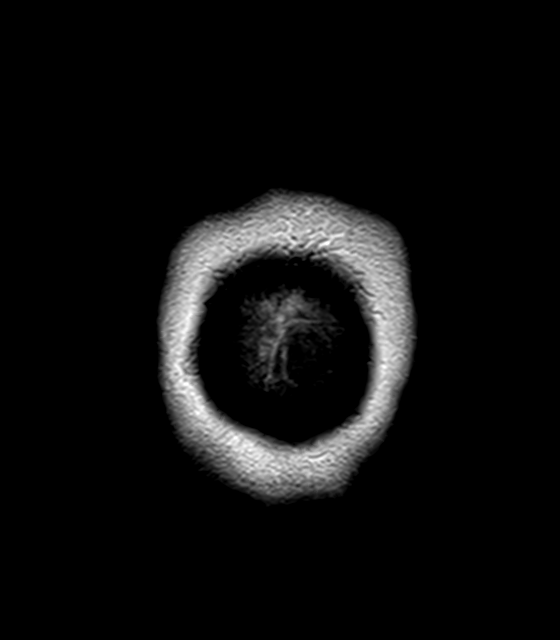

[38 of 48 positions shown; findings below may reference images not displayed]

FINDINGS: BRAIN: The midline structures are normal. There is no acute infarct
or acute hemorrhage. There is no mass lesion or other mass effect.
There is no hydrocephalus, dural abnormality or extra-axial
collection. The white matter signal is normal for the patient's age.
No age-advanced or lobar predominant atrophy. No chronic
microhemorrhage or superficial siderosis. Redemonstration of
calcification in the right cerebellar hemisphere.

VASCULAR: Major intracranial arterial and venous sinus flow voids
are preserved.

SKULL AND UPPER CERVICAL SPINE: The visualized skull base,
calvarium, upper cervical spine and extracranial soft tissues are
normal.

SINUSES/ORBITS: No fluid levels or advanced mucosal thickening. No
mastoid or middle ear effusion. The orbits are normal.
IMPRESSION: Negative MRI brain.

## 2019-04-24 IMAGING — CT CT HEAD CODE STROKE
4 series · 16 of 47 positions shown, 18 images · non-contrast
Comparison: None.

ADDENDUM:
Critical Value/emergent results text paged to Dr.Bao, Neurology
via AMION secure system on 12/08/2017 at [DATE], including
interpreting physician's phone number. NOT to Bigz Adrien.
CLINICAL DATA: Code stroke. Facial droop and weakness. History of
cluster headache, hypertension, diabetes, tuberous sclerosis.

EXAM:
CT HEAD WITHOUT CONTRAST
TECHNIQUE: Contiguous axial images were obtained from the base of the skull
through the vertex without intravenous contrast.

[Series 3: head wo · axial · 0.43mm/px · z∈[+1122,+1242]mm · 7 of 32 slices shown, 9 images]
[im 4/32  brain]
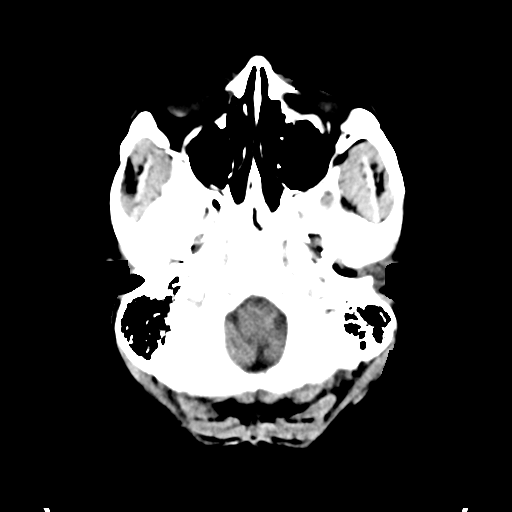
[im 4/32  bone]
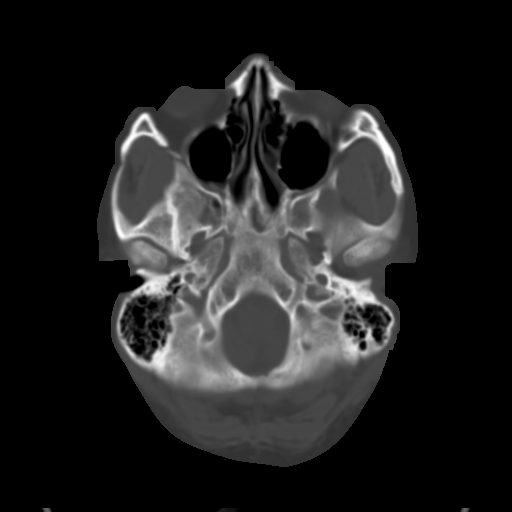
[im 8/32  brain]
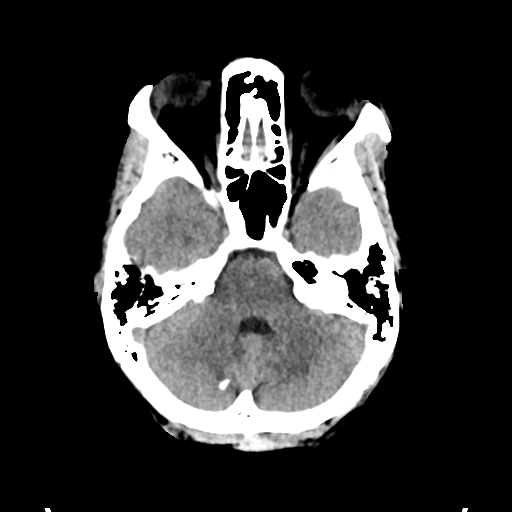
[im 12/32  brain]
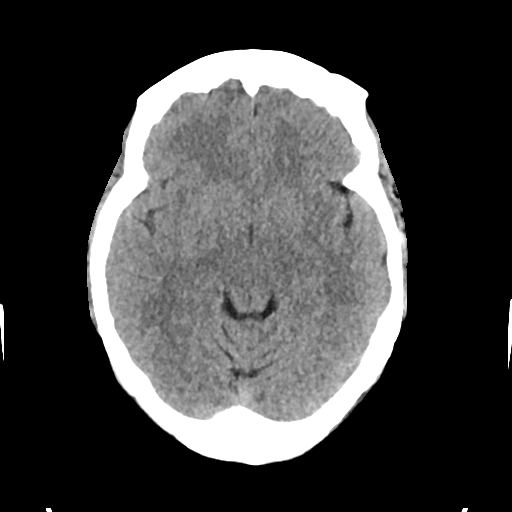
[im 16/32  brain]
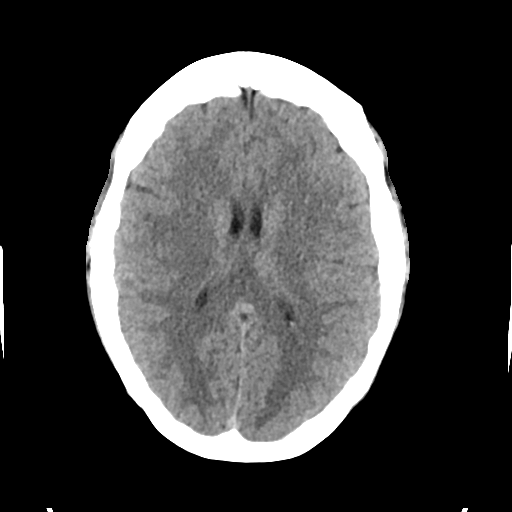
[im 20/32  brain]
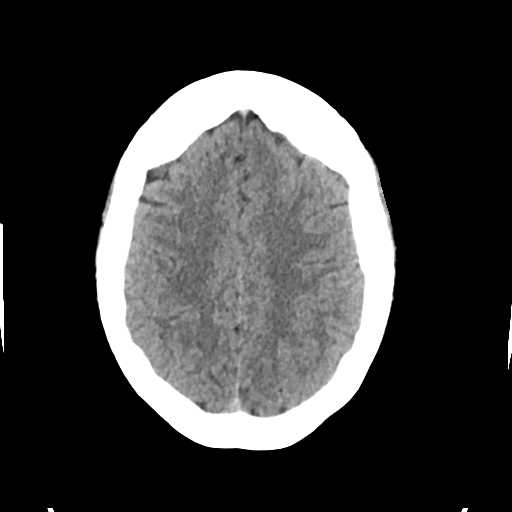
[im 20/32  bone]
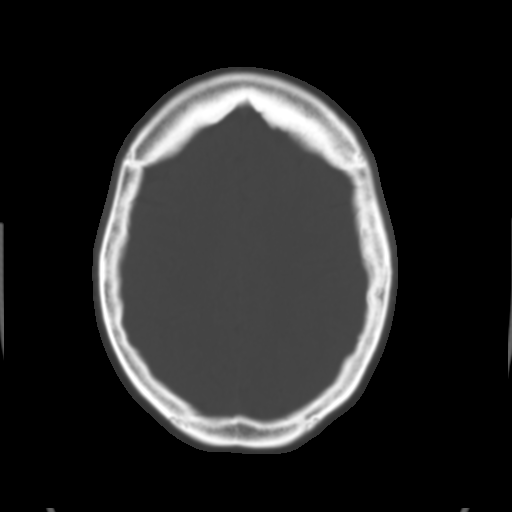
[im 24/32  brain]
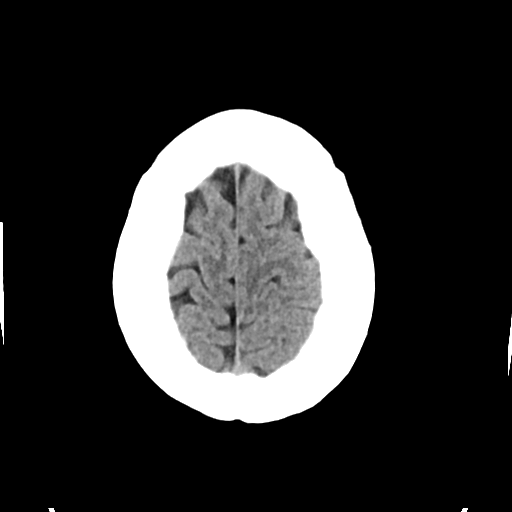
[im 28/32  brain]
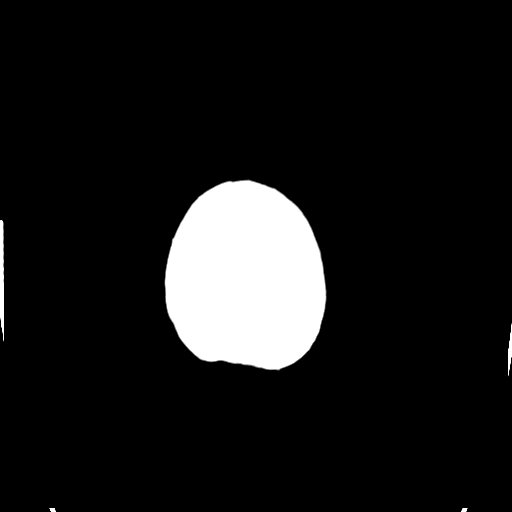

[Series 4: head bone · axial · 0.43mm/px · z∈[+1122,+1154]mm · 3 of 80 slices shown]
[im 8/80  bone]
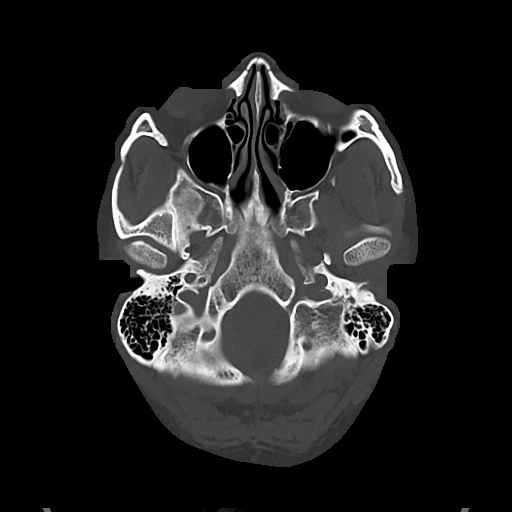
[im 16/80  bone]
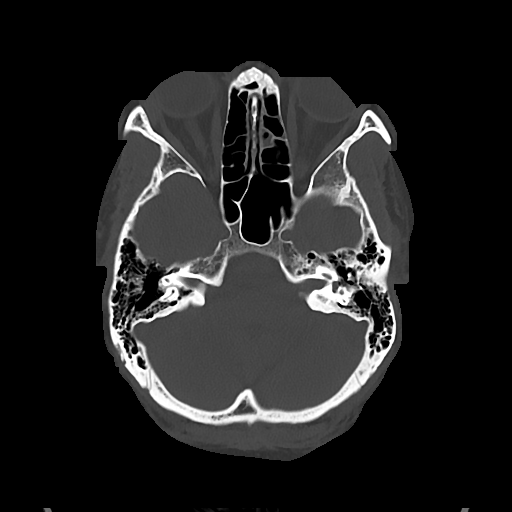
[im 24/80  bone]
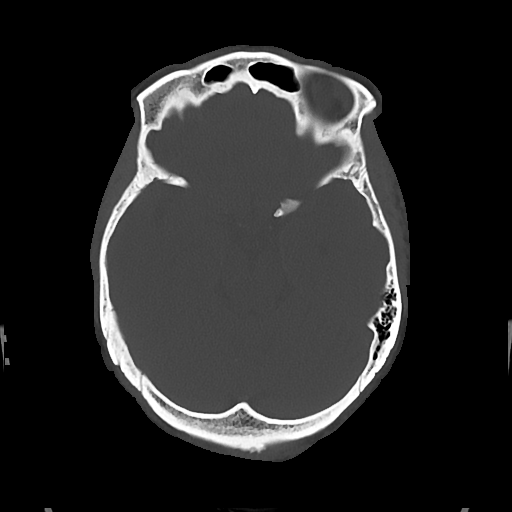

[Series 5: cor soft · coronal · 0.31mm/px · 3 of 68 slices shown]
[im 23/68  brain]
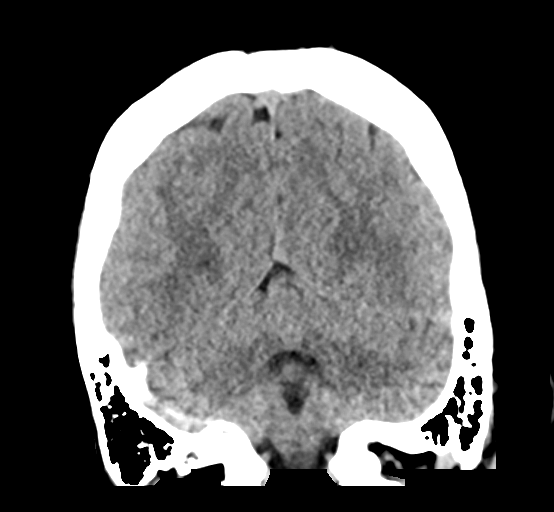
[im 30/68  brain]
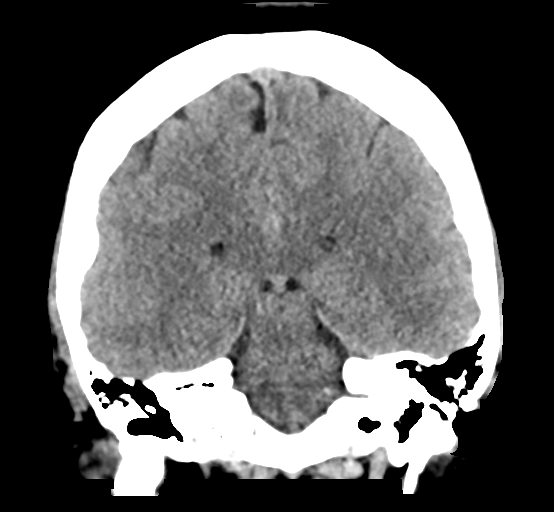
[im 38/68  brain]
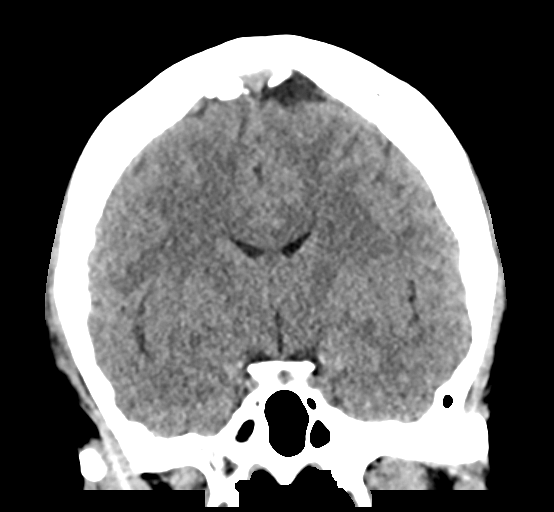

[Series 6: sag soft · sagittal · 0.31mm/px · 3 of 56 slices shown]
[im 19/56  brain]
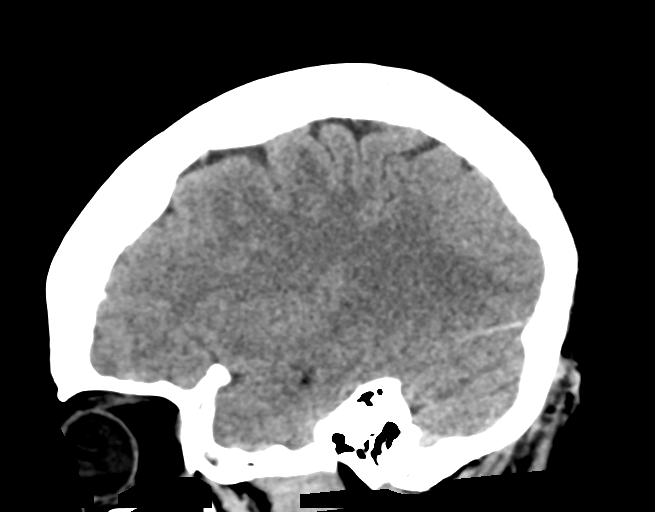
[im 28/56  brain]
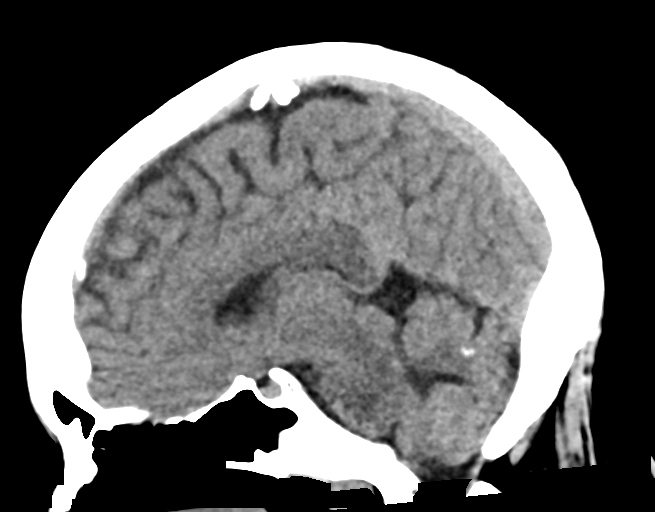
[im 37/56  brain]
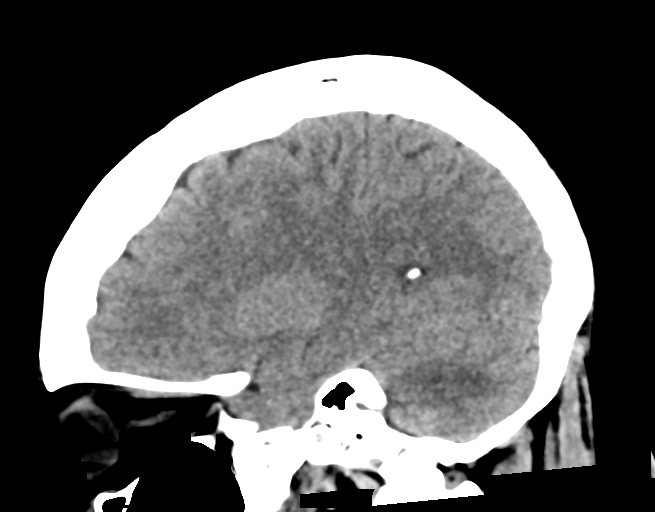

[16 of 47 positions shown; findings below may reference images not displayed]

FINDINGS: BRAIN: No intraparenchymal hemorrhage, mass effect nor midline
shift. The ventricles and sulci are normal. No acute large vascular
territory infarcts. Subcentimeter calcification right cerebellum,
nonspecific for tuber.. No abnormal extra-axial fluid collections.
Basal cisterns are patent.

VASCULAR: Unremarkable.

SKULL/SOFT TISSUES: No skull fracture. No significant soft tissue
swelling.

ORBITS/SINUSES: The included ocular globes and orbital contents are
normal.Mild paranasal sinus mucosal thickening. Status post
bilateral ocular lens implants.

OTHER: None.

ASPECTS (Alberta Stroke Program Early CT Score)

- Ganglionic level infarction (caudate, lentiform nuclei, internal
capsule, insula, M1-M3 cortex): 7

- Supraganglionic infarction (M4-M6 cortex): 3

Total score (0-10 with 10 being normal): 10
IMPRESSION: 1. Negative noncontrast CT head.
2. ASPECTS is 10.
3. Critical Value/emergent results text paged to Dr.RADHAMES MAHLER
via AMION secure system on 12/08/2017 at [DATE], including
interpreting physician's phone number.

## 2019-06-28 ENCOUNTER — Other Ambulatory Visit: Payer: Self-pay | Admitting: Student in an Organized Health Care Education/Training Program

## 2019-06-28 DIAGNOSIS — G894 Chronic pain syndrome: Secondary | ICD-10-CM

## 2019-06-28 NOTE — Progress Notes (Signed)
Orders Placed This Encounter  Procedures  . UDS (Compliance-13) ([facility]) ([facility]) (Established Pt.)    Volume: 30 ml(s). Minimum 3 ml of urine is needed. Document temperature of fresh sample. Indications: Long term (current) use of opiate analgesic (Y70.623)

## 2019-07-03 LAB — TOXASSURE SELECT 13 (MW), URINE

## 2019-07-12 ENCOUNTER — Encounter: Payer: Self-pay | Admitting: Student in an Organized Health Care Education/Training Program

## 2019-07-12 ENCOUNTER — Telehealth: Payer: Self-pay | Admitting: *Deleted

## 2019-07-12 NOTE — Telephone Encounter (Signed)
Attempted to call for review of allergies/meds. Message left.

## 2019-07-12 NOTE — Telephone Encounter (Signed)
She returned your call to go over medicines.

## 2019-07-13 ENCOUNTER — Encounter: Payer: Self-pay | Admitting: Student in an Organized Health Care Education/Training Program

## 2019-07-13 ENCOUNTER — Other Ambulatory Visit: Payer: Self-pay

## 2019-07-13 ENCOUNTER — Ambulatory Visit
Payer: Medicare PPO | Attending: Student in an Organized Health Care Education/Training Program | Admitting: Student in an Organized Health Care Education/Training Program

## 2019-07-13 DIAGNOSIS — E7402 Pompe disease: Secondary | ICD-10-CM

## 2019-07-13 DIAGNOSIS — G894 Chronic pain syndrome: Secondary | ICD-10-CM | POA: Diagnosis not present

## 2019-07-13 DIAGNOSIS — G7111 Myotonic muscular dystrophy: Secondary | ICD-10-CM | POA: Diagnosis not present

## 2019-07-13 MED ORDER — BELBUCA 150 MCG BU FILM: 1.0000 | ORAL_FILM | Freq: Two times a day (BID) | BUCCAL | 2 refills | Status: AC

## 2019-07-13 NOTE — Progress Notes (Signed)
Patient's Name: Vickie Stevens  MRN: [id number]  Referring Provider: Jodi Marble, MD  DOB: June 28, 1971  PCP: Jodi Marble, MD  DOS: 07/13/2019  Note by: Gillis Santa, MD  Service setting: Virtual Visit (Telephone)  Attending: Gillis Santa, MD  Location: Telephone Encounter  Specialty: Interventional Pain Management  Patient type: Established   Virtual Encounter - Pain Management PROVIDER NOTE: Information contained herein reflects review and annotations entered in association with encounter. Interpretation of such information and data should be left to medically-trained personnel. Information provided to patient can be located elsewhere in the medical record under "Patient Instructions". Document created using STT-dictation technology, any transcriptional errors that may result from process are unintentional.    Contact & Pharmacy Preferred: [phone] Home: [phone] (home) Mobile: [phone] (mobile) E-mail: [email or web address]  [facility], [place] [address] Phone: [phone] Fax: [phone]  [facility] [place] [place] [facility] [address] [place] [address] Phone: [phone] Fax: [phone]  [id number] [place] [address] [place] [address]: [phone]: [phone] [id number] [place] [id number]: [phone]: [phone] Pre-screening  Ms. Lavalais offered "in-person" vs "virtual" encounter. She indicated preferring virtual for this encounter.   Reason COVID-19*  Social distancing based on CDC and AMA recommendations.   I contacted EiDeveron Furlongn 07/13/2019 via telephone.      I clearly identified myself as BiGillis SantaMD. I verified that I was speaking with the correct person using two identifiers  (Name: EiJOYIA RIEHLEand date of birth: 7/Dec 20, 1970  Consent I sought verbal advanced consent from EiDeveron Furlongor virtual visit interactions. I informed Ms. KhLaflamf possible security and privacy concerns, risks, and limitations associated with providing "not-in-person" medical evaluation and management services. I also informed Ms. KhPolandf the availability of "in-person" appointments. Finally, I informed her that there would be a charge for the virtual visit and that she could be  personally, fully or partially, financially responsible for it. Ms. KhGonetxpressed understanding and agreed to proceed.   This visit was completed via telephone due to the restrictions of the COVID-19 pandemic. All issues as above were discussed and addressed but no physical exam was performed. If it was felt that the patient should be evaluated in the office, they were directed there. The patient verbally consented to this visit. Patient was unable to complete an audio/visual visit due to Technical difficulties and/or Lack of internet. Due to the catastrophic nature of the COVID-19 pandemic, this visit was done through audio contact only.  Location of the patient: home address (see Epic for details)  Location of the provider: office  Historic Elements   Ms. EiSHRADDHA LEBRONs a [age].o. year old, female patient evaluated today after her last encounter by our practice on 07/12/2019. Ms. 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 Ms. Sweis has a current medication list which includes the following prescription(s): atorvastatin, dextran 70-hypromellose (pf), fluoxetine, linaclotide, loratadine, metformin, montelukast, narcan, quetiapine fumarate, and belbuca, and the following Facility-Administered Medications: bupivacaine, bupivacaine (pf), lactated ringers, orphenadrine, and triamcinolone acetonide. She  reports that she has never smoked. She has never used smokeless tobacco. She reports that she does not drink alcohol or use drugs. Ms. Rhodus is allergic to phenergan [promethazine hcl]; codeine; cortisone; lidocaine; and oxycodone.   HPI  Today, she is being contacted for medication management.   Has started aquatic therapy and is really finding benefit from it. Hopes to continue it.  Patient's pain is at baseline.  Patient continues multimodal pain regimen as prescribed.  States that it provides pain relief and improvement in functional status.  Pharmacotherapy Assessment  Analgesic:  05/10/2019  2   03/16/2019  Belbuca 150 MCG Film  60.00  30 Bi Lat   [id number]   Nor (0290)   1  0.30 mg  Medicare   [place]     Monitoring: Pharmacotherapy: No side-effects or adverse reactions reported. [place] PMP: PDMP reviewed during this encounter.       Compliance: No problems identified. Effectiveness: Clinically acceptable. Plan: Refer to "POC".  UDS:  Summary  Date Value Ref Range Status  06/28/2019 Note  Final    Comment:    ==================================================================== [facility]ox[facility] Select 13 (MW) ==================================================================== Test                             Result       Flag       Units Drug Present not Declared for Prescription Verification   Buprenorphine                  13           UNEXPECTED ng/mg  creat   Norbuprenorphine               21           UNEXPECTED ng/mg creat    Source of buprenorphine is a scheduled prescription medication.    Norbuprenorphine is an expected metabolite of buprenorphine. ==================================================================== Test                      Result    Flag   Units      Ref Range   Creatinine              82               mg/dL      >=20 ==================================================================== Declared Medications:  The flagging and interpretation on this report are based on the  following declared medications.  Unexpected results may arise from  inaccuracies in the declared medications.  **Note: The testing scope of this panel does not include the  following reported medications:  Atorvastatin  Chlorthalidone  Eye Drop  Fluoxetine  Linaclotide  Lisinopril  Loratadine  Metformin  Montelukast  Naloxone  Quetiapine ==================================================================== For clinical consultation, please call [phone]. ====================================================================    Laboratory Chemistry Profile (12 mo)  Renal: No results found for requested labs within last 8760 hours.  Lab Results  Component Value Date   GFRAA >60 12/08/2017   GFRNONAA >60 12/08/2017   Hepatic: No results found for requested labs within last 8760 hours. Lab Results  Component Value Date   AST 18 12/08/2017  ALT 14 12/08/2017   Other: No results found for requested labs within last 8760 hours. Note: Above Lab results reviewed.  Imaging  CT HEAD CODE STROKE WO CONTRAST Addendum: ADDENDUM REPORT: 12/09/2017 13:26   ADDENDUM:  Critical Value/emergent results text paged to Jeannette, Neurology  via AMION secure system on 12/08/2017 at 8:44 pm, including  interpreting physician's phone number. NOT to [employer].   Electronically Signed    By: Elon Alas M.D.    On: 12/09/2017  13:26 Narrative: CLINICAL DATA:  Code stroke. Facial droop and weakness. History of cluster headache, hypertension, diabetes, tuberous sclerosis.  EXAM: CT HEAD WITHOUT CONTRAST  TECHNIQUE: Contiguous axial images were obtained from the base of the skull through the vertex without intravenous contrast.  COMPARISON:  None.  FINDINGS: BRAIN: No intraparenchymal hemorrhage, mass effect nor midline shift. The ventricles and sulci are normal. No acute large vascular territory infarcts. Subcentimeter calcification right cerebellum, nonspecific for tuber. No abnormal extra-axial fluid collections. Basal cisterns are patent.  VASCULAR: Unremarkable.  SKULL/SOFT TISSUES: No skull fracture. No significant soft tissue swelling.  ORBITS/SINUSES: The included ocular globes and orbital contents are normal.Mild paranasal sinus mucosal thickening. Status post bilateral ocular lens implants.  OTHER: None.  ASPECTS [facility] Stroke Program Early CT Score)  - Ganglionic level infarction (caudate, lentiform nuclei, internal capsule, insula, M1-M3 cortex): 7  - Supraganglionic infarction (M4-M6 cortex): 3  Total score (0-10 with 10 being normal): 10  IMPRESSION: 1. Negative noncontrast CT head. 2. ASPECTS is 10. 3. Critical Value/emergent results text paged to Dr.SCOTT GOLDSTON via AMION secure system on 12/08/2017 at 8:44 pm, including interpreting physician's phone number.  Electronically Signed: By: Elon Alas M.D. On: 12/08/2017 20:46   Assessment  The primary encounter diagnosis was Chronic pain syndrome. 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  Plan of Care  I have discontinued Deveron Furlong "Hano"'s chlorthalidone and lisinopril. I am also having her start on Belbuca. Additionally, I am having her maintain her atorvastatin, FLUoxetine, Narcan, metFORMIN, QUEtiapine Fumarate,  loratadine, Dextran 70-Hypromellose (PF), montelukast, and linaCLOtide (LINZESS PO). We will continue to administer lactated ringers, bupivacaine, bupivacaine (PF), orphenadrine, and triamcinolone acetonide.  Pharmacotherapy (Medications Ordered): Meds ordered this encounter  Medications  . Buprenorphine HCl (BELBUCA) 150 MCG FILM    Sig: Place 1 Film inside cheek every 12 (twelve) hours. 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    Recent Visits No visits were found meeting these conditions.  Showing recent visits within past 90 days and meeting all other requirements   Today's Visits Date Type Provider Dept  07/13/19 Office Visit Gillis Santa, MD [facility]  Showing today's visits and meeting all other requirements   Future Appointments No visits were found meeting these conditions.  Showing future appointments within next 90 days and meeting all other requirements   I discussed the assessment and treatment plan with the patient. The patient was provided an opportunity to ask questions and all were answered. The patient agreed with the plan and demonstrated an understanding of the instructions.  Patient advised to call back or seek an in-person evaluation if the symptoms or condition worsens.  Total duration of non-face-to-face encounter: 30 minutes.  Note by: Gillis Santa, MD Date: 07/13/2019;  Time: 10:12 AM

## 2019-08-23 ENCOUNTER — Telehealth: Payer: Self-pay

## 2019-08-23 NOTE — Telephone Encounter (Signed)
Patient states that her insurance has changed the tier level has been changed from a 3 to 4 for the [facility] with [employer].  She had to pay $178 last time OOP.  She saw her PCP and they told her to have Dr Holley Raring call and ask for tier reduction d/t medical necessity of medication.  I told her that I would check with Dr Holley Raring about this.

## 2019-08-23 NOTE — Telephone Encounter (Signed)
Called to [facility] to ask for Tier Reduction on Belbuca HCL 150 mcg every 12 hours.   [employer] representative states that there is a questionaire of 22 questions and asked if I would like this faxed.  They are going to fax form to [phone].    Turn around time is 72 hours once submitted [employer] # to check status [phone] Case ID [id number]  Called patient to let her know status.

## 2019-08-23 NOTE — Telephone Encounter (Signed)
For the [facility] She states she needs this medication tier level reduced? Due to her insurance specifications.  I couldn't find it in her chart but she states Dr. Holley Raring writes it for her.

## 2019-09-07 ENCOUNTER — Telehealth: Payer: Self-pay | Admitting: Student in an Organized Health Care Education/Training Program

## 2019-09-07 NOTE — Telephone Encounter (Signed)
PA resent via [facility]

## 2019-09-07 NOTE — Telephone Encounter (Signed)
Pharmacy is telling her she needs PA from [facility] for [place] 150.

## 2019-09-11 ENCOUNTER — Telehealth: Payer: Self-pay | Admitting: Student in an Organized Health Care Education/Training Program

## 2019-09-11 NOTE — Telephone Encounter (Signed)
Pt called stating that she was checking on the status of the PA for [facility]

## 2019-09-11 NOTE — Telephone Encounter (Signed)
Patient  notified that PA was resubmitted.

## 2019-09-13 ENCOUNTER — Other Ambulatory Visit: Payer: Self-pay | Admitting: Nurse Practitioner

## 2019-10-10 ENCOUNTER — Encounter: Payer: Self-pay | Admitting: Student in an Organized Health Care Education/Training Program

## 2019-10-10 ENCOUNTER — Encounter: Payer: Medicare PPO | Admitting: Student in an Organized Health Care Education/Training Program

## 2019-10-11 ENCOUNTER — Other Ambulatory Visit: Payer: Self-pay

## 2019-10-11 ENCOUNTER — Encounter: Payer: Self-pay | Admitting: Student in an Organized Health Care Education/Training Program

## 2019-10-11 ENCOUNTER — Ambulatory Visit
Payer: Medicare PPO | Attending: Student in an Organized Health Care Education/Training Program | Admitting: Student in an Organized Health Care Education/Training Program

## 2019-10-11 ENCOUNTER — Other Ambulatory Visit: Payer: Self-pay | Admitting: Nurse Practitioner

## 2019-10-11 DIAGNOSIS — E7402 Pompe disease: Secondary | ICD-10-CM

## 2019-10-11 DIAGNOSIS — M5416 Radiculopathy, lumbar region: Secondary | ICD-10-CM

## 2019-10-11 DIAGNOSIS — G894 Chronic pain syndrome: Secondary | ICD-10-CM

## 2019-10-11 DIAGNOSIS — G7111 Myotonic muscular dystrophy: Secondary | ICD-10-CM | POA: Diagnosis not present

## 2019-10-11 DIAGNOSIS — R921 Mammographic calcification found on diagnostic imaging of breast: Secondary | ICD-10-CM

## 2019-10-11 MED ORDER — BELBUCA 150 MCG BU FILM: 1.0000 | ORAL_FILM | Freq: Two times a day (BID) | BUCCAL | 2 refills | Status: DC

## 2019-10-11 NOTE — Progress Notes (Signed)
Patient: Vickie Stevens  Service Category: E/M  Provider: Gillis Santa, MD  DOB: 11-Mar-1971  DOS: 10/11/2019  Location: Office  MRN: [id number]  Setting: Ambulatory outpatient  Referring Provider: Jodi Marble, MD  Type: Established Patient  Specialty: Interventional Pain Management  PCP: Vickie Marble, MD  Location: Home  Delivery: TeleHealth     Virtual Encounter - Pain Management PROVIDER NOTE: Information contained herein reflects review and annotations entered in association with encounter. Interpretation of such information and data should be left to medically-trained personnel. Information provided to patient can be located elsewhere in the medical record under "Patient Instructions". Document created using STT-dictation technology, any transcriptional errors that may result from process are unintentional.    Contact & Pharmacy Preferred: [phone] Home: [phone] (home) Mobile: [phone] (mobile) E-mail: [email or web address]  [facility], [place] [address] Phone: [phone] Fax: [phone]  [facility] [place] [address] [place] [address] Phone: [phone] Fax: [phone]  [id number] [place] [address] [place] [address]: [phone]: [phone] [id number] [place] [address]: [phone]: [phone] Pre-screening  Vickie Stevens offered "in-person" vs "virtual" encounter. She indicated preferring virtual for this encounter.   Reason COVID-19*  Social distancing based on CDC and AMA recommendations.   I contacted Vickie Furlongn 10/11/2019 via telephone.      I clearly identified myself as BiGillis SantaMD. I verified that I was speaking with the correct person using two identifiers  (Name: Vickie CUPPLESand date of birth: 01/04/23/1972  This visit was completed via telephone due to the restrictions of the COVID-19 pandemic. All issues as above were discussed and addressed but no physical exam was performed. If it was felt that the patient should be evaluated in the office, they were directed there. The patient verbally consented to this visit. Patient was unable to complete an audio/visual visit due to Technical difficulties and/or Lack of internet. Due to the catastrophic nature of the COVID-19 pandemic, this visit was done through audio contact only.  Location of the patient: home address (see Epic for details)  Location of the provider: office  Consent I sought verbal advanced consent from Vickie Furlongor virtual visit interactions. I informed [name] possible security and privacy concerns, risks, and limitations associated with providing "not-in-person" medical evaluation and management services. I also informed [name] the availability of "in-person" appointments. Finally, I informed her that there would be a charge for the virtual visit and that she could be  personally, fully or partially, financially responsible for it. [name] understanding and agreed to proceed.   Historic Elements   Ms. Vickie SCHMUHLs a [age].o. year old, female patient evaluated today after her last contact with our practice on 09/11/2019. Ms. KhSiskhas a past medical history of ADHD (attention deficit hyperactivity disorder), Biceps tendonitis, Bipolar disorder ([place] Chronic back pain, Cluster headache, Crohn's disease ([facility] DDD (degenerative disc disease), lumbar, Depression, Diabetes mellitus without complication ([place] Hypercholesteremia, Hypertension, PCOS (polycystic ovarian syndrome), Rotator cuff tendinitis, Sleep apnea, and Tuberous sclerosis ([facility] She also  has a past surgical history that includes Endometrial ablation (01-2011); Shoulder surgery (Left,  07-1999, 07-2011); Cataract extraction, bilateral; Esophagogastroduodenoscopy (egd)  with propofol (N/A, 06/08/2018); and Colonoscopy with propofol (N/A, 06/08/2018). Vickie Stevens has a current medication list which includes the following prescription(s): amlodipine, atorvastatin, cholecalciferol, dextran 70-hypromellose (pf), fluoxetine, loratadine, metformin, montelukast, olmesartan, quetiapine fumarate, belbuca, linaclotide, and narcan, and the following Facility-Administered Medications: bupivacaine, bupivacaine (pf), lactated ringers, orphenadrine, and triamcinolone acetonide. She  reports that she has never smoked. She has never used smokeless tobacco. She reports that she does not drink alcohol or use drugs. Vickie Stevens is allergic to phenergan [promethazine hcl]; codeine; cortisone; lidocaine; and oxycodone.   HPI  Today, she is being contacted for medication management.   Increased LBP radiating to right>left leg to ankle in a dermatomal fashion. No inciting or traumatic event. Has had epidurals in the past- states that they were helpful Endorsing moderate constipation.  Does utilize a diet that is high in fiber.  Has tried Linzess in the past but insurance would not cover it currently.   Pharmacotherapy Assessment  Analgesic:  09/11/2019  2   07/13/2019  Belbuca 150 MCG Film  60.00  30 Bi Lat   [id number]   Nor (0290)   2  0.30 mg  Medicare   [place]    Monitoring: [place] PMP: PDMP reviewed during this encounter.       Pharmacotherapy: No side-effects or adverse reactions reported. Compliance: No problems identified. Effectiveness: Clinically acceptable. Plan: Refer to "POC".  UDS:  Summary  Date Value Ref Range Status  06/28/2019 Note  Final    Comment:    ==================================================================== [employer]ox[employer] Select 13 (MW) ==================================================================== Test                             Result       Flag       Units Drug Present  not Declared for Prescription Verification   Buprenorphine                  13           UNEXPECTED ng/mg creat   Norbuprenorphine               21           UNEXPECTED ng/mg creat    Source of buprenorphine is a scheduled prescription medication.    Norbuprenorphine is an expected metabolite of buprenorphine. ==================================================================== Test                      Result    Flag   Units      Ref Range   Creatinine              82               mg/dL      >=20 ==================================================================== Declared Medications:  The flagging and interpretation on this report are based on the  following declared medications.  Unexpected results may arise from  inaccuracies in the declared medications.  **Note: The testing scope of this panel does not include the  following reported medications:  Atorvastatin  Chlorthalidone  Eye Drop  Fluoxetine  Linaclotide  Lisinopril  Loratadine  Metformin  Montelukast  Naloxone  Quetiapine ==================================================================== For clinical consultation, please call [phone]. ====================================================================    Laboratory Chemistry Profile   Renal Lab Results  Component Value Date   BUN 22 (H) 12/08/2017   CREATININE 0.60 12/08/2017   GFRAA >60 12/08/2017   GFRNONAA >60 12/08/2017     Hepatic Lab  Results  Component Value Date   AST 18 12/08/2017   ALT 14 12/08/2017   ALBUMIN 3.3 (L) 12/08/2017   ALKPHOS 75 12/08/2017     Electrolytes Lab Results  Component Value Date   NA 139 12/08/2017   K 3.1 (L) 12/08/2017   CL 103 12/08/2017   CALCIUM 8.9 12/08/2017     Bone No results found for: VD25OH, VD125OH2TOT, EU2353IR4, ER1540GQ6, 25OHVITD1, 25OHVITD2, 25OHVITD3, TESTOFREE, TESTOSTERONE   Inflammation (CRP: Acute Phase) (ESR: Chronic Phase) No results found for: CRP, ESRSEDRATE, LATICACIDVEN      Note: Above Lab results reviewed.  Imaging  CT HEAD CODE STROKE WO CONTRAST Addendum: ADDENDUM REPORT: 12/09/2017 13:26   ADDENDUM:  Critical Value/emergent results text paged to [place], Neurology  via AMION secure system on 12/08/2017 at 8:44 pm, including  interpreting physician's phone number. NOT to [employer].   Electronically Signed    By: Elon Alas M.D.    On: 12/09/2017 13:26 Narrative: CLINICAL DATA:  Code stroke. Facial droop and weakness. History of cluster headache, hypertension, diabetes, tuberous sclerosis.  EXAM: CT HEAD WITHOUT CONTRAST  TECHNIQUE: Contiguous axial images were obtained from the base of the skull through the vertex without intravenous contrast.  COMPARISON:  None.  FINDINGS: BRAIN: No intraparenchymal hemorrhage, mass effect nor midline shift. The ventricles and sulci are normal. No acute large vascular territory infarcts. Subcentimeter calcification right cerebellum, nonspecific for tuber. No abnormal extra-axial fluid collections. Basal cisterns are patent.  VASCULAR: Unremarkable.  SKULL/SOFT TISSUES: No skull fracture. No significant soft tissue swelling.  ORBITS/SINUSES: The included ocular globes and orbital contents are normal.Mild paranasal sinus mucosal thickening. Status post bilateral ocular lens implants.  OTHER: None.  ASPECTS [facility] Stroke Program Early CT Score)  - Ganglionic level infarction (caudate, lentiform nuclei, internal capsule, insula, M1-M3 cortex): 7  - Supraganglionic infarction (M4-M6 cortex): 3  Total score (0-10 with 10 being normal): 10  IMPRESSION: 1. Negative noncontrast CT head. 2. ASPECTS is 10. 3. Critical Value/emergent results text paged to Dr.SCOTT GOLDSTON via AMION secure system on 12/08/2017 at 8:44 pm, including interpreting physician's phone number.  Electronically Signed: By: Elon Alas M.D. On: 12/08/2017 20:46  Assessment  The primary encounter  diagnosis was Chronic pain syndrome. 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  Plan of Care   [name] has a current medication list which includes the following long-term medication(s): amlodipine, atorvastatin, fluoxetine, loratadine, montelukast, olmesartan, and linaclotide.  1.  Refill Belbuca as below 2.  Lumbar radicular pain: Lumbar epidural steroid injection under fluoroscopy without sedation  Pharmacotherapy (Medications Ordered): Meds ordered this encounter  Medications  . Buprenorphine HCl (BELBUCA) 150 MCG FILM    Sig: Place 1 Film inside cheek every 12 (twelve) hours. For chronic pain syndrome    Dispense:  60 each    Refill:  2   Orders:  Orders Placed This Encounter  Procedures  . Lumbar Epidural Injection    Standing Status:   Future    Standing Expiration Date:   11/10/2019    Scheduling Instructions:     Procedure: Interlaminar Lumbar Epidural Steroid injection (LESI)            Laterality: Midline     Sedation: without     Timeframe: 2 weeks    Order Specific Question:   Where will this procedure be performed?    Answer:   [facility]   Follow-up  plan:   Return in about 2 weeks (around 10/25/2019) for L-ESI, without sedation.    Recent Visits Date Type Provider Dept  07/13/19 Office Visit Vickie Santa, MD [facility]  Showing recent visits within past 90 days and meeting all other requirements   Today's Visits Date Type Provider Dept  10/11/19 Office Visit Vickie Santa, MD [facility]  Showing today's visits and meeting all other requirements   Future Appointments No visits were found meeting these conditions.  Showing future appointments within next 90 days and meeting all other requirements   I discussed the assessment and treatment plan with the patient. The patient was provided an opportunity to ask questions and all  were answered. The patient agreed with the plan and demonstrated an understanding of the instructions.  Patient advised to call back or seek an in-person evaluation if the symptoms or condition worsens.  Duration of encounter: 25 minutes.  Note by: Vickie Santa, MD Date: 10/11/2019; Time: 1:58 PM

## 2019-10-16 ENCOUNTER — Other Ambulatory Visit: Payer: Self-pay

## 2019-10-16 ENCOUNTER — Ambulatory Visit (HOSPITAL_BASED_OUTPATIENT_CLINIC_OR_DEPARTMENT_OTHER): Payer: Medicare PPO | Admitting: Student in an Organized Health Care Education/Training Program

## 2019-10-16 ENCOUNTER — Ambulatory Visit
Admission: RE | Admit: 2019-10-16 | Discharge: 2019-10-16 | Disposition: A | Discharge status: Home / Self Care | Payer: Medicare PPO | Source: Ambulatory Visit | Attending: Student in an Organized Health Care Education/Training Program | Admitting: Student in an Organized Health Care Education/Training Program

## 2019-10-16 ENCOUNTER — Encounter: Payer: Self-pay | Admitting: Student in an Organized Health Care Education/Training Program

## 2019-10-16 DIAGNOSIS — M5416 Radiculopathy, lumbar region: Secondary | ICD-10-CM | POA: Diagnosis not present

## 2019-10-16 MED ORDER — ROPIVACAINE HCL 2 MG/ML IJ SOLN
2.0000 mL | Freq: Once | INTRAMUSCULAR | Status: AC
  Administered 2019-10-16: 11:00:00 2 mL via EPIDURAL

## 2019-10-16 MED ORDER — SODIUM CHLORIDE (PF) 0.9 % IJ SOLN
INTRAMUSCULAR | Status: AC
  Filled 2019-10-16: qty 10

## 2019-10-16 MED ORDER — IOHEXOL 180 MG/ML  SOLN
10.0000 mL | Freq: Once | INTRAMUSCULAR | Status: AC
  Administered 2019-10-16: 10 mL via EPIDURAL
  Filled 2019-10-16: qty 20

## 2019-10-16 MED ORDER — ROPIVACAINE HCL 2 MG/ML IJ SOLN
INTRAMUSCULAR | Status: AC
  Filled 2019-10-16: qty 10

## 2019-10-16 MED ORDER — LIDOCAINE HCL 2 % IJ SOLN
20.0000 mL | Freq: Once | INTRAMUSCULAR | Status: AC
  Administered 2019-10-16: 400 mg
  Filled 2019-10-16: qty 20

## 2019-10-16 MED ORDER — SODIUM CHLORIDE 0.9% FLUSH
2.0000 mL | Freq: Once | INTRAVENOUS | Status: AC
  Administered 2019-10-16: 2 mL

## 2019-10-16 MED ORDER — DEXAMETHASONE SODIUM PHOSPHATE 10 MG/ML IJ SOLN
INTRAMUSCULAR | Status: AC
  Filled 2019-10-16: qty 1

## 2019-10-16 MED ORDER — DEXAMETHASONE SODIUM PHOSPHATE 10 MG/ML IJ SOLN
10.0000 mg | Freq: Once | INTRAMUSCULAR | Status: AC
  Administered 2019-10-16: 11:00:00 10 mg

## 2019-10-16 NOTE — Progress Notes (Signed)
PROVIDER NOTE: Information contained herein reflects review and annotations entered in association with encounter. Interpretation of such information and data should be left to medically-trained personnel. Information provided to patient can be located elsewhere in the medical record under "Patient Instructions". Document created using STT-dictation technology, any transcriptional errors that may result from process are unintentional.    Patient: Vickie Stevens  Service Category: Procedure  Provider: Gillis Santa, MD  DOB: 02/10/1971  DOS: 10/16/2019  Location: [facility]  MRN: [id number]  Setting: Ambulatory - outpatient  Referring Provider: Gillis Santa, MD  Type: Established Patient  Specialty: Interventional Pain Management  PCP: Jodi Marble, MD   Primary Reason for Visit: Interventional Pain Management Treatment. CC: Back Pain (right, lower)  Procedure:          Anesthesia, Analgesia, Anxiolysis:  Type: Therapeutic Inter-Laminar Epidural Steroid Injection  #1  Region: Lumbar Level: L4-5 Level. Laterality: Midline         Type: Local Anesthesia  Local Anesthetic: Lidocaine 1-2%  Position: Prone with head of the table was raised to facilitate breathing.   Indications: 1. Lumbar radiculopathy    Pain Score: Pre-procedure: 5 /10 Post-procedure: 4 /10   Pre-op Assessment:  Vickie Stevens is a 49 y.o. (year old), female patient, seen today for interventional treatment. She  has a past surgical history that includes Endometrial ablation (01-2011); Shoulder surgery (Left, 07-1999, 07-2011); Cataract extraction, bilateral; Esophagogastroduodenoscopy (egd) with propofol (N/A, 06/08/2018); and Colonoscopy with propofol (N/A, 06/08/2018). Vickie Stevens has a current medication list which includes the following prescription(s): amlodipine, atorvastatin, belbuca, cholecalciferol, dextran 70-hypromellose (pf), fluoxetine, linaclotide, loratadine, metformin, montelukast, narcan,  olmesartan, and quetiapine fumarate, and the following Facility-Administered Medications: bupivacaine, bupivacaine (pf), lactated ringers, orphenadrine, and triamcinolone acetonide. Her primarily concern today is the Back Pain (right, lower)  Initial Vital Signs:  Pulse/HCG Rate: 95  Temp: 97.7 F (36.5 C) Resp: 16 BP: 109/81 SpO2: 99 %  BMI: Estimated body mass index is 33.97 kg/m as calculated from the following:   Height as of this encounter: 5' 9"  (1.753 m).   Weight as of this encounter: 230 lb (104.3 kg).  Risk Assessment: Allergies: Reviewed. She is allergic to phenergan [promethazine hcl]; codeine; cortisone; lidocaine; and oxycodone.  Allergy Precautions: None required Coagulopathies: Reviewed. None identified.  Blood-thinner therapy: None at this time Active Infection(s): Reviewed. None identified. Vickie Stevens is afebrile  Site Confirmation: Vickie Stevens was asked to confirm the procedure and laterality before marking the site Procedure checklist: Completed Consent: Before the procedure and under the influence of no sedative(s), amnesic(s), or anxiolytics, the patient was informed of the treatment options, risks and possible complications. To fulfill our ethical and legal obligations, as recommended by the American Medical Association's Code of Ethics, I have informed the patient of my clinical impression; the nature and purpose of the treatment or procedure; the risks, benefits, and possible complications of the intervention; the alternatives, including doing nothing; the risk(s) and benefit(s) of the alternative treatment(s) or procedure(s); and the risk(s) and benefit(s) of doing nothing. The patient was provided information about the general risks and possible complications associated with the procedure. These may include, but are not limited to: failure to achieve desired goals, infection, bleeding, organ or nerve damage, allergic reactions, paralysis, and death. In addition,  the patient was informed of those risks and complications associated to Spine-related procedures, such as failure to decrease pain; infection (i.e.: Meningitis, epidural or intraspinal abscess); bleeding (i.e.: epidural hematoma, subarachnoid hemorrhage, or any  other type of intraspinal or peri-dural bleeding); organ or nerve damage (i.e.: Any type of peripheral nerve, nerve root, or spinal cord injury) with subsequent damage to sensory, motor, and/or autonomic systems, resulting in permanent pain, numbness, and/or weakness of one or several areas of the body; allergic reactions; (i.e.: anaphylactic reaction); and/or death. Furthermore, the patient was informed of those risks and complications associated with the medications. These include, but are not limited to: allergic reactions (i.e.: anaphylactic or anaphylactoid reaction(s)); adrenal axis suppression; blood sugar elevation that in diabetics may result in ketoacidosis or comma; water retention that in patients with history of congestive heart failure may result in shortness of breath, pulmonary edema, and decompensation with resultant heart failure; weight gain; swelling or edema; medication-induced neural toxicity; particulate matter embolism and blood vessel occlusion with resultant organ, and/or nervous system infarction; and/or aseptic necrosis of one or more joints. Finally, the patient was informed that Medicine is not an exact science; therefore, there is also the possibility of unforeseen or unpredictable risks and/or possible complications that may result in a catastrophic outcome. The patient indicated having understood very clearly. We have given the patient no guarantees and we have made no promises. Enough time was given to the patient to ask questions, all of which were answered to the patient's satisfaction. Vickie Stevens has indicated that she wanted to continue with the procedure. Attestation: I, the ordering provider, attest that I have  discussed with the patient the benefits, risks, side-effects, alternatives, likelihood of achieving goals, and potential problems during recovery for the procedure that I have provided informed consent. Date  Time: 10/16/2019 10:08 AM  Pre-Procedure Preparation:  Monitoring: As per clinic protocol. Respiration, ETCO2, SpO2, BP, heart rate and rhythm monitor placed and checked for adequate function Safety Precautions: Patient was assessed for positional comfort and pressure points before starting the procedure. Time-out: I initiated and conducted the "Time-out" before starting the procedure, as per protocol. The patient was asked to participate by confirming the accuracy of the "Time Out" information. Verification of the correct person, site, and procedure were performed and confirmed by me, the nursing staff, and the patient. "Time-out" conducted as per Joint Commission's Universal Protocol (UP.01.01.01). Time: 1052  Description of Procedure:          Target Area: The interlaminar space, initially targeting the lower laminar border of the superior vertebral body. Approach: Paramedial approach. Area Prepped: Entire Posterior Lumbar Region DuraPrep (Iodine Povacrylex [0.7% available iodine] and Isopropyl Alcohol, 74% w/w) Safety Precautions: Aspiration looking for blood return was conducted prior to all injections. At no point did we inject any substances, as a needle was being advanced. No attempts were made at seeking any paresthesias. Safe injection practices and needle disposal techniques used. Medications properly checked for expiration dates. SDV (single dose vial) medications used. Description of the Procedure: Protocol guidelines were followed. The procedure needle was introduced through the skin, ipsilateral to the reported pain, and advanced to the target area. Bone was contacted and the needle walked caudad, until the lamina was cleared. The epidural space was identified using  "loss-of-resistance technique" with 2-3 ml of PF-NaCl (0.9% NSS), in a 5cc LOR glass syringe.  Vitals:   10/16/19 1013 10/16/19 1040 10/16/19 1050 10/16/19 1100  BP: 109/81 129/73 (!) 149/75 (!) 151/75  Pulse: 95 (!) 104 100 99  Resp: 16 14 15 16   Temp: 97.7 F (36.5 C)     TempSrc: Temporal     SpO2: 99% 99% 100% 100%  Weight:  230 lb (104.3 kg)     Height: 5' 9"  (1.753 m)       Start Time: 1052 hrs. End Time: 1059 hrs.  Materials:  Needle(s) Type: Epidural needle Gauge: 22G Length: 3.5-in Medication(s): Please see orders for medications and dosing details. 8 cc solution made of 5 cc of preservative-free saline, 2 cc of 0.2% ropivacaine, 1 cc of Decadron 10 mg/cc.  Imaging Guidance (Spinal):          Type of Imaging Technique: Fluoroscopy Guidance (Spinal) Indication(s): Assistance in needle guidance and placement for procedures requiring needle placement in or near specific anatomical locations not easily accessible without such assistance. Exposure Time: Please see nurses notes. Contrast: Before injecting any contrast, we confirmed that the patient did not have an allergy to iodine, shellfish, or radiological contrast. Once satisfactory needle placement was completed at the desired level, radiological contrast was injected. Contrast injected under live fluoroscopy. No contrast complications. See chart for type and volume of contrast used. Fluoroscopic Guidance: I was personally present during the use of fluoroscopy. "Tunnel Vision Technique" used to obtain the best possible view of the target area. Parallax error corrected before commencing the procedure. "Direction-depth-direction" technique used to introduce the needle under continuous pulsed fluoroscopy. Once target was reached, antero-posterior, oblique, and lateral fluoroscopic projection used confirm needle placement in all planes. Images permanently stored in EMR. Interpretation: I personally interpreted the imaging  intraoperatively. Adequate needle placement confirmed in multiple planes. Appropriate spread of contrast into desired area was observed. No evidence of afferent or efferent intravascular uptake. No intrathecal or subarachnoid spread observed. Permanent images saved into the patient's record.  Antibiotic Prophylaxis:   Anti-infectives (From admission, onward)   None     Indication(s): None identified  Post-operative Assessment:  Post-procedure Vital Signs:  Pulse/HCG Rate: 99  Temp: 97.7 F (36.5 C) Resp: 16 BP: (!) 151/75 SpO2: 100 %  EBL: None  Complications: No immediate post-treatment complications observed by team, or reported by patient.  Note: The patient tolerated the entire procedure well. A repeat set of vitals were taken after the procedure and the patient was kept under observation following institutional policy, for this type of procedure. Post-procedural neurological assessment was performed, showing return to baseline, prior to discharge. The patient was provided with post-procedure discharge instructions, including a section on how to identify potential problems. Should any problems arise concerning this procedure, the patient was given instructions to immediately contact us, at any time, without hesitation. In any case, we plan to contact the patient by telephone for a follow-up status report regarding this interventional procedure.  Comments:  No additional relevant information.   5 out of 5 strength bilateral lower extremity: Plantar flexion, dorsiflexion, knee flexion, knee extension.  Plan of Care  Orders:  Orders Placed This Encounter  Procedures  . [facility] C-ARM 1-60 MIN NO REPORT    Intraoperative interpretation by procedural physician at [facility].    Standing Status:   Standing    Number of Occurrences:   1    Order Specific Question:   Reason for exam:    Answer:   Assistance in needle guidance and placement for procedures requiring  needle placement in or near specific anatomical locations not easily accessible without such assistance.    Medications ordered for procedure: Meds ordered this encounter  Medications  . iohexol (OMNIPAQUE) 180 MG/ML injection 10 mL    Must be Myelogram-compatible. If not available, you may substitute with a water-soluble, non-ionic, hypoallergenic,  myelogram-compatible radiological contrast medium.  Marland Kitchen lidocaine (XYLOCAINE) 2 % (with pres) injection 400 mg  . ropivacaine (PF) 2 mg/mL (0.2%) (NAROPIN) injection 2 mL  . sodium chloride flush (NS) 0.9 % injection 2 mL  . dexamethasone (DECADRON) injection 10 mg   Medications administered: We administered iohexol, lidocaine, ropivacaine (PF) 2 mg/mL (0.2%), sodium chloride flush, and dexamethasone.  See the medical record for exact dosing, route, and time of administration.  Follow-up plan:   Return in about 6 weeks (around 11/27/2019) for Post Procedure Evaluation, virtual.      Status post L4-L5 ESI #1 on 10/16/2019   Recent Visits Date Type Provider Dept  10/11/19 Office Visit Gillis Santa, MD [facility]  Showing recent visits within past 90 days and meeting all other requirements   Today's Visits Date Type Provider Dept  10/16/19 Procedure visit Gillis Santa, MD [facility]  Showing today's visits and meeting all other requirements   Future Appointments Date Type Provider Dept  11/28/19 Appointment Gillis Santa, MD [facility]  01/03/20 Appointment Gillis Santa, MD [facility]  Showing future appointments within next 90 days and meeting all other requirements   Disposition: Discharge home  Discharge (Date  Time): 10/16/2019; 1105 hrs.   Primary Care Physician: Jodi Marble, MD Location: [facility] Note by: Gillis Santa, MD Date: 10/16/2019; Time: 1:04 PM  Disclaimer:  Medicine is not an exact science. The only guarantee in medicine is that  nothing is guaranteed. It is important to note that the decision to proceed with this intervention was based on the information collected from the patient. The Data and conclusions were drawn from the patient's questionnaire, the interview, and the physical examination. Because the information was provided in large part by the patient, it cannot be guaranteed that it has not been purposely or unconsciously manipulated. Every effort has been made to obtain as much relevant data as possible for this evaluation. It is important to note that the conclusions that lead to this procedure are derived in large part from the available data. Always take into account that the treatment will also be dependent on availability of resources and existing treatment guidelines, considered by other Pain Management Practitioners as being common knowledge and practice, at the time of the intervention. For Medico-Legal purposes, it is also important to point out that variation in procedural techniques and pharmacological choices are the acceptable norm. The indications, contraindications, technique, and results of the above procedure should only be interpreted and judged by a Board-Certified Interventional Pain Specialist with extensive familiarity and expertise in the same exact procedure and technique.

## 2019-10-16 NOTE — Patient Instructions (Signed)

## 2019-10-16 NOTE — Progress Notes (Signed)
Safety precautions to be maintained throughout the outpatient stay will include: orient to surroundings, keep bed in low position, maintain call bell within reach at all times, provide assistance with transfer out of bed and ambulation.  

## 2019-10-17 ENCOUNTER — Telehealth: Payer: Self-pay | Admitting: *Deleted

## 2019-10-17 NOTE — Telephone Encounter (Signed)
No problems post procedure. 

## 2019-10-19 ENCOUNTER — Ambulatory Visit
Admission: RE | Admit: 2019-10-19 | Discharge: 2019-10-19 | Disposition: A | Discharge status: Home / Self Care | Payer: Medicare PPO | Source: Ambulatory Visit | Attending: Nurse Practitioner | Admitting: Nurse Practitioner

## 2019-10-19 ENCOUNTER — Other Ambulatory Visit: Payer: Self-pay | Admitting: Nurse Practitioner

## 2019-10-19 DIAGNOSIS — R921 Mammographic calcification found on diagnostic imaging of breast: Secondary | ICD-10-CM

## 2019-11-27 ENCOUNTER — Encounter: Payer: Self-pay | Admitting: Student in an Organized Health Care Education/Training Program

## 2019-11-28 ENCOUNTER — Other Ambulatory Visit: Payer: Self-pay

## 2019-11-28 ENCOUNTER — Encounter: Payer: Self-pay | Admitting: Student in an Organized Health Care Education/Training Program

## 2019-11-28 ENCOUNTER — Ambulatory Visit
Payer: Medicare PPO | Attending: Student in an Organized Health Care Education/Training Program | Admitting: Student in an Organized Health Care Education/Training Program

## 2019-11-28 DIAGNOSIS — G7111 Myotonic muscular dystrophy: Secondary | ICD-10-CM

## 2019-11-28 DIAGNOSIS — M47817 Spondylosis without myelopathy or radiculopathy, lumbosacral region: Secondary | ICD-10-CM

## 2019-11-28 DIAGNOSIS — E7402 Pompe disease: Secondary | ICD-10-CM | POA: Diagnosis not present

## 2019-11-28 DIAGNOSIS — M5416 Radiculopathy, lumbar region: Secondary | ICD-10-CM

## 2019-11-28 DIAGNOSIS — G894 Chronic pain syndrome: Secondary | ICD-10-CM

## 2019-11-28 DIAGNOSIS — M5030 Other cervical disc degeneration, unspecified cervical region: Secondary | ICD-10-CM

## 2019-11-28 DIAGNOSIS — M5412 Radiculopathy, cervical region: Secondary | ICD-10-CM

## 2019-11-28 NOTE — Progress Notes (Signed)
Patient: Vickie Stevens  Service Category: E/M  Provider: Gillis Santa, MD  DOB: [birth year]  DOS: 11/28/2019  Location: Office  MRN: [id number]  Setting: Ambulatory outpatient  Referring Provider: Jodi Marble, MD  Type: Established Patient  Specialty: Interventional Pain Management  PCP: Jodi Marble, MD  Location: Home  Delivery: TeleHealth     Virtual Encounter - Pain Management PROVIDER NOTE: Information contained herein reflects review and annotations entered in association with encounter. Interpretation of such information and data should be left to medically-trained personnel. Information provided to patient can be located elsewhere in the medical record under "Patient Instructions". Document created using STT-dictation technology, any transcriptional errors that may result from process are unintentional.    Contact & Pharmacy Preferred: [phone] Home: [phone] (home) Mobile: [phone] (mobile) E-mail: [email or web address]  [facility], [place] [address] Phone: [phone] Fax: [phone]  [facility] [place] [address] [place] [address] Phone: [phone] Fax: [phone]  [id number] [place] [address] [place] [address]: [phone]: [phone] [id number] [place] [phone]: [phone]: [phone] Pre-screening  Ms. Wearing offered "in-person" vs "virtual" encounter. She indicated preferring virtual for this encounter.   Reason COVID-19*  Social distancing based on CDC and AMA recommendations.   I contacted EiDeveron Furlongn 11/28/2019 via video conference.      I clearly identified myself as BiGillis SantaMD. I verified that I was speaking with the correct person using two  identifiers (Name: EiBERNARDINA CACHOand date of birth: 7/Mar 09, 1971  Consent I sought verbal advanced consent from EiDeveron Furlongor virtual visit interactions. I informed Ms. KhMuchaf possible security and privacy concerns, risks, and limitations associated with providing "not-in-person" medical evaluation and management services. I also informed Ms. KhRydenf the availability of "in-person" appointments. Finally, I informed her that there would be a charge for the virtual visit and that she could be  personally, fully or partially, financially responsible for it. Ms. KhRathbunxpressed understanding and agreed to proceed.   Historic Elements   Ms. EiHILARIE SINHAs a 89.o. year old, female patient evaluated today after her last contact with our practice on 10/17/2019. Ms. KhPascualhas a past medical history of ADHD (attention deficit hyperactivity disorder), Biceps tendonitis, Bipolar disorder (HCKnox Chronic back pain, Cluster headache, Crohn's disease (HCA[place] DDD (degenerative disc disease), lumbar, Depression, Diabetes mellitus without complication (HC[place] Hypercholesteremia, Hypertension, PCOS (polycystic ovarian syndrome), Rotator cuff tendinitis, Sleep apnea, and Tuberous sclerosis (HCCrosby She also  has a past surgical history that includes Endometrial ablation (01-2011); Shoulder surgery (Left, 07-1999, 07-2011); Cataract extraction, bilateral; Esophagogastroduodenoscopy (egd) with propofol (N/A, 06/08/2018); and Colonoscopy with propofol (N/A, 06/08/2018). Ms. KhLendermanas a current medication list which includes the following prescription(s): amlodipine, atorvastatin, belbuca, cholecalciferol, dextran 70-hypromellose (pf), fluoxetine, linaclotide, loratadine, metformin, montelukast, narcan, olmesartan, and quetiapine fumarate, and the following Facility-Administered Medications: bupivacaine, bupivacaine (pf), lactated ringers, orphenadrine, and triamcinolone acetonide. She  reports that she has never  smoked. She has never used smokeless tobacco. She reports that she does not drink alcohol or use drugs. Ms. KhIvies allergic to phenergan [promethazine hcl]; codeine; cortisone; lidocaine; and oxycodone.   HPI  Today, she is being contacted for a  post-procedure assessment.  Evaluation of last interventional procedure  10/16/2019 Procedure:   Type: Therapeutic Inter-Laminar Epidural Steroid Injection  #1  Region: Lumbar Level: L4-5 Level. Laterality: Midline      Sedation: Please see nurses note for DOS. When no sedatives are used, the analgesic levels obtained are directly associated to the effectiveness of the local anesthetics. However, when sedation is provided, the level of analgesia obtained during the initial 1 hour following the intervention, is believed to be the result of a combination of factors. These factors may include, but are not limited to: 1. The effectiveness of the local anesthetics used. 2. The effects of the analgesic(s) and/or anxiolytic(s) used. 3. The degree of discomfort experienced by the patient at the time of the procedure. 4. The patients ability and reliability in recalling and recording the events. 5. The presence and influence of possible secondary gains and/or psychosocial factors. Reported result: Relief experienced during the 1st hour after the procedure: 80 % (Ultra-Short Term Relief)            Interpretative annotation: Clinically appropriate result. Analgesia during this period is likely to be Local Anesthetic and/or IV Sedative (Analgesic/Anxiolytic) related.          Effects of local anesthetic: The analgesic effects attained during this period are directly associated to the localized infiltration of local anesthetics and therefore cary significant diagnostic value as to the etiological location, or anatomical origin, of the pain. Expected duration of relief is directly dependent on the pharmacodynamics of the local anesthetic used. Long-acting (4-6  hours) anesthetics used.  Reported result: Relief during the next 4 to 6 hour after the procedure: 80 % (Short-Term Relief)            Interpretative annotation: Clinically appropriate result. Analgesia during this period is likely to be Local Anesthetic-related.          Long-term benefit: Defined as the period of time past the expected duration of local anesthetics (1 hour for short-acting and 4-6 hours for long-acting). With the possible exception of prolonged sympathetic blockade from the local anesthetics, benefits during this period are typically attributed to, or associated with, other factors such as analgesic sensory neuropraxia, antiinflammatory effects, or beneficial biochemical changes provided by agents other than the local anesthetics.  Reported result: Extended relief following procedure: 25 %-40% (Long-Term Relief)            Interpretative annotation: Clinically appropriate result. Good relief. No permanent benefit expected. Inflammation plays a part in the etiology to the pain.          UDS:  Summary  Date Value Ref Range Status  06/28/2019 Note  Final    Comment:    ==================================================================== [employer] Select 13 (MW) ==================================================================== Test                             Result       Flag       Units Drug Present not Declared for Prescription Verification   Buprenorphine                  13           UNEXPECTED ng/mg creat   Norbuprenorphine               21           UNEXPECTED ng/mg creat    Source of buprenorphine is a scheduled prescription medication.    Norbuprenorphine  is an expected metabolite of buprenorphine. ==================================================================== Test                      Result    Flag   Units      Ref Range   Creatinine              82               mg/dL      >=20 ==================================================================== Declared  Medications:  The flagging and interpretation on this report are based on the  following declared medications.  Unexpected results may arise from  inaccuracies in the declared medications.  **Note: The testing scope of this panel does not include the  following reported medications:  Atorvastatin  Chlorthalidone  Eye Drop  Fluoxetine  Linaclotide  Lisinopril  Loratadine  Metformin  Montelukast  Naloxone  Quetiapine ==================================================================== For clinical consultation, please call [phone]. ====================================================================    Laboratory Chemistry Profile   Renal Lab Results  Component Value Date   BUN 22 (H) 12/08/2017   CREATININE 0.60 12/08/2017   GFRAA >60 12/08/2017   GFRNONAA >60 12/08/2017     Hepatic Lab Results  Component Value Date   AST 18 12/08/2017   ALT 14 12/08/2017   ALBUMIN 3.3 (L) 12/08/2017   ALKPHOS 75 12/08/2017     Electrolytes Lab Results  Component Value Date   NA 139 12/08/2017   K 3.1 (L) 12/08/2017   CL 103 12/08/2017   CALCIUM 8.9 12/08/2017     Bone No results found for: VD25OH, VD125OH2TOT, IE3329JJ8, AC1660YT0, 25OHVITD1, 25OHVITD2, 25OHVITD3, TESTOFREE, TESTOSTERONE   Inflammation (CRP: Acute Phase) (ESR: Chronic Phase) No results found for: CRP, ESRSEDRATE, LATICACIDVEN     Note: Above Lab results reviewed.  Imaging  MM DIAG BREAST TOMO BILATERAL CLINICAL DATA:  Patient presents for evaluation of right breast calcifications identified on outside screening mammogram.Patient has multiple prior exams at this facility for comparison.  EXAM: DIGITAL DIAGNOSTIC BILATERAL MAMMOGRAM WITH CAD AND TOMO  COMPARISON:  Previous exam(s).  ACR Breast Density Category c: The breast tissue is heterogeneously dense, which may obscure small masses.  FINDINGS: Stable bilateral breast calcifications and oval circumscribed masses. No new masses,  calcifications or nonsurgical distortion identified within the right or left breast.  Mammographic images were processed with CAD.  IMPRESSION: No mammographic evidence for malignancy.  RECOMMENDATION: Screening mammogram in one year.(Code:[id number]-01Y)  I have discussed the findings and recommendations with the patient. If applicable, a reminder letter will be sent to the patient regarding the next appointment.  BI-RADS CATEGORY  2: Benign.  Electronically Signed   By: Lovey Newcomer M.D.   On: 10/19/2019 16:17  Assessment  The primary encounter diagnosis was Lumbar radiculopathy. 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  Plan of Care   [name] has a current medication list which includes the following long-term medication(s): amlodipine, atorvastatin, fluoxetine, linaclotide, loratadine, montelukast, and olmesartan.    Patient did well after her L4-L5 epidural steroid injection on 10/16/2019.  She states that her pain is significantly less and she is able to function more comfortably.  She continues her belbuca as prescribed.  She is planning on going to [place] to see family.  We will place order for as needed lumbar epidural steroid injection as the patient is  continuing to see benefit after her previous one so no need to do a second 1 at this time.  Patient will follow up in 1 month for medication management.  orders:  Orders Placed This Encounter  Procedures  . Lumbar Epidural Injection    Standing Status:   Standing    Number of Occurrences:   9    Standing Expiration Date:   05/30/2020    Scheduling Instructions:     Purpose: Palliative     Indication: Lower extremity pain/Sciatica unspecified side (M54.30).     Side: Midline     Level: TBD     Sedation:  Patient's choice.     TIMEFRAME: PRN procedure. (Ms. Leggitt will call when needed.)    Order Specific Question:   Where will this procedure be performed?    Answer:   [facility]   Follow-up plan:   Return for Keep sch. appt.     Status post L4-L5 ESI #1 on 10/16/2019    Recent Visits Date Type Provider Dept  10/16/19 Procedure visit Gillis Santa, MD [facility]  10/11/19 Office Visit Gillis Santa, MD [facility]  Showing recent visits within past 90 days and meeting all other requirements   Future Appointments Date Type Provider Dept  01/03/20 Appointment Gillis Santa, MD [facility]  Showing future appointments within next 90 days and meeting all other requirements   I discussed the assessment and treatment plan with the patient. The patient was provided an opportunity to ask questions and all were answered. The patient agreed with the plan and demonstrated an understanding of the instructions.  Patient advised to call back or seek an in-person evaluation if the symptoms or condition worsens.  Duration of encounter 30 minutes.  Note by: Gillis Santa, MD Date: 11/28/2019; Time: 2:10 PM

## 2019-12-13 ENCOUNTER — Telehealth: Payer: Self-pay

## 2019-12-13 NOTE — Telephone Encounter (Signed)
She got her MRI done at [facility] you can go in care everywhere and it's under other results. She wanted you to see it before the procedure.

## 2020-01-02 ENCOUNTER — Ambulatory Visit
Payer: Medicare PPO | Attending: Student in an Organized Health Care Education/Training Program | Admitting: Student in an Organized Health Care Education/Training Program

## 2020-01-02 ENCOUNTER — Encounter: Payer: Self-pay | Admitting: Student in an Organized Health Care Education/Training Program

## 2020-01-02 ENCOUNTER — Other Ambulatory Visit: Payer: Self-pay

## 2020-01-02 VITALS — BP 116/77 | HR 99 | Temp 97.8°F | Resp 18 | Ht 69.0 in | Wt 235.0 lb

## 2020-01-02 DIAGNOSIS — M5030 Other cervical disc degeneration, unspecified cervical region: Secondary | ICD-10-CM | POA: Insufficient documentation

## 2020-01-02 DIAGNOSIS — M47816 Spondylosis without myelopathy or radiculopathy, lumbar region: Secondary | ICD-10-CM | POA: Diagnosis present

## 2020-01-02 DIAGNOSIS — M48062 Spinal stenosis, lumbar region with neurogenic claudication: Secondary | ICD-10-CM | POA: Insufficient documentation

## 2020-01-02 DIAGNOSIS — E7402 Pompe disease: Secondary | ICD-10-CM | POA: Diagnosis present

## 2020-01-02 DIAGNOSIS — M47817 Spondylosis without myelopathy or radiculopathy, lumbosacral region: Secondary | ICD-10-CM | POA: Diagnosis not present

## 2020-01-02 DIAGNOSIS — G7111 Myotonic muscular dystrophy: Secondary | ICD-10-CM | POA: Diagnosis present

## 2020-01-02 DIAGNOSIS — M5416 Radiculopathy, lumbar region: Secondary | ICD-10-CM | POA: Diagnosis present

## 2020-01-02 DIAGNOSIS — G894 Chronic pain syndrome: Secondary | ICD-10-CM | POA: Insufficient documentation

## 2020-01-02 MED ORDER — BELBUCA 150 MCG BU FILM: 1.0000 | ORAL_FILM | Freq: Two times a day (BID) | BUCCAL | 2 refills | Status: DC

## 2020-01-02 NOTE — Progress Notes (Signed)
Safety precautions to be maintained throughout the outpatient stay will include: orient to surroundings, keep bed in low position, maintain call bell within reach at all times, provide assistance with transfer out of bed and ambulation.  

## 2020-01-02 NOTE — Progress Notes (Signed)
PROVIDER NOTE: Information contained herein reflects review and annotations entered in association with encounter. Interpretation of such information and data should be left to medically-trained personnel. Information provided to patient can be located elsewhere in the medical record under "Patient Instructions". Document created using STT-dictation technology, any transcriptional errors that may result from process are unintentional.    Patient: Vickie Stevens  Service Category: E/M  Provider: Gillis Santa, MD  DOB: [birth year]  DOS: 01/02/2020  Specialty: Interventional Pain Management  MRN: [id number]  Setting: Ambulatory outpatient  PCP: Jodi Marble, MD  Type: Established Patient    Referring Provider: Jodi Marble, MD  Location: Office  Delivery: Face-to-face     HPI  Reason for encounter: Vickie Stevens, a 49 y.o. year old female, is here today for evaluation and management of her Lumbar facet arthropathy [M47.816]. Vickie Stevens primary complain today is Pain (entire left side from shoulder to gluteals), Pain (right side from knees to toes), and Pain (anterior and posterior) Last encounter: Practice (12/13/2019). My last encounter with her was on 10/16/2019. 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 Pain Assessment: Severity of Chronic pain is reported as a 7 /10. Location: Other (Comment) (right and left side of body) Right, Left/ . Onset: More than a month ago. Quality: Aching, Dull, Stabbing. Timing: Constant. Modifying factor(s): medication, aqua therapy, TENS. Vitals:  height is 5' 9" (1.753 m) and weight is 235 lb (106.6 kg). Her temperature is 97.8 F (36.6 C). Her blood pressure is  116/77 and her pulse is 99. Her respiration is 18 and oxygen saturation is 100%.   Patient presents today for medication refill of her belbuca as well as low back pain.  She has completed her lumbar MRI, results of which are below.  She is status post midline L4-L5 ESI on 10/16/2019 which did provide her with symptomatic pain relief and she would like to repeat that.  For her low back pain related to facet arthropathy and lumbar degenerative disc disease, patient has tried lumbar facet medial branch nerve blocks in the past which were not very effective.  We discussed Sprint peripheral nerve stimulation of lumbar medial branch.  Pharmacotherapy Assessment    12/04/2019  2   10/11/2019  Belbuca 150 MCG Film  60.00  30 Bi Lat   [id number]   Nor (0290)   2  0.30 mg  Medicare   [place]      Monitoring: [place] PMP: PDMP reviewed during this encounter.       Pharmacotherapy: No side-effects or adverse reactions reported. Compliance: No problems identified. Effectiveness: Clinically acceptable.  UDS:  Summary  Date Value Ref Range Status  06/28/2019 Note  Final    Comment:    ==================================================================== [employer] Select 13 (MW) ==================================================================== Test                             Result       Flag       Units Drug Present not Declared for Prescription Verification   Buprenorphine                  13           UNEXPECTED ng/mg creat   Norbuprenorphine  21           UNEXPECTED ng/mg creat    Source of buprenorphine is a scheduled prescription medication.    Norbuprenorphine is an expected metabolite of buprenorphine. ==================================================================== Test                      Result    Flag   Units      Ref Range   Creatinine              82               mg/dL      >=20 ==================================================================== Declared Medications:  The  flagging and interpretation on this report are based on the  following declared medications.  Unexpected results may arise from  inaccuracies in the declared medications.  **Note: The testing scope of this panel does not include the  following reported medications:  Atorvastatin  Chlorthalidone  Eye Drop  Fluoxetine  Linaclotide  Lisinopril  Loratadine  Metformin  Montelukast  Naloxone  Quetiapine ==================================================================== For clinical consultation, please call [phone]. ====================================================================       ROS  Constitutional: Denies any fever or chills Gastrointestinal: No reported hemesis, hematochezia, vomiting, or acute GI distress Musculoskeletal: Denies any acute onset joint swelling, redness, loss of ROM, or weakness Neurological: No reported episodes of acute onset apraxia, aphasia, dysarthria, agnosia, amnesia, paralysis, loss of coordination, or loss of consciousness  Medication Review  Buprenorphine HCl, Cholecalciferol, Dextran 70-Hypromellose (PF), FLUoxetine, QUEtiapine Fumarate, amLODipine, atorvastatin, loratadine, metFORMIN, montelukast, and olmesartan  History Review  Allergy: Vickie Stevens is allergic to phenergan [promethazine hcl], codeine, cortisone, lidocaine, and oxycodone. Drug: Vickie Stevens  reports no history of drug use. Alcohol:  reports no history of alcohol use. Tobacco:  reports that she has never smoked. She has never used smokeless tobacco. Social: Vickie Stevens  reports that she has never smoked. She has never used smokeless tobacco. She reports that she does not drink alcohol and does not use drugs. Medical:  has a past medical history of ADHD (attention deficit hyperactivity disorder), Biceps tendonitis, Bipolar disorder ([place]), Chronic back pain, Cluster headache, Crohn's disease (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 Surgical: Ms. Fendrick  has a past surgical history that includes Endometrial ablation (01-2011); Shoulder surgery (Left, 07-1999, 07-2011); Cataract extraction, bilateral; Esophagogastroduodenoscopy (egd) with propofol (N/A, 06/08/2018); and Colonoscopy with propofol (N/A, 06/08/2018). Family: family history includes Diabetes in her father; Hyperlipidemia in her father; Hypertension in her father.  Laboratory Chemistry Profile   Renal Lab Results  Component Value Date   BUN 22 (H) 12/08/2017   CREATININE 0.60 12/08/2017   GFRAA >60 12/08/2017   GFRNONAA >60 12/08/2017     Hepatic Lab Results  Component Value Date   AST 18 12/08/2017   ALT 14 12/08/2017   ALBUMIN 3.3 (L) 12/08/2017   ALKPHOS 75 12/08/2017     Electrolytes Lab Results  Component Value Date   NA 139 12/08/2017   K 3.1 (L) 12/08/2017   CL 103 12/08/2017   CALCIUM 8.9 12/08/2017     Bone No results found for: VD25OH, VD125OH2TOT, [id number], CZ6606TK1, 25OHVITD1, 25OHVITD2, 25OHVITD3, TESTOFREE, TESTOSTERONE   Inflammation (CRP: Acute Phase) (ESR: Chronic Phase) No results found for: CRP, ESRSEDRATE, LATICACIDVEN     Note: Above Lab results reviewed.  Recent Imaging Review  MM DIAG BREAST TOMO  BILATERAL CLINICAL DATA:  Patient presents for evaluation of right breast calcifications identified on outside screening mammogram.Patient has multiple prior exams at this facility for comparison.  EXAM: DIGITAL DIAGNOSTIC BILATERAL MAMMOGRAM WITH CAD AND TOMO  COMPARISON:  Previous exam(s).  ACR Breast Density Category c: The breast tissue is heterogeneously dense, which may obscure small masses.  FINDINGS: Stable bilateral breast calcifications and oval circumscribed masses. No new masses, calcifications or nonsurgical distortion identified within the  right or left breast.  Mammographic images were processed with CAD.  IMPRESSION: No mammographic evidence for malignancy.  RECOMMENDATION: Screening mammogram in one year.(Code:[id number]-B-01Y)  I have discussed the findings and recommendations with the patient. If applicable, a reminder letter will be sent to the patient regarding the next appointment.  BI-RADS CATEGORY  2: Benign.  Electronically Signed   By: Lovey Newcomer M.D.   On: 10/19/2019 16:17 Note: Reviewed         INDICATION: Low back pain with bilateral lower extremity neuropathy..   TECHNIQUE: Axial and sagittal images of the lumbar spine were obtained  using multiple pulse sequences.   CONTRAST DOSE:  None.   COMPARISON: None available.   FINDINGS:    Segmentation: Conventional segmentation.   Conus medullaris: Terminates at L1-L2. No enlargement or signal  abnormality.   Vertebral Column: Minor retrolisthesis L3 on L4 and L4 on L5. Vertebral  body heights are maintained. There is prominent epidural fat deposition  throughout the lumbar spine which contributes to narrowing of the thecal  sac in combination with short pedicles.   L1-L2: No evidence of focal disc herniation or significant spinal/foraminal  stenosis.   L2-L3: No evidence of disc herniation or significant spinal/foraminal  stenosis.   L3-L4: Mild disc desiccation and bulge. There is bulky ligamentous and  facet hypertrophy. There is moderate concentric spinal canal stenosis,  bilateral lateral recess stenosis and bilateral foraminal narrowing.   L4-L5: Disc desiccation and diffuse bulge. There is right greater than left  facet hypertrophy and ligamentous hypertrophy. Small facet joint effusion.  There is moderate spinal canal stenosis with right greater than left  lateral recess stenosis and moderate-advanced bilateral foraminal narrowing  related small right lateral annular fissure.   L5-S1: Mild disc desiccation. No focal herniation.  No significant spinal or  foraminal stenosis.   Additional findings: On the coronal localizer images, there is a masslike  lesion projecting in the right adnexa measuring 3.5 x 3.5 cm, image 5 of  series 2. There is an additional possible mass projecting posterior to the  uterus in close proximity to the rectum measuring 4.4 x 3.6 cm, image 11 of  series 2 and image 8 of series 4. This is of uncertain etiology.   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 Additionally, there is posterior epidural lipomatosis  contributing to narrowing of the thecal sac.   At L3-L4 and L4-L5, there are superimposed disc protrusions and bulky  posterior element hypertrophy contributing to superimposed acquired spinal  and foraminal stenosis. The findings are more pronounced on the right at  L4-L5.   Incidental finding of a 3.5 cm right adnexal mass and possible additional  larger mass adjacent to or involving the rectum.. Further evaluation with  pelvic ultrasound is recommended; if this is inconclusive, then pelvic CT  or MRI is recommended. Unexpected finding reported in EPIC: Pelvic mass    Physical Exam  General appearance: Well nourished, well developed, and well  hydrated. In no apparent acute distress Mental status: Alert, oriented x 3 (person, place, & time)       Respiratory: No evidence of acute respiratory distress Eyes: PERLA Vitals: BP 116/77   Pulse 99   Temp 97.8 F (36.6 C)   Resp 18   Ht 5' 9" (1.753 m)   Wt 235 lb (106.6 kg)   SpO2 100%   BMI 34.70 kg/m  BMI: Estimated body mass index is 34.7 kg/m as calculated from the following:   Height as of this encounter: 5' 9" (1.753 m).   Weight as of this encounter: 235 lb (106.6 kg). Ideal: Ideal body weight: 66.2 kg (145 lb 15.1 oz) Adjusted ideal body weight: 82.4 kg (181 lb 9.1 oz)  Lumbar Spine Area Exam  Skin & Axial Inspection: No masses, redness, or  swelling Alignment: Symmetrical Functional ROM: Decreased ROM       Stability: No instability detected Muscle Tone/Strength: Functionally intact. No obvious neuro-muscular anomalies detected. Sensory (Neurological): Musculoskeletal pain pattern and dermatomal   Palpation: Complains of area being tender to palpation       Provocative Tests: Hyperextension/rotation test: (+) bilaterally for facet joint pain. Lumbar quadrant test (Kemp's test): (+) bilaterally for facet joint pain.  Gait & Posture Assessment  Ambulation: Patient ambulates using a cane Gait: Limited. Using assistive device to ambulate Posture: Difficulty standing up straight, due to pain  Lower Extremity Exam    Side: Right lower extremity  Side: Left lower extremity  Stability: No instability observed          Stability: No instability observed          Skin & Extremity Inspection: Skin color, temperature, and hair growth are WNL. No peripheral edema or cyanosis. No masses, redness, swelling, asymmetry, or associated skin lesions. No contractures.  Skin & Extremity Inspection: Skin color, temperature, and hair growth are WNL. No peripheral edema or cyanosis. No masses, redness, swelling, asymmetry, or associated skin lesions. No contractures.  Functional ROM: Pain restricted ROM for hip joint          Functional ROM: Unrestricted ROM                  Muscle Tone/Strength: Functionally intact. No obvious neuro-muscular anomalies detected.  Muscle Tone/Strength: Functionally intact. No obvious neuro-muscular anomalies detected.  Sensory (Neurological): Unimpaired        Sensory (Neurological): Unimpaired        DTR: Patellar: deferred today Achilles: deferred today Plantar: deferred today  DTR: Patellar: deferred today Achilles: deferred today Plantar: deferred today  Palpation: No palpable anomalies  Palpation: No palpable anomalies    Assessment   Status Diagnosis  Persistent Persistent Persistent 1. Lumbar facet  arthropathy   2. Spinal stenosis, lumbar region, with neurogenic claudication   3. DDD (degenerative disc disease), cervical   4. Lumbar and sacral arthritis   5. Lumbar radiculopathy   6. 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 Chronic pain syndrome      Updated Problems: Problem  Lumbar Facet Arthropathy  Chronic Pain Syndrome  Myotonic Dystrophy ([facility])  Myotonic Muscular Dystrophy ([facility])  Bilateral Occipital Neuralgia  Ddd (Degenerative Disc Disease), Cervical  Rotator cuff (capsule) sprain Tear of rotator cuff  Lumbar and Sacral Arthritis  Pompe Disease ([facility])  Spinal Stenosis, Lumbar Region, With Neurogenic Claudication    Plan of Care   [name] has a current medication list which includes the following long-term medication(s):  amlodipine, atorvastatin, fluoxetine, loratadine, montelukast, and olmesartan.  1.  Refill Belbuca as below. 2.  Repeat L4-L5 epidural steroid injection #2. 3.  Reviewed lumbar MRI in great detail.  Patient has L3-L4 and L4-L5 bulky ligamentous and facet hypertrophy causing moderate concentric spinal canal stenosis right greater than left with advanced bilateral foraminal narrowing.  Patient has tried and failed lumbar facet blocks in the past.  Discussed Sprint peripheral nerve stimulation for low back pain related to facet arthropathy and degenerative changes.  Risks and benefits reviewed and patient would like to proceed. 4.  For lumbar spinal stenosis with neurogenic claudication, can consider mild: Minimally invasive lumbar decompressive surgery.  We will need to measure patient's ligamentum flavum thickness on axial scan on her MRI done in 2021.  Pharmacotherapy (Medications Ordered): Meds ordered this encounter  Medications  . Buprenorphine HCl (BELBUCA) 150 MCG FILM    Sig: Place 1 Film inside cheek every 12 (twelve) hours. For chronic pain syndrome    Dispense:  60 each    Refill:  2    For chronic pain  syndrome   Orders:  Orders Placed This Encounter  Procedures  . Lumbar Epidural Injection    Standing Status:   Future    Standing Expiration Date:   02/01/2020    Scheduling Instructions:     Procedure: Interlaminar Lumbar Epidural Steroid injection (LESI)      L4-L5      Laterality: Midline     Sedation: without     Timeframe: ASAA    Order Specific Question:   Where will this procedure be performed?    Answer:   [facility]  . Peripheral Nerve Stimulation    Standing Status:   Future    Standing Expiration Date:   01/01/2021    Scheduling Instructions:     Left L4 MBB PNS with sedation     1 hr    Order Specific Question:   Where will this procedure be performed?    Answer:   [facility]   Follow-up plan:   Return in about 2 weeks (around 01/16/2020) for L4/5 ESI , without sedation.     Status post L4-L5 ESI #1 on 10/16/2019, repeat.  Plan for Sprint peripheral nerve stimulation.  Consider mild for LSS with [place]     Recent Visits Date Type Provider Dept  10/16/19 Procedure visit Gillis Santa, MD [facility]  10/11/19 Office Visit Gillis Santa, MD [facility]  Showing recent visits within past 90 days and meeting all other requirements Today's Visits Date Type Provider Dept  01/02/20 Office Visit Gillis Santa, MD [facility]  Showing today's visits and meeting all other requirements Future Appointments Date Type Provider Dept  02/19/20 Appointment Gillis Santa, MD [facility]  Showing future appointments within next 90 days and meeting all other requirements  I discussed the assessment and treatment plan with the patient. The patient was provided an opportunity to ask questions and all were answered. The patient agreed with the plan and demonstrated an understanding of the instructions.  Patient advised to call back or seek an in-person evaluation if the symptoms or condition worsens.  Duration of encounter:  31mnutes.  Note by: BGillis Santa MD Date: 01/02/2020; Time: 10:40 AM

## 2020-01-02 NOTE — Patient Instructions (Signed)
Epidural Steroid Injection Patient Information  Description: The epidural space surrounds the nerves as they exit the spinal cord.  In some patients, the nerves can be compressed and inflamed by a bulging disc or a tight spinal canal (spinal stenosis).  By injecting steroids into the epidural space, we can bring irritated nerves into direct contact with a potentially helpful medication.  These steroids act directly on the irritated nerves and can reduce swelling and inflammation which often leads to decreased pain.  Epidural steroids may be injected anywhere along the spine and from the neck to the low back depending upon the location of your pain.   After numbing the skin with local anesthetic (like Novocaine), a small needle is passed into the epidural space slowly.  You may experience a sensation of pressure while this is being done.  The entire block usually last less than 10 minutes.  Conditions which may be treated by epidural steroids:   Low back and leg pain  Neck and arm pain  Spinal stenosis  Post-laminectomy syndrome  Herpes zoster (shingles) pain  Pain from compression fractures  Preparation for the injection:  1. Do not eat any solid food or dairy products within 8 hours of your appointment.  2. You may drink clear liquids up to 3 hours before appointment.  Clear liquids include water, black coffee, juice or soda.  No milk or cream please. 3. You may take your regular medication, including pain medications, with a sip of water before your appointment  Diabetics should hold regular insulin (if taken separately) and take 1/2 normal NPH dos the morning of the procedure.  Carry some sugar containing items with you to your appointment. 4. A driver must accompany you and be prepared to drive you home after your procedure.  5. Bring all your current medications with your. 6. An IV may be inserted and sedation may be given at the discretion of the physician.   7. A blood pressure  cuff, EKG and other monitors will often be applied during the procedure.  Some patients may need to have extra oxygen administered for a short period. 8. You will be asked to provide medical information, including your allergies, prior to the procedure.  We must know immediately if you are taking blood thinners (like Coumadin/Warfarin)  Or if you are allergic to IV iodine contrast (dye). We must know if you could possible be pregnant.  Possible side-effects:  Bleeding from needle site  Infection (rare, may require surgery)  Nerve injury (rare)  Numbness & tingling (temporary)  Difficulty urinating (rare, temporary)  Spinal headache ( a headache worse with upright posture)  Light -headedness (temporary)  Pain at injection site (several days)  Decreased blood pressure (temporary)  Weakness in arm/leg (temporary)  Pressure sensation in back/neck (temporary)  Call if you experience:  Fever/chills associated with headache or increased back/neck pain.  Headache worsened by an upright position.  New onset weakness or numbness of an extremity below the injection site  Hives or difficulty breathing (go to the emergency room)  Inflammation or drainage at the infection site  Severe back/neck pain  Any new symptoms which are concerning to you  Please note:  Although the local anesthetic injected can often make your back or neck feel good for several hours after the injection, the pain will likely return.  It takes 3-7 days for steroids to work in the epidural space.  You may not notice any pain relief for at least that one week.    If effective, we will often do a series of three injections spaced 3-6 weeks apart to maximally decrease your pain.  After the initial series, we generally will wait several months before considering a repeat injection of the same type.  If you have any questions, please call [phone] [facility] 

## 2020-01-03 ENCOUNTER — Encounter: Payer: Medicare PPO | Admitting: Student in an Organized Health Care Education/Training Program

## 2020-02-19 ENCOUNTER — Ambulatory Visit (HOSPITAL_BASED_OUTPATIENT_CLINIC_OR_DEPARTMENT_OTHER): Payer: Medicare PPO | Admitting: Student in an Organized Health Care Education/Training Program

## 2020-02-19 ENCOUNTER — Encounter: Payer: Self-pay | Admitting: Student in an Organized Health Care Education/Training Program

## 2020-02-19 ENCOUNTER — Ambulatory Visit
Admission: RE | Admit: 2020-02-19 | Discharge: 2020-02-19 | Disposition: A | Discharge status: Home / Self Care | Payer: Medicare PPO | Source: Ambulatory Visit | Attending: Student in an Organized Health Care Education/Training Program | Admitting: Student in an Organized Health Care Education/Training Program

## 2020-02-19 ENCOUNTER — Other Ambulatory Visit: Payer: Self-pay

## 2020-02-19 VITALS — BP 118/93 | HR 72 | Temp 97.3°F | Resp 16 | Ht 69.0 in | Wt 230.0 lb

## 2020-02-19 DIAGNOSIS — G894 Chronic pain syndrome: Secondary | ICD-10-CM | POA: Diagnosis present

## 2020-02-19 DIAGNOSIS — G7111 Myotonic muscular dystrophy: Secondary | ICD-10-CM | POA: Diagnosis present

## 2020-02-19 DIAGNOSIS — M47816 Spondylosis without myelopathy or radiculopathy, lumbar region: Secondary | ICD-10-CM

## 2020-02-19 MED ORDER — LIDOCAINE HCL 2 % IJ SOLN
20.0000 mL | Freq: Once | INTRAMUSCULAR | Status: AC
  Administered 2020-02-19: 400 mg

## 2020-02-19 MED ORDER — DIAZEPAM 5 MG PO TABS
ORAL_TABLET | ORAL | Status: AC
  Filled 2020-02-19: qty 1

## 2020-02-19 MED ORDER — LIDOCAINE HCL 2 % IJ SOLN
INTRAMUSCULAR | Status: AC
  Filled 2020-02-19: qty 20

## 2020-02-19 MED ORDER — DIAZEPAM 5 MG PO TABS
5.0000 mg | ORAL_TABLET | Freq: Once | ORAL | Status: AC
  Administered 2020-02-19: 5 mg via ORAL

## 2020-02-19 NOTE — Patient Instructions (Signed)

## 2020-02-19 NOTE — Progress Notes (Signed)
Safety precautions to be maintained throughout the outpatient stay will include: orient to surroundings, keep bed in low position, maintain call bell within reach at all times, provide assistance with transfer out of bed and ambulation.  

## 2020-02-19 NOTE — Progress Notes (Signed)
PROVIDER NOTE: Information contained herein reflects review and annotations entered in association with encounter. Interpretation of such information and data should be left to medically-trained personnel. Information provided to patient can be located elsewhere in the medical record under "Patient Instructions". Document created using STT-dictation technology, any transcriptional errors that may result from process are unintentional.    Patient: Vickie Stevens  Service Category: Procedure  Provider: Gillis Santa, MD  DOB: [birth year]  DOS: 02/19/2020  Location: [facility]  MRN: [id number]  Setting: Ambulatory - outpatient  Referring Provider: Jodi Marble, MD  Type: Established Patient  Specialty: Interventional Pain Management  PCP: Jodi Marble, MD   Primary Reason for Visit: Interventional Pain Management Treatment. CC: low back pain  Procedure:          Anesthesia, Analgesia, Anxiolysis:  Left L4 lumbar medial branch peripheral nerve stimulation (SPRINT) for low back pain related to lumbar facet arthropathy, myotonic muscular dystrophy, chronic pain syndrome   Type: Local Anesthesia p.o. Valium 5 mg  Local Anesthetic: Lidocaine 1-2%  Position: Prone   Indications: 1. Lumbar facet arthropathy   2. 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 Chronic pain syndrome    Pain Score: Pre-procedure: 7 /10 Post-procedure: 7  (PNS not on at the time)/10   Pre-op Assessment:  Vickie Stevens is a 49 y.o. (year old), female patient, seen today for interventional treatment. She  has a past surgical history that includes Endometrial ablation (01-2011); Shoulder surgery (Left, 07-1999, 07-2011); Cataract extraction, bilateral; Esophagogastroduodenoscopy (egd) with propofol (N/A, 06/08/2018); and Colonoscopy with propofol (N/A, 06/08/2018). Vickie Stevens has a current medication list which includes the following prescription(s): amlodipine, atorvastatin, belbuca, cholecalciferol, dextran  70-hypromellose (pf), fluoxetine, loratadine, metformin, montelukast, olmesartan, and quetiapine fumarate, and the following Facility-Administered Medications: bupivacaine, bupivacaine (pf), lactated ringers, orphenadrine, and triamcinolone acetonide. Her primarily concern today is the Back Pain  Initial Vital Signs:  Pulse/HCG Rate: 68  Temp: (!) 97.3 F (36.3 C) Resp: 15 BP: 113/70 SpO2: 100 %  BMI: Estimated body mass index is 33.97 kg/m as calculated from the following:   Height as of this encounter: 5' 9"  (1.753 m).   Weight as of this encounter: 230 lb (104.3 kg).  Risk Assessment: Allergies: Reviewed. She is allergic to phenergan [promethazine hcl], codeine, cortisone, lidocaine, and oxycodone.  Allergy Precautions: None required Coagulopathies: Reviewed. None identified.  Blood-thinner therapy: None at this time Active Infection(s): Reviewed. None identified. Vickie Stevens is afebrile  Site Confirmation: Vickie Stevens was asked to confirm the procedure and laterality before marking the site Procedure checklist: Completed Consent: Before the procedure and under the influence of no sedative(s), amnesic(s), or anxiolytics, the patient was informed of the treatment options, risks and possible complications. To fulfill our ethical and legal obligations, as recommended by the American Medical Association's Code of Ethics, I have informed the patient of my clinical impression; the nature and purpose of the treatment or procedure; the risks, benefits, and possible complications of the intervention; the alternatives, including doing nothing; the risk(s) and benefit(s) of the alternative treatment(s) or procedure(s); and the risk(s) and benefit(s) of doing nothing. The patient was provided information about the general risks and possible complications associated with the procedure. These may include, but are not limited to: failure to achieve desired goals, infection, bleeding, organ or nerve  damage, allergic reactions, paralysis, and death. In addition, the patient was informed of those risks and complications associated to Spine-related procedures, such as failure to decrease pain; infection (i.e.:  Meningitis, epidural or intraspinal abscess); bleeding (i.e.: epidural hematoma, subarachnoid hemorrhage, or any other type of intraspinal or peri-dural bleeding); organ or nerve damage (i.e.: Any type of peripheral nerve, nerve root, or spinal cord injury) with subsequent damage to sensory, motor, and/or autonomic systems, resulting in permanent pain, numbness, and/or weakness of one or several areas of the body; allergic reactions; (i.e.: anaphylactic reaction); and/or death. Furthermore, the patient was informed of those risks and complications associated with the medications. These include, but are not limited to: allergic reactions (i.e.: anaphylactic or anaphylactoid reaction(s)); adrenal axis suppression; blood sugar elevation that in diabetics may result in ketoacidosis or comma; water retention that in patients with history of congestive heart failure may result in shortness of breath, pulmonary edema, and decompensation with resultant heart failure; weight gain; swelling or edema; medication-induced neural toxicity; particulate matter embolism and blood vessel occlusion with resultant organ, and/or nervous system infarction; and/or aseptic necrosis of one or more joints. Finally, the patient was informed that Medicine is not an exact science; therefore, there is also the possibility of unforeseen or unpredictable risks and/or possible complications that may result in a catastrophic outcome. The patient indicated having understood very clearly. We have given the patient no guarantees and we have made no promises. Enough time was given to the patient to ask questions, all of which were answered to the patient's satisfaction. Vickie Stevens has indicated that she wanted to continue with the  procedure. Attestation: I, the ordering provider, attest that I have discussed with the patient the benefits, risks, side-effects, alternatives, likelihood of achieving goals, and potential problems during recovery for the procedure that I have provided informed consent. Date  Time: 02/19/2020  8:42 AM  Pre-Procedure Preparation:  Monitoring: As per clinic protocol. Respiration, ETCO2, SpO2, BP, heart rate and rhythm monitor placed and checked for adequate function Safety Precautions: Patient was assessed for positional comfort and pressure points before starting the procedure. Time-out: I initiated and conducted the "Time-out" before starting the procedure, as per protocol. The patient was asked to participate by confirming the accuracy of the "Time Out" information. Verification of the correct person, site, and procedure were performed and confirmed by me, the nursing staff, and the patient. "Time-out" conducted as per Joint Commission's Universal Protocol (UP.01.01.01). Time: 0909  Description of Procedure:          Sprint peripheral nerve stimulation of left L4 lumbar medial branch  After the risks, benefits and alternatives were discussed  with the patient and informed consent was obtained, patient  was placed in the prone position and padded to foster comfort.  Prior to delivery of anesthetics, the painful region was carefully  outlined with a marker. Appropriate skin and bony landmarks  were identified, and pertinent vascular structures were located.   The skin overlying the lumbosacral spine was prepped and  draped in sterile fashion. Fluoroscopy was used to identify the spinous process and lamina in the center of the  patient's region of pain. After identifying and marking the intended target along the course of the medial branch nerve,  the skin around the planned entry point and the subcutaneous tissues are injected with local anesthetic.  An introducer needle and stimulating probe were   assembled, inserted and advanced along the intended course of the medial branch nerve as it traverses the lamina medial and inferior to the zygapophyseal joint, taking care to maintain the proper depth of insertion as the introducer is advanced under fluoroscopic. The introducer needle was delivered  to a location in proximity to the nerve.  Multiple stimulation parameters were used to deliver stimulation to the medial branch nerve in concert with stimulating at multiple positions around the nerve. Nerve target  acquisition was confirmed noting generation of paresthesias in the paravertebral regions corresponding to the level being stimulated as well as rhythmic thumping within the multifidi, the  latter being further corroborated via palpation.   Various electrical parameter combinations were tested, and the lead location was adjusted (physically relocated) until the patient  indicated paresthesia or muscle tension overlapping the distribution of the patient's typical region of pain.   The stimulating probe was removed from the introducer and a percutaneous lead was guided through the needle and delivered to a location in similar proximity to the nerve. Final  location was verified with electrical stimulation and documented with fluoroscopy & ultrasound.   The introducer needle was removed, and the exposed end of the percutaneous lead was attached to an external stimulator unit. Various electrical parameter combinations were again  tested until the patient indicated paresthesia or muscle tension  overlapping the distribution of the patient's typical region of pain.   After confirming that lead impedance was in the normal range, the external unit was detached, the needle was removed, and the lead was anchored at the skin.  The lead was threaded into the connector block and electrical continuity and desired patient response was confirmed. The connector block was attached to the external  stimulator  unit.The site was covered with a sterile occlusive dressing and  a fluoroscopic and ultrasound image was taken to document final placement. The patient was observed for stability of vital signs and comfort.      Illustration of the posterior view of the lumbar spine and the posterior neural structures. Laminae of L2 through S1 are labeled. DPRL5, dorsal primary ramus of L5; DPRS1, dorsal primary ramus of S1; DPR3, dorsal primary ramus of L3; FJ, facet (zygapophyseal) joint L3-L4; I, inferior articular process of L4; LB1, lateral branch of dorsal primary ramus of L1; IAB, inferior articular branches from L3 medial branch (supplies L4-L5 facet joint); IBP, intermediate branch plexus; MB3, medial branch of dorsal primary ramus of L3; NR3, third lumbar nerve root; S, superior articular process of L5; SAB, superior articular branches from L4 (supplies L4-5 facet joint also); TP3, transverse process of L3.  Vitals:   02/19/20 0850 02/19/20 0905 02/19/20 0915 02/19/20 0920  BP: 113/70 117/74 122/87 (!) 118/93  Pulse: 68 75 68 72  Resp:  15 16 16   Temp: (!) 97.3 F (36.3 C)     SpO2: 100% (S) 100% 100% 100%  Weight: 230 lb (104.3 kg)     Height: 5' 9"  (1.753 m)        Start Time: 0909 hrs. End Time: 0919 hrs.  Imaging Guidance (Spinal):          Type of Imaging Technique: Fluoroscopy Guidance (Spinal) and ultrasound guidance to confirm multifidus activation Indication(s): Assistance in needle guidance and placement for procedures requiring needle placement in or near specific anatomical locations not easily accessible without such assistance. Exposure Time: Please see nurses notes. Contrast: None used. Fluoroscopic Guidance: I was personally present during the use of fluoroscopy. "Tunnel Vision Technique" used to obtain the best possible view of the target area. Parallax error corrected before commencing the procedure. "Direction-depth-direction" technique used to introduce the needle under  continuous pulsed fluoroscopy. Once target was reached, antero-posterior, oblique, and lateral fluoroscopic projection used confirm needle placement in  all planes. Images permanently stored in EMR. Interpretation: No contrast injected. I personally interpreted the imaging intraoperatively. Adequate needle placement confirmed in multiple planes. Permanent images saved into the patient's record.  Post-operative Assessment:  Post-procedure Vital Signs:  Pulse/HCG Rate: 72  Temp: (!) 97.3 F (36.3 C) Resp: 16 BP: (!) 118/93 SpO2: 100 %  EBL: None  Complications: No immediate post-treatment complications observed by team, or reported by patient.  Note: The patient tolerated the entire procedure well. A repeat set of vitals were taken after the procedure and the patient was kept under observation following institutional policy, for this type of procedure. Post-procedural neurological assessment was performed, showing return to baseline, prior to discharge. The patient was provided with post-procedure discharge instructions, including a section on how to identify potential problems. Should any problems arise concerning this procedure, the patient was given instructions to immediately contact us, at any time, without hesitation. In any case, we plan to contact the patient by telephone for a follow-up status report regarding this interventional procedure.  Comments:  No additional relevant information.  Plan of Care  Orders:  Orders Placed This Encounter  Procedures  . Peripheral Nerve Stimulation    Standing Status:   Future    Standing Expiration Date:   02/18/2021    Scheduling Instructions:     Right lumbar medial branch L4 Sprint PNS    Order Specific Question:   Where will this procedure be performed?    Answer:   [facility]  . DG [facility] C-ARM 1-60 MIN NO REPORT    Intraoperative interpretation by procedural physician at [place].    Standing Status:    Standing    Number of Occurrences:   1    Order Specific Question:   Reason for exam:    Answer:   Assistance in needle guidance and placement for procedures requiring needle placement in or near specific anatomical locations not easily accessible without such assistance.   Medications ordered for procedure: Meds ordered this encounter  Medications  . lidocaine (XYLOCAINE) 2 % (with pres) injection 400 mg  . diazepam (VALIUM) tablet 5 mg   Medications administered: We administered lidocaine and diazepam.  See the medical record for exact dosing, route, and time of administration.  Follow-up plan:   Return in about 2 weeks (around 03/04/2020) for RIGHT Lumbar SPRINT PNS.      Status post L4-L5 ESI #1 on 10/16/2019, repeat.  Left L4 Sprint peripheral nerve stimulation 02/19/2020   Recent Visits Date Type Provider Dept  01/02/20 Office Visit Gillis Santa, MD [facility]  Showing recent visits within past 90 days and meeting all other requirements Today's Visits Date Type Provider Dept  02/19/20 Procedure visit Gillis Santa, MD [facility]  Showing today's visits and meeting all other requirements Future Appointments Date Type Provider Dept  03/04/20 Appointment Gillis Santa, MD [facility]  04/02/20 Appointment Gillis Santa, MD [facility]  Showing future appointments within next 90 days and meeting all other requirements  Disposition: Discharge home  Discharge (Date  Time): 02/19/2020; 1000 hrs.   Primary Care Physician: Jodi Marble, MD Location: [facility] Note by: Gillis Santa, MD Date: 02/19/2020; Time: 10:26 AM  Disclaimer:  Medicine is not an exact science. The only guarantee in medicine is that nothing is guaranteed. It is important to note that the decision to proceed with this intervention was based on the information collected from the patient. The  Data and conclusions were drawn from the  patient's questionnaire, the interview, and the physical examination. Because the information was provided in large part by the patient, it cannot be guaranteed that it has not been purposely or unconsciously manipulated. Every effort has been made to obtain as much relevant data as possible for this evaluation. It is important to note that the conclusions that lead to this procedure are derived in large part from the available data. Always take into account that the treatment will also be dependent on availability of resources and existing treatment guidelines, considered by other Pain Management Practitioners as being common knowledge and practice, at the time of the intervention. For Medico-Legal purposes, it is also important to point out that variation in procedural techniques and pharmacological choices are the acceptable norm. The indications, contraindications, technique, and results of the above procedure should only be interpreted and judged by a Board-Certified Interventional Pain Specialist with extensive familiarity and expertise in the same exact procedure and technique.

## 2020-02-20 ENCOUNTER — Telehealth: Payer: Self-pay

## 2020-02-20 NOTE — Telephone Encounter (Signed)
Called pat PP and she states that she does not feel much different today because she can only use for 6 hours. Pt very positive, instructed to call if needed.

## 2020-02-28 ENCOUNTER — Encounter: Payer: Self-pay | Admitting: Student in an Organized Health Care Education/Training Program

## 2020-03-04 ENCOUNTER — Ambulatory Visit: Payer: Medicare PPO | Admitting: Student in an Organized Health Care Education/Training Program

## 2020-03-05 ENCOUNTER — Telehealth: Payer: Self-pay

## 2020-03-05 NOTE — Telephone Encounter (Signed)
She said her meds requires prior auth and her insurance company told her that they havent received a request from [place].

## 2020-03-05 NOTE — Telephone Encounter (Signed)
Requesting PA for Belbuca with [employer]

## 2020-03-05 NOTE — Telephone Encounter (Signed)
This is for the [employer]. She  Also states she is in the doughnut hole with [facility] and she has already hit her max. She wants to know if there is another medicine besides belbuca

## 2020-03-13 ENCOUNTER — Encounter: Payer: Self-pay | Admitting: Student in an Organized Health Care Education/Training Program

## 2020-03-13 ENCOUNTER — Other Ambulatory Visit: Payer: Self-pay

## 2020-03-13 ENCOUNTER — Ambulatory Visit (HOSPITAL_BASED_OUTPATIENT_CLINIC_OR_DEPARTMENT_OTHER): Payer: Medicare PPO | Admitting: Student in an Organized Health Care Education/Training Program

## 2020-03-13 ENCOUNTER — Ambulatory Visit
Admission: RE | Admit: 2020-03-13 | Discharge: 2020-03-13 | Disposition: A | Discharge status: Home / Self Care | Payer: Medicare PPO | Source: Ambulatory Visit | Attending: Student in an Organized Health Care Education/Training Program | Admitting: Student in an Organized Health Care Education/Training Program

## 2020-03-13 VITALS — BP 112/71 | HR 83 | Temp 97.8°F | Resp 17 | Ht 69.0 in | Wt 230.0 lb

## 2020-03-13 DIAGNOSIS — M47816 Spondylosis without myelopathy or radiculopathy, lumbar region: Secondary | ICD-10-CM | POA: Insufficient documentation

## 2020-03-13 DIAGNOSIS — G894 Chronic pain syndrome: Secondary | ICD-10-CM

## 2020-03-13 DIAGNOSIS — M47817 Spondylosis without myelopathy or radiculopathy, lumbosacral region: Secondary | ICD-10-CM | POA: Diagnosis present

## 2020-03-13 DIAGNOSIS — G7111 Myotonic muscular dystrophy: Secondary | ICD-10-CM | POA: Diagnosis present

## 2020-03-13 MED ORDER — ROPIVACAINE HCL 2 MG/ML IJ SOLN: 9.0000 mL | Freq: Once | INTRAMUSCULAR | Status: DC

## 2020-03-13 MED ORDER — LIDOCAINE HCL 2 % IJ SOLN
20.0000 mL | Freq: Once | INTRAMUSCULAR | Status: AC
  Administered 2020-03-13: 200 mg

## 2020-03-13 MED ORDER — LIDOCAINE HCL 2 % IJ SOLN
INTRAMUSCULAR | Status: AC
  Filled 2020-03-13: qty 10

## 2020-03-13 NOTE — Progress Notes (Signed)
Safety precautions to be maintained throughout the outpatient stay will include: orient to surroundings, keep bed in low position, maintain call bell within reach at all times, provide assistance with transfer out of bed and ambulation.  

## 2020-03-13 NOTE — Progress Notes (Signed)
PROVIDER NOTE: Information contained herein reflects review and annotations entered in association with encounter. Interpretation of such information and data should be left to medically-trained personnel. Information provided to patient can be located elsewhere in the medical record under "Patient Instructions". Document created using STT-dictation technology, any transcriptional errors that may result from process are unintentional.    Patient: Vickie Stevens  Service Category: Procedure  Provider: Gillis Santa, MD  DOB: 02/25/1971  DOS: 03/13/2020  Location: [facility]  MRN: [id number]  Setting: Ambulatory - outpatient  Referring Provider: Jodi Marble, MD  Type: Established Patient  Specialty: Interventional Pain Management  PCP: Jodi Marble, MD   Primary Reason for Visit: Interventional Pain Management Treatment. CC: low back pain  Procedure:          Anesthesia, Analgesia, Anxiolysis:  RIGHT L4 lumbar medial branch peripheral nerve stimulation (SPRINT) for low back pain related to lumbar facet arthropathy, myotonic muscular dystrophy, chronic pain syndrome   Type: Local Anesthesia p.o. Valium 5 mg  Local Anesthetic: Lidocaine 1-2%  Position: Prone   Indications: 1. Lumbar facet arthropathy   2. Lumbar and sacral arthritis   3. 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 Chronic pain syndrome    Pain Score: Pre-procedure: 4 /10 Post-procedure: 4 /10   Patient had left L4 peripheral nerve Sprint stimulation lead placed on 02/19/2020.  She is doing extremely well in regards to her low back pain and functional status.  She states that her pain is significantly less and that she is able to do more.  She is excited to get her right side placed today.  Pre-op Assessment:  Vickie Stevens is a 49 y.o. (year old), female patient, seen today for interventional treatment. She  has a past surgical history that includes Endometrial ablation (01-2011); Shoulder surgery  (Left, 07-1999, 07-2011); Cataract extraction, bilateral; Esophagogastroduodenoscopy (egd) with propofol (N/A, 06/08/2018); and Colonoscopy with propofol (N/A, 06/08/2018). Vickie Stevens has a current medication list which includes the following prescription(s): amlodipine, atorvastatin, belbuca, cholecalciferol, dextran 70-hypromellose (pf), fluoxetine, loratadine, metformin, montelukast, olmesartan, and quetiapine fumarate, and the following Facility-Administered Medications: bupivacaine, bupivacaine (pf), lactated ringers, orphenadrine, ropivacaine (pf) 2 mg/ml (0.2%), and triamcinolone acetonide. Her primarily concern today is the Back Pain (lower)  Initial Vital Signs:  Pulse/HCG Rate: 83ECG Heart Rate: 91 Temp: 97.8 F (36.6 C) Resp: 16 BP: 108/74 SpO2: 100 %  BMI: Estimated body mass index is 33.97 kg/m as calculated from the following:   Height as of this encounter: 5' 9"  (1.753 m).   Weight as of this encounter: 230 lb (104.3 kg).  Risk Assessment: Allergies: Reviewed. She is allergic to phenergan [promethazine hcl], codeine, cortisone, lidocaine, and oxycodone.  Allergy Precautions: None required Coagulopathies: Reviewed. None identified.  Blood-thinner therapy: None at this time Active Infection(s): Reviewed. None identified. Vickie Stevens is afebrile  Site Confirmation: Vickie Stevens was asked to confirm the procedure and laterality before marking the site Procedure checklist: Completed Consent: Before the procedure and under the influence of no sedative(s), amnesic(s), or anxiolytics, the patient was informed of the treatment options, risks and possible complications. To fulfill our ethical and legal obligations, as recommended by the American Medical Association's Code of Ethics, I have informed the patient of my clinical impression; the nature and purpose of the treatment or procedure; the risks, benefits, and possible complications of the intervention; the alternatives, including doing  nothing; the risk(s) and benefit(s) of the alternative treatment(s) or procedure(s); and the risk(s) and benefit(s) of  doing nothing. The patient was provided information about the general risks and possible complications associated with the procedure. These may include, but are not limited to: failure to achieve desired goals, infection, bleeding, organ or nerve damage, allergic reactions, paralysis, and death. In addition, the patient was informed of those risks and complications associated to Spine-related procedures, such as failure to decrease pain; infection (i.e.: Meningitis, epidural or intraspinal abscess); bleeding (i.e.: epidural hematoma, subarachnoid hemorrhage, or any other type of intraspinal or peri-dural bleeding); organ or nerve damage (i.e.: Any type of peripheral nerve, nerve root, or spinal cord injury) with subsequent damage to sensory, motor, and/or autonomic systems, resulting in permanent pain, numbness, and/or weakness of one or several areas of the body; allergic reactions; (i.e.: anaphylactic reaction); and/or death. Furthermore, the patient was informed of those risks and complications associated with the medications. These include, but are not limited to: allergic reactions (i.e.: anaphylactic or anaphylactoid reaction(s)); adrenal axis suppression; blood sugar elevation that in diabetics may result in ketoacidosis or comma; water retention that in patients with history of congestive heart failure may result in shortness of breath, pulmonary edema, and decompensation with resultant heart failure; weight gain; swelling or edema; medication-induced neural toxicity; particulate matter embolism and blood vessel occlusion with resultant organ, and/or nervous system infarction; and/or aseptic necrosis of one or more joints. Finally, the patient was informed that Medicine is not an exact science; therefore, there is also the possibility of unforeseen or unpredictable risks and/or possible  complications that may result in a catastrophic outcome. The patient indicated having understood very clearly. We have given the patient no guarantees and we have made no promises. Enough time was given to the patient to ask questions, all of which were answered to the patient's satisfaction. Ms. Rossel has indicated that she wanted to continue with the procedure. Attestation: I, the ordering provider, attest that I have discussed with the patient the benefits, risks, side-effects, alternatives, likelihood of achieving goals, and potential problems during recovery for the procedure that I have provided informed consent. Date  Time: 03/13/2020  8:38 AM  Pre-Procedure Preparation:  Monitoring: As per clinic protocol. Respiration, ETCO2, SpO2, BP, heart rate and rhythm monitor placed and checked for adequate function Safety Precautions: Patient was assessed for positional comfort and pressure points before starting the procedure. Time-out: I initiated and conducted the "Time-out" before starting the procedure, as per protocol. The patient was asked to participate by confirming the accuracy of the "Time Out" information. Verification of the correct person, site, and procedure were performed and confirmed by me, the nursing staff, and the patient. "Time-out" conducted as per Joint Commission's Universal Protocol (UP.01.01.01). Time: 0916  Description of Procedure:          Sprint peripheral nerve stimulation of RIGHT L4 lumbar medial branch  After the risks, benefits and alternatives were discussed  with the patient and informed consent was obtained, patient  was placed in the prone position and padded to foster comfort.  Prior to delivery of anesthetics, the painful region was carefully  outlined with a marker. Appropriate skin and bony landmarks  were identified, and pertinent vascular structures were located.   The skin overlying the lumbosacral spine was prepped and  draped in sterile fashion.  Fluoroscopy was used to identify the spinous process and lamina in the center of the  patient's region of pain. After identifying and marking the intended target along the course of the medial branch nerve,  the skin around the planned entry  point and the subcutaneous tissues are injected with local anesthetic.  An introducer needle and stimulating probe were  assembled, inserted and advanced along the intended course of the medial branch nerve as it traverses the lamina medial and inferior to the zygapophyseal joint, taking care to maintain the proper depth of insertion as the introducer is advanced under fluoroscopic. The introducer needle was delivered to a location in proximity to the nerve.  Multiple stimulation parameters were used to deliver stimulation to the medial branch nerve in concert with stimulating at multiple positions around the nerve. Nerve target  acquisition was confirmed noting generation of paresthesias in the paravertebral regions corresponding to the level being stimulated as well as rhythmic thumping within the multifidi, the  latter being further corroborated via palpation.   Various electrical parameter combinations were tested, and the lead location was adjusted (physically relocated) until the patient  indicated paresthesia or muscle tension overlapping the distribution of the patient's typical region of pain.   The stimulating probe was removed from the introducer and a percutaneous lead was guided through the needle and delivered to a location in similar proximity to the nerve. Final  location was verified with electrical stimulation and documented with fluoroscopy & ultrasound.   The introducer needle was removed, and the exposed end of the percutaneous lead was attached to an external stimulator unit. Various electrical parameter combinations were again  tested until the patient indicated paresthesia or muscle tension  overlapping the distribution of the patient's  typical region of pain.   After confirming that lead impedance was in the normal range, the external unit was detached, the needle was removed, and the lead was anchored at the skin.  The lead was threaded into the connector block and electrical continuity and desired patient response was confirmed. The connector block was attached to the external  stimulator unit.The site was covered with a sterile occlusive dressing and  a fluoroscopic and ultrasound image was taken to document final placement. The patient was observed for stability of vital signs and comfort.      Illustration of the posterior view of the lumbar spine and the posterior neural structures. Laminae of L2 through S1 are labeled. DPRL5, dorsal primary ramus of L5; DPRS1, dorsal primary ramus of S1; DPR3, dorsal primary ramus of L3; FJ, facet (zygapophyseal) joint L3-L4; I, inferior articular process of L4; LB1, lateral branch of dorsal primary ramus of L1; IAB, inferior articular branches from L3 medial branch (supplies L4-L5 facet joint); IBP, intermediate branch plexus; MB3, medial branch of dorsal primary ramus of L3; NR3, third lumbar nerve root; S, superior articular process of L5; SAB, superior articular branches from L4 (supplies L4-5 facet joint also); TP3, transverse process of L3.  Vitals:   03/13/20 0846 03/13/20 0918 03/13/20 0923 03/13/20 0928  BP: 108/74 118/84 90/62 112/71  Pulse: 83     Resp: 16 16 14 17   Temp: 97.8 F (36.6 C)     TempSrc: Temporal     SpO2: 100% 98% 99% 99%  Weight: 230 lb (104.3 kg)     Height: 5' 9"  (1.753 m)        Start Time: 0916 hrs. End Time: 0927 hrs.  Imaging Guidance (Spinal):          Type of Imaging Technique: Fluoroscopy Guidance (Spinal) and ultrasound guidance to confirm multifidus activation Indication(s): Assistance in needle guidance and placement for procedures requiring needle placement in or near specific anatomical locations not easily accessible without such  assistance. Exposure Time: Please see nurses notes. Contrast: None used. Fluoroscopic Guidance: I was personally present during the use of fluoroscopy. "Tunnel Vision Technique" used to obtain the best possible view of the target area. Parallax error corrected before commencing the procedure. "Direction-depth-direction" technique used to introduce the needle under continuous pulsed fluoroscopy. Once target was reached, antero-posterior, oblique, and lateral fluoroscopic projection used confirm needle placement in all planes. Images permanently stored in EMR. Interpretation: No contrast injected. I personally interpreted the imaging intraoperatively. Adequate needle placement confirmed in multiple planes. Permanent images saved into the patient's record.  Post-operative Assessment:  Post-procedure Vital Signs:  Pulse/HCG Rate: 8380 Temp: 97.8 F (36.6 C) Resp: 17 BP: 112/71 SpO2: 99 %  EBL: None  Complications: No immediate post-treatment complications observed by team, or reported by patient.  Note: The patient tolerated the entire procedure well. A repeat set of vitals were taken after the procedure and the patient was kept under observation following institutional policy, for this type of procedure. Post-procedural neurological assessment was performed, showing return to baseline, prior to discharge. The patient was provided with post-procedure discharge instructions, including a section on how to identify potential problems. Should any problems arise concerning this procedure, the patient was given instructions to immediately contact us, at any time, without hesitation. In any case, we plan to contact the patient by telephone for a follow-up status report regarding this interventional procedure.  Comments:  No additional relevant information.  Plan of Care  Orders:  Orders Placed This Encounter  Procedures  . [facility] C-ARM 1-60 MIN NO REPORT    Intraoperative interpretation by  procedural physician at [facility].    Standing Status:   Standing    Number of Occurrences:   1    Order Specific Question:   Reason for exam:    Answer:   Assistance in needle guidance and placement for procedures requiring needle placement in or near specific anatomical locations not easily accessible without such assistance.   Medications ordered for procedure: Meds ordered this encounter  Medications  . lidocaine (XYLOCAINE) 2 % (with pres) injection 400 mg  . ropivacaine (PF) 2 mg/mL (0.2%) (NAROPIN) injection 9 mL   Medications administered: We administered lidocaine.  See the medical record for exact dosing, route, and time of administration.  Follow-up plan:   Return in about 8 weeks (around 05/08/2020) for SPRINT PNS lead pull (bilateral).      Status post L4-L5 ESI #1 on 10/16/2019, repeat.  Left L4 Sprint peripheral nerve stimulation 02/19/2020, right L4 Sprint peripheral nerve stimulation 03/13/2020   Recent Visits Date Type Provider Dept  02/19/20 Procedure visit Gillis Santa, MD [facility]  01/02/20 Office Visit Gillis Santa, MD [facility]  Showing recent visits within past 90 days and meeting all other requirements Today's Visits Date Type Provider Dept  03/13/20 Procedure visit Gillis Santa, MD [facility]  Showing today's visits and meeting all other requirements Future Appointments Date Type Provider Dept  04/02/20 Appointment Gillis Santa, MD [facility]  05/08/20 Appointment Gillis Santa, MD [facility]  Showing future appointments within next 90 days and meeting all other requirements  Disposition: Discharge home  Discharge (Date  Time): 03/13/2020; 1000 hrs.   Primary Care Physician: Jodi Marble, MD Location: [facility] Note by: Gillis Santa, MD Date: 03/13/2020; Time: 10:19 AM  Disclaimer:  Medicine is not an exact science. The only guarantee in medicine  is that nothing  is guaranteed. It is important to note that the decision to proceed with this intervention was based on the information collected from the patient. The Data and conclusions were drawn from the patient's questionnaire, the interview, and the physical examination. Because the information was provided in large part by the patient, it cannot be guaranteed that it has not been purposely or unconsciously manipulated. Every effort has been made to obtain as much relevant data as possible for this evaluation. It is important to note that the conclusions that lead to this procedure are derived in large part from the available data. Always take into account that the treatment will also be dependent on availability of resources and existing treatment guidelines, considered by other Pain Management Practitioners as being common knowledge and practice, at the time of the intervention. For Medico-Legal purposes, it is also important to point out that variation in procedural techniques and pharmacological choices are the acceptable norm. The indications, contraindications, technique, and results of the above procedure should only be interpreted and judged by a Board-Certified Interventional Pain Specialist with extensive familiarity and expertise in the same exact procedure and technique.

## 2020-03-14 ENCOUNTER — Telehealth: Payer: Self-pay | Admitting: *Deleted

## 2020-03-14 NOTE — Telephone Encounter (Signed)
No problems post procedure. 

## 2020-04-02 ENCOUNTER — Ambulatory Visit
Payer: Medicare PPO | Attending: Student in an Organized Health Care Education/Training Program | Admitting: Student in an Organized Health Care Education/Training Program

## 2020-04-02 ENCOUNTER — Encounter: Payer: Self-pay | Admitting: Student in an Organized Health Care Education/Training Program

## 2020-04-02 ENCOUNTER — Other Ambulatory Visit: Payer: Self-pay

## 2020-04-02 VITALS — BP 106/84 | HR 98 | Temp 97.5°F | Resp 16 | Ht 69.0 in | Wt 230.0 lb

## 2020-04-02 DIAGNOSIS — M5416 Radiculopathy, lumbar region: Secondary | ICD-10-CM

## 2020-04-02 DIAGNOSIS — M48062 Spinal stenosis, lumbar region with neurogenic claudication: Secondary | ICD-10-CM | POA: Diagnosis not present

## 2020-04-02 DIAGNOSIS — M47817 Spondylosis without myelopathy or radiculopathy, lumbosacral region: Secondary | ICD-10-CM

## 2020-04-02 DIAGNOSIS — G7111 Myotonic muscular dystrophy: Secondary | ICD-10-CM | POA: Diagnosis not present

## 2020-04-02 DIAGNOSIS — M47816 Spondylosis without myelopathy or radiculopathy, lumbar region: Secondary | ICD-10-CM

## 2020-04-02 DIAGNOSIS — M5030 Other cervical disc degeneration, unspecified cervical region: Secondary | ICD-10-CM

## 2020-04-02 DIAGNOSIS — G894 Chronic pain syndrome: Secondary | ICD-10-CM

## 2020-04-02 DIAGNOSIS — E7402 Pompe disease: Secondary | ICD-10-CM

## 2020-04-02 MED ORDER — BUPRENORPHINE HCL 150 MCG BU FILM: 1.0000 | ORAL_FILM | Freq: Two times a day (BID) | BUCCAL | 2 refills | Status: AC

## 2020-04-02 NOTE — Progress Notes (Signed)
Safety precautions to be maintained throughout the outpatient stay will include: orient to surroundings, keep bed in low position, maintain call bell within reach at all times, provide assistance with transfer out of bed and ambulation.   Did not bring medications with her today. Instructed to bring even if emptied.

## 2020-04-02 NOTE — Progress Notes (Signed)
PROVIDER NOTE: Information contained herein reflects review and annotations entered in association with encounter. Interpretation of such information and data should be left to medically-trained personnel. Information provided to patient can be located elsewhere in the medical record under "Patient Instructions". Document created using STT-dictation technology, any transcriptional errors that may result from process are unintentional.    Patient: Vickie Stevens  Service Category: E/M  Provider: Gillis Santa, MD  DOB: 1970/07/18  DOS: 04/02/2020  Specialty: Interventional Pain Management  MRN: [id number]  Setting: Ambulatory outpatient  PCP: Jodi Marble, MD  Type: Established Patient    Referring Provider: Jodi Marble, MD  Location: Office  Delivery: Face-to-face     HPI  Reason for encounter: Vickie Stevens, a 49 y.o. year old female, is here today for evaluation and management of her Lumbar facet arthropathy [M47.816]. Vickie Stevens primary complain today is Neck Pain (left shoulder/arm), Hip Pain (left), Knee Pain (left), Hand Pain (right), and Shoulder Pain (right) Last encounter: Practice (03/14/2020). My last encounter with her was on 03/13/2020. 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 Pain Assessment: Severity of Chronic pain is reported as a 7 /10. Location: Neck  /shoulder down arm to hand/finger "I cant use my hand". Onset: More than a month ago. Quality: Dull, Aching, Stabbing. Timing: Constant. Modifying factor(s): tens, heat, medications, water. Vitals:  height is _0  (1.753 m) and weight is 230 lb (104.3 kg). Her temperature is 97.5 F (36.4 C) (abnormal). Her blood pressure is  106/84 and her pulse is 98. Her respiration is 16 and oxygen saturation is 100%.   Returned from [place], is finding relief from [employer] peripheral nerve stimulator that is in place right now.  This is for her low back pain related to myotonic muscular dystrophy, lumbar facet arthropathy.  Patient states that the [employer] peripheral nerve stimulator has been a Higher education careers adviser for her in regards to her low back pain and improving her functional status.  She feels that she would not be able to tolerate her trip to [place] if she did not have her [employer] peripheral nerve stimulator in place. She is here for medication refill of her buprenorphine.  States that it provides analgesic benefit.  She did not bring her belbuca film today. She states that she is interested in having [employer] peripheral nerve stimulation for her left shoulder as well.  We can consider this in the future.  Pharmacotherapy Assessment   Analgesic: Belbuca 150 mcg twice daily  Monitoring: [place] PMP: PDMP reviewed during this encounter.       Pharmacotherapy: No side-effects or adverse reactions reported. Compliance: No problems identified. Effectiveness: Clinically acceptable.  Ignatius Specking, RN  04/02/2020  3:00 PM  Sign when Signing Visit Safety precautions to be maintained throughout the outpatient stay will include: orient to surroundings, keep bed in low position, maintain call bell within reach at all times, provide assistance with transfer out of bed and ambulation.   Did not bring medications with her today. Instructed to bring even if emptied.    UDS:  Summary  Date Value Ref Range Status  06/28/2019 Note  Final    Comment:    ==================================================================== [employer] Select 13 (MW) ==================================================================== Test  Result       Flag       Units Drug Present not Declared for Prescription Verification   Buprenorphine                   13           UNEXPECTED ng/mg creat   Norbuprenorphine               21           UNEXPECTED ng/mg creat    Source of buprenorphine is a scheduled prescription medication.    Norbuprenorphine is an expected metabolite of buprenorphine. ==================================================================== Test                      Result    Flag   Units      Ref Range   Creatinine              82               mg/dL      >=20 ==================================================================== Declared Medications:  The flagging and interpretation on this report are based on the  following declared medications.  Unexpected results may arise from  inaccuracies in the declared medications.  **Note: The testing scope of this panel does not include the  following reported medications:  Atorvastatin  Chlorthalidone  Eye Drop  Fluoxetine  Linaclotide  Lisinopril  Loratadine  Metformin  Montelukast  Naloxone  Quetiapine ==================================================================== For clinical consultation, please call [phone]. ====================================================================      ROS  Constitutional: Denies any fever or chills Gastrointestinal: No reported hemesis, hematochezia, vomiting, or acute GI distress Musculoskeletal: Left shoulder, low back pain Neurological: No reported episodes of acute onset apraxia, aphasia, dysarthria, agnosia, amnesia, paralysis, loss of coordination, or loss of consciousness  Medication Review  Buprenorphine HCl, Cholecalciferol, Dextran 70-Hypromellose (PF), FLUoxetine, QUEtiapine Fumarate, amLODipine, atorvastatin, loratadine, metFORMIN, montelukast, and olmesartan  History Review  Allergy: [name] is allergic to phenergan [promethazine hcl], codeine, cortisone, lidocaine, and oxycodone. Drug: Vickie Stevens  reports no history of drug use. Alcohol:  reports no history of alcohol use. Tobacco:   reports that she has never smoked. She has never used smokeless tobacco. Social: Vickie Stevens  reports that she has never smoked. She has never used smokeless tobacco. She reports that she does not drink alcohol and does not use drugs. 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 Surgical: Ms. Davlin  has a past surgical history that includes Endometrial ablation (01-2011); Shoulder surgery (Left, 07-1999, 07-2011); Cataract extraction, bilateral; Esophagogastroduodenoscopy (egd) with propofol (N/A, 06/08/2018); Colonoscopy with propofol (N/A, 06/08/2018); and SCS 8/ 2021. Family: family history includes Diabetes in her father; Hyperlipidemia in her father; Hypertension in her father.  Laboratory Chemistry Profile   Renal Lab Results  Component Value Date   BUN 22 (H) 12/08/2017   CREATININE 0.60 12/08/2017   GFRAA >60 12/08/2017   GFRNONAA >60 12/08/2017     Hepatic Lab Results  Component Value Date   AST 18 12/08/2017   ALT 14 12/08/2017   ALBUMIN 3.3 (L) 12/08/2017   ALKPHOS 75 12/08/2017     Electrolytes Lab Results  Component Value Date   NA 139 12/08/2017  K 3.1 (L) 12/08/2017   CL 103 12/08/2017   CALCIUM 8.9 12/08/2017     Bone No results found for: VD25OH, VD125OH2TOT, CV8938BO1, BP1025EN2, 25OHVITD1, 25OHVITD2, 25OHVITD3, TESTOFREE, TESTOSTERONE   Inflammation (CRP: Acute Phase) (ESR: Chronic Phase) No results found for: CRP, ESRSEDRATE, LATICACIDVEN     Note: Above Lab results reviewed.   Physical Exam  General appearance: Well nourished, well developed, and well hydrated. In no apparent  acute distress Mental status: Alert, oriented x 3 (person, place, & time)       Respiratory: No evidence of acute respiratory distress Eyes: PERLA Vitals: BP 106/84   Pulse 98   Temp (!) 97.5 F (36.4 C)   Resp 16   Ht _0  (1.753 m)   Wt 230 lb (104.3 kg)   SpO2 100%   BMI 33.97 kg/m  BMI: Estimated body mass index is 33.97 kg/m as calculated from the following:   Height as of this encounter: _1  (1.753 m).   Weight as of this encounter: 230 lb (104.3 kg). Ideal: Ideal body weight: 66.2 kg (145 lb 15.1 oz) Adjusted ideal body weight: 81.5 kg (179 lb 9.1 oz)   Cervical Spine Area Exam  Skin & Axial Inspection: No masses, redness, edema, swelling, or associated skin lesions Alignment: Symmetrical Functional ROM: Unrestricted ROM      Stability: No instability detected Muscle Tone/Strength: Functionally intact. No obvious neuro-muscular anomalies detected. Sensory (Neurological): Unimpaired Palpation: No palpable anomalies             Upper Extremity (UE) Exam    Side: Right upper extremity  Side: Left upper extremity  Skin & Extremity Inspection: Skin color, temperature, and hair growth are WNL. No peripheral edema or cyanosis. No masses, redness, swelling, asymmetry, or associated skin lesions. No contractures.  Skin & Extremity Inspection: Skin color, temperature, and hair growth are WNL. No peripheral edema or cyanosis. No masses, redness, swelling, asymmetry, or associated skin lesions. No contractures.  Functional ROM: Unrestricted ROM          Functional ROM: Pain restricted ROM for shoulder  Muscle Tone/Strength: Functionally intact. No obvious neuro-muscular anomalies detected.  Muscle Tone/Strength: Functionally intact. No obvious neuro-muscular anomalies detected.  Sensory (Neurological): Unimpaired          Sensory (Neurological): Unimpaired          Palpation: No palpable anomalies              Palpation: No palpable anomalies              Provocative Test(s):   Phalen's test: deferred Tinel's test: deferred Apley's scratch test (touch opposite shoulder):  Action 1 (Across chest): deferred Action 2 (Overhead): deferred Action 3 (LB reach): deferred   Provocative Test(s):  Phalen's test: deferred Tinel's test: deferred Apley's scratch test (touch opposite shoulder):  Action 1 (Across chest): Decreased ROM Action 2 (Overhead): Decreased ROM Action 3 (LB reach): Decreased ROM    Lumbar Spine Area Exam  Skin & Axial Inspection: Sprint peripheral nerve stimulator in place.  Bandage overlying leads.  No erythema or drainage noted Alignment: Symmetrical Functional ROM: Improved after treatment       Stability: No instability detected Muscle Tone/Strength: Functionally intact. No obvious neuro-muscular anomalies detected. Sensory (Neurological): Improved  Lower Extremity Exam    Side: Right lower extremity  Side: Left lower extremity  Stability: No instability observed          Stability: No instability observed  Skin & Extremity Inspection: Skin color, temperature, and hair growth are WNL. No peripheral edema or cyanosis. No masses, redness, swelling, asymmetry, or associated skin lesions. No contractures.  Skin & Extremity Inspection: Skin color, temperature, and hair growth are WNL. No peripheral edema or cyanosis. No masses, redness, swelling, asymmetry, or associated skin lesions. No contractures.  Functional ROM: Improved after treatment                  Functional ROM: Improved after treatment                  Muscle Tone/Strength: Functionally intact. No obvious neuro-muscular anomalies detected.  Muscle Tone/Strength: Functionally intact. No obvious neuro-muscular anomalies detected.  Sensory (Neurological): Unimpaired        Sensory (Neurological): Unimpaired        DTR: Patellar: deferred today Achilles: deferred today Plantar: deferred today  DTR: Patellar: deferred today Achilles: deferred today Plantar: deferred today   Palpation: No palpable anomalies  Palpation: No palpable anomalies    Assessment   Status Diagnosis  Controlled Controlled Controlled 1. Lumbar facet arthropathy   2. Lumbar and sacral arthritis   3. 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 Spinal stenosis, lumbar region, with neurogenic claudication   5. DDD (degenerative disc disease), cervical   6. Lumbar radiculopathy   7. 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 Myotonic dystrophy ([place])   9. Chronic pain syndrome      Plan of Care   [name] has a current medication list which includes the following long-term medication(s): amlodipine, atorvastatin, fluoxetine, loratadine, montelukast, and olmesartan.  1.  Continue with [employer] peripheral nerve stimulation bilaterally, of lumbar medial branch nerves for lumbar facet arthropathy.  This is providing the patient with significant pain relief.  Continue to work with [employer] PNS representatives to optimize programming.  Has an appointment in November for bilateral lead pull.  2.  Refill belbuca as below.  No change in dose.  3.  Urine toxicology screen to assess medication compliance monitoring.  Pharmacotherapy (Medications Ordered): Meds ordered this encounter  Medications  . Buprenorphine HCl (BELBUCA) 150 MCG FILM    Sig: Place 1 Film inside cheek every 12 (twelve) hours. For chronic pain syndrome    Dispense:  60 each    Refill:  2    For chronic pain syndrome   Orders:  Orders Placed This Encounter  Procedures  . ToxASSURE Select 13 (MW), Urine    Volume: 30 ml(s). Minimum 3 ml of urine is needed. Document temperature of fresh sample. Indications: Long term (current) use of opiate analgesic [phone])    Order Specific Question:   Release to patient    Answer:   Immediate   Follow-up plan:   Return in about 3 months (around 07/02/2020) for Medication Management, in person.     Status post L4-L5 ESI #1 on 10/16/2019, repeat.  Left L4 [employer] peripheral nerve stimulation  02/19/2020, right L4 [employer] peripheral nerve stimulation 03/13/2020; Future consideration includes left axillary nerve PNS    Recent Visits Date Type Provider Dept  03/13/20 Procedure visit Gillis Santa, MD [facility]  02/19/20 Procedure visit Gillis Santa, MD [facility]  Showing recent visits within past 90 days and meeting all other requirements Today's Visits Date Type Provider Dept  04/02/20 Office Visit Gillis Santa, MD [facility]  Showing today's visits and meeting all other requirements Future Appointments Date Type Provider Dept  05/08/20 Appointment Gillis Santa, MD [facility]  06/20/20 Appointment Gillis Santa, MD [facility]  Showing future appointments within next 90 days and meeting all other requirements  I discussed the assessment and treatment plan with the patient. The patient was provided an opportunity to ask questions and all were answered. The patient agreed with the plan and demonstrated an understanding of the instructions.  Patient advised to call back or seek an in-person evaluation if the symptoms or condition worsens.  Duration of encounter: 30 minutes.  Note by: Gillis Santa, MD Date: 04/02/2020; Time: 3:20 PM

## 2020-04-06 LAB — TOXASSURE SELECT 13 (MW), URINE

## 2020-04-09 ENCOUNTER — Telehealth: Payer: Self-pay | Admitting: Student in an Organized Health Care Education/Training Program

## 2020-04-09 NOTE — Telephone Encounter (Signed)
Vickie Stevens called asking when she was going to be set up to have the lead replaced. She med with Roselyn Reef from [employer] and was told there is a broken lead and it needs replaced. She is supposed to go out of town on the 18th. Please talk with Dr. Holley Raring and let patient know when this can be done. She states she can be here any time.

## 2020-04-11 ENCOUNTER — Other Ambulatory Visit: Payer: Self-pay | Admitting: Student in an Organized Health Care Education/Training Program

## 2020-04-11 DIAGNOSIS — M47816 Spondylosis without myelopathy or radiculopathy, lumbar region: Secondary | ICD-10-CM

## 2020-04-11 DIAGNOSIS — M47817 Spondylosis without myelopathy or radiculopathy, lumbosacral region: Secondary | ICD-10-CM

## 2020-04-11 NOTE — Progress Notes (Signed)
Order placed for repeat [employer] peripheral nerve stimulation of lumbar medial branch at L4 given lead fracture.

## 2020-04-15 ENCOUNTER — Ambulatory Visit: Payer: Medicare PPO | Admitting: Student in an Organized Health Care Education/Training Program

## 2020-04-15 ENCOUNTER — Ambulatory Visit (HOSPITAL_BASED_OUTPATIENT_CLINIC_OR_DEPARTMENT_OTHER): Payer: Medicare PPO | Admitting: Student in an Organized Health Care Education/Training Program

## 2020-04-15 ENCOUNTER — Other Ambulatory Visit: Payer: Self-pay

## 2020-04-15 ENCOUNTER — Encounter: Payer: Self-pay | Admitting: Student in an Organized Health Care Education/Training Program

## 2020-04-15 ENCOUNTER — Ambulatory Visit
Admission: RE | Admit: 2020-04-15 | Discharge: 2020-04-15 | Disposition: A | Discharge status: Home / Self Care | Payer: Medicare PPO | Source: Ambulatory Visit | Attending: Student in an Organized Health Care Education/Training Program | Admitting: Student in an Organized Health Care Education/Training Program

## 2020-04-15 VITALS — BP 126/91 | HR 73 | Temp 97.2°F | Resp 14 | Ht 69.0 in | Wt 230.0 lb

## 2020-04-15 DIAGNOSIS — G894 Chronic pain syndrome: Secondary | ICD-10-CM | POA: Diagnosis present

## 2020-04-15 DIAGNOSIS — M47816 Spondylosis without myelopathy or radiculopathy, lumbar region: Secondary | ICD-10-CM

## 2020-04-15 DIAGNOSIS — M47817 Spondylosis without myelopathy or radiculopathy, lumbosacral region: Secondary | ICD-10-CM | POA: Diagnosis present

## 2020-04-15 MED ORDER — LIDOCAINE HCL 2 % IJ SOLN
INTRAMUSCULAR | Status: AC
  Filled 2020-04-15: qty 10

## 2020-04-15 MED ORDER — LIDOCAINE HCL 2 % IJ SOLN
20.0000 mL | Freq: Once | INTRAMUSCULAR | Status: AC
  Administered 2020-04-15: 200 mg

## 2020-04-15 NOTE — Progress Notes (Signed)
PROVIDER NOTE: Information contained herein reflects review and annotations entered in association with encounter. Interpretation of such information and data should be left to medically-trained personnel. Information provided to patient can be located elsewhere in the medical record under "Patient Instructions". Document created using STT-dictation technology, any transcriptional errors that may result from process are unintentional.    Patient: Vickie Stevens  Service Category: Procedure  Provider: Gillis Santa, MD  DOB: February 11, 1971  DOS: 04/15/2020  Location: [facility]  MRN: [id number]  Setting: Ambulatory - outpatient  Referring Provider: Jodi Marble, MD  Type: Established Patient  Specialty: Interventional Pain Management  PCP: Jodi Marble, MD   Primary Reason for Visit: Interventional Pain Management Treatment. CC: low back pain  Procedure:          Anesthesia, Analgesia, Anxiolysis:  RIGHT L4 lumbar medial branch peripheral nerve stimulation (SPRINT) for low back pain related to lumbar facet arthropathy, myotonic muscular dystrophy, chronic pain syndrome (replacement due to lead fracture).  Procedure done under fluoroscopy and ultrasound guidance.   Type: Local Anesthesia  Local Anesthetic: Lidocaine 1-2%  Position: Prone   Indications: 1. Lumbar facet arthropathy   2. Lumbar and sacral arthritis   3. Chronic pain syndrome    Pain Score: Pre-procedure: 4 /10 Post-procedure: 3/10   Patient presents today for a placement of a right L4 medial branch Sprint peripheral nerve lead secondary to lead fracture sustained on the right side.  Patient has left peripheral nerve stimulator in place which is providing her with significant pain relief and she was also obtaining significant pain relief on her right side until she suddenly stopped experiencing it.  Sprint representative worked up issue and it is related to lead fracture of peripheral nerve lead.  This  lead will be removed and replaced with a new right L4 medial branch PNS lead.  Pre-op Assessment:  Vickie Stevens is a 49 y.o. (year old), female patient, seen today for interventional treatment. She  has a past surgical history that includes Endometrial ablation (01-2011); Shoulder surgery (Left, 07-1999, 07-2011); Cataract extraction, bilateral; Esophagogastroduodenoscopy (egd) with propofol (N/A, 06/08/2018); Colonoscopy with propofol (N/A, 06/08/2018); and SCS 8/ 2021. Vickie Stevens has a current medication list which includes the following prescription(s): amlodipine, atorvastatin, buprenorphine hcl, cholecalciferol, dextran 70-hypromellose (pf), fluoxetine, loratadine, metformin, montelukast, olmesartan, and quetiapine fumarate, and the following Facility-Administered Medications: bupivacaine, bupivacaine (pf), lactated ringers, orphenadrine, and triamcinolone acetonide. Her primarily concern today is the Neck Pain (left) and Shoulder Pain  Initial Vital Signs:  Pulse/HCG Rate: 73ECG Heart Rate: 72 (nsr) Temp: (!) 97.2 F (36.2 C) Resp: 16 BP: 119/86 SpO2: 100 %  BMI: Estimated body mass index is 33.97 kg/m as calculated from the following:   Height as of this encounter: 5' 9"  (1.753 m).   Weight as of this encounter: 230 lb (104.3 kg).  Risk Assessment: Allergies: Reviewed. She is allergic to phenergan [promethazine hcl], codeine, cortisone, lidocaine, and oxycodone.  Allergy Precautions: None required Coagulopathies: Reviewed. None identified.  Blood-thinner therapy: None at this time Active Infection(s): Reviewed. None identified. Vickie Stevens is afebrile  Site Confirmation: Vickie Stevens was asked to confirm the procedure and laterality before marking the site Procedure checklist: Completed Consent: Before the procedure and under the influence of no sedative(s), amnesic(s), or anxiolytics, the patient was informed of the treatment options, risks and possible complications. To fulfill our  ethical and legal obligations, as recommended by the American Medical Association's Code of Ethics, I have informed the patient  of my clinical impression; the nature and purpose of the treatment or procedure; the risks, benefits, and possible complications of the intervention; the alternatives, including doing nothing; the risk(s) and benefit(s) of the alternative treatment(s) or procedure(s); and the risk(s) and benefit(s) of doing nothing. The patient was provided information about the general risks and possible complications associated with the procedure. These may include, but are not limited to: failure to achieve desired goals, infection, bleeding, organ or nerve damage, allergic reactions, paralysis, and death. In addition, the patient was informed of those risks and complications associated to Spine-related procedures, such as failure to decrease pain; infection (i.e.: Meningitis, epidural or intraspinal abscess); bleeding (i.e.: epidural hematoma, subarachnoid hemorrhage, or any other type of intraspinal or peri-dural bleeding); organ or nerve damage (i.e.: Any type of peripheral nerve, nerve root, or spinal cord injury) with subsequent damage to sensory, motor, and/or autonomic systems, resulting in permanent pain, numbness, and/or weakness of one or several areas of the body; allergic reactions; (i.e.: anaphylactic reaction); and/or death. Furthermore, the patient was informed of those risks and complications associated with the medications. These include, but are not limited to: allergic reactions (i.e.: anaphylactic or anaphylactoid reaction(s)); adrenal axis suppression; blood sugar elevation that in diabetics may result in ketoacidosis or comma; water retention that in patients with history of congestive heart failure may result in shortness of breath, pulmonary edema, and decompensation with resultant heart failure; weight gain; swelling or edema; medication-induced neural toxicity; particulate  matter embolism and blood vessel occlusion with resultant organ, and/or nervous system infarction; and/or aseptic necrosis of one or more joints. Finally, the patient was informed that Medicine is not an exact science; therefore, there is also the possibility of unforeseen or unpredictable risks and/or possible complications that may result in a catastrophic outcome. The patient indicated having understood very clearly. We have given the patient no guarantees and we have made no promises. Enough time was given to the patient to ask questions, all of which were answered to the patient's satisfaction. Vickie Stevens has indicated that she wanted to continue with the procedure. Attestation: I, the ordering provider, attest that I have discussed with the patient the benefits, risks, side-effects, alternatives, likelihood of achieving goals, and potential problems during recovery for the procedure that I have provided informed consent. Date  Time: 04/15/2020  7:56 AM  Pre-Procedure Preparation:  Monitoring: As per clinic protocol. Respiration, ETCO2, SpO2, BP, heart rate and rhythm monitor placed and checked for adequate function Safety Precautions: Patient was assessed for positional comfort and pressure points before starting the procedure. Time-out: I initiated and conducted the "Time-out" before starting the procedure, as per protocol. The patient was asked to participate by confirming the accuracy of the "Time Out" information. Verification of the correct person, site, and procedure were performed and confirmed by me, the nursing staff, and the patient. "Time-out" conducted as per Joint Commission's Universal Protocol (UP.01.01.01). Time: 7262  Description of Procedure:           Right L4 medial branch PNS lead was removed under fluoroscopy secondary to lead fracture..  Afterwards, new right L4 medial branch Sprint peripheral nerve stimulation lead was placed, see procedure note below.  Sprint  peripheral nerve stimulation of RIGHT L4 lumbar medial branch  After the risks, benefits and alternatives were discussed  with the patient and informed consent was obtained, patient  was placed in the prone position and padded to foster comfort.  Prior to delivery of anesthetics, the painful region was carefully  outlined with a marker. Appropriate skin and bony landmarks  were identified, and pertinent vascular structures were located.   The skin overlying the lumbosacral spine was prepped and  draped in sterile fashion. Fluoroscopy was used to identify the spinous process and lamina in the center of the  patient's region of pain. After identifying and marking the intended target along the course of the medial branch nerve,  the skin around the planned entry point and the subcutaneous tissues are injected with local anesthetic.  An introducer needle and stimulating probe were  assembled, inserted and advanced along the intended course of the medial branch nerve as it traverses the lamina medial and inferior to the zygapophyseal joint, taking care to maintain the proper depth of insertion as the introducer is advanced under fluoroscopic. The introducer needle was delivered to a location in proximity to the nerve.  Multiple stimulation parameters were used to deliver stimulation to the medial branch nerve in concert with stimulating at multiple positions around the nerve. Nerve target  acquisition was confirmed noting generation of paresthesias in the paravertebral regions corresponding to the level being stimulated as well as rhythmic thumping within the multifidi, the  latter being further corroborated via palpation.   Various electrical parameter combinations were tested, and the lead location was adjusted (physically relocated) until the patient  indicated paresthesia or muscle tension overlapping the distribution of the patient's typical region of pain.   The stimulating probe was removed from  the introducer and a percutaneous lead was guided through the needle and delivered to a location in similar proximity to the nerve. Final  location was verified with electrical stimulation and documented with fluoroscopy & ultrasound.   The introducer needle was removed, and the exposed end of the percutaneous lead was attached to an external stimulator unit. Various electrical parameter combinations were again  tested until the patient indicated paresthesia or muscle tension  overlapping the distribution of the patient's typical region of pain.   After confirming that lead impedance was in the normal range, the external unit was detached, the needle was removed, and the lead was anchored at the skin.  The lead was threaded into the connector block and electrical continuity and desired patient response was confirmed. The connector block was attached to the external  stimulator unit.The site was covered with a sterile occlusive dressing and  a fluoroscopic and ultrasound image was taken to document final placement. The patient was observed for stability of vital signs and comfort.      Illustration of the posterior view of the lumbar spine and the posterior neural structures. Laminae of L2 through S1 are labeled. DPRL5, dorsal primary ramus of L5; DPRS1, dorsal primary ramus of S1; DPR3, dorsal primary ramus of L3; FJ, facet (zygapophyseal) joint L3-L4; I, inferior articular process of L4; LB1, lateral branch of dorsal primary ramus of L1; IAB, inferior articular branches from L3 medial branch (supplies L4-L5 facet joint); IBP, intermediate branch plexus; MB3, medial branch of dorsal primary ramus of L3; NR3, third lumbar nerve root; S, superior articular process of L5; SAB, superior articular branches from L4 (supplies L4-5 facet joint also); TP3, transverse process of L3.  Vitals:   04/15/20 0838 04/15/20 0843 04/15/20 0848 04/15/20 0851  BP: (!) 112/95 133/90 (!) 126/91   Pulse:      Resp: 14  14 14  (P) 16  Temp:      TempSrc:      SpO2: 100% 100% 100% (P) 99%  Weight:  Height:         Start Time: 0836 hrs. End Time: 0850 hrs.  Imaging Guidance (Spinal):          Type of Imaging Technique: Fluoroscopy Guidance (Spinal) and ultrasound guidance to confirm multifidus activation of lumbar paraspinal muscle. Indication(s): Assistance in needle guidance and placement for procedures requiring needle placement in or near specific anatomical locations not easily accessible without such assistance. Exposure Time: Please see nurses notes. Contrast: None used. Fluoroscopic Guidance: I was personally present during the use of fluoroscopy. "Tunnel Vision Technique" used to obtain the best possible view of the target area. Parallax error corrected before commencing the procedure. "Direction-depth-direction" technique used to introduce the needle under continuous pulsed fluoroscopy. Once target was reached, antero-posterior, oblique, and lateral fluoroscopic projection used confirm needle placement in all planes. Images permanently stored in EMR. Interpretation: No contrast injected. I personally interpreted the imaging intraoperatively. Adequate needle placement confirmed in multiple planes. Permanent images saved into the patient's record. Ultrasound guidance showed contraction of the multifidus muscle with verbal confirmation of stimulation by patient Post-operative Assessment:  Post-procedure Vital Signs:  Pulse/HCG Rate: 73(P) 64 Temp: (!) 97.2 F (36.2 C) Resp: (P) 16 BP: (!) 126/91 SpO2: (P) 99 %  EBL: None  Complications: No immediate post-treatment complications observed by team, or reported by patient.  Note: The patient tolerated the entire procedure well. A repeat set of vitals were taken after the procedure and the patient was kept under observation following institutional policy, for this type of procedure. Post-procedural neurological assessment was performed, showing  return to baseline, prior to discharge. The patient was provided with post-procedure discharge instructions, including a section on how to identify potential problems. Should any problems arise concerning this procedure, the patient was given instructions to immediately contact us, at any time, without hesitation. In any case, we plan to contact the patient by telephone for a follow-up status report regarding this interventional procedure.  Comments:  No additional relevant information.  Plan of Care  Orders:  Orders Placed This Encounter  Procedures  . [facility] C-ARM 1-60 MIN NO REPORT    Intraoperative interpretation by procedural physician at [facility].    Standing Status:   Standing    Number of Occurrences:   1    Order Specific Question:   Reason for exam:    Answer:   Assistance in needle guidance and placement for procedures requiring needle placement in or near specific anatomical locations not easily accessible without such assistance.   Medications ordered for procedure: Meds ordered this encounter  Medications  . lidocaine (XYLOCAINE) 2 % (with pres) injection 400 mg   Medications administered: We administered lidocaine.  See the medical record for exact dosing, route, and time of administration.  Follow-up plan:   Return for Keep sch. appt for lead removal.      Status post L4-L5 ESI #1 on 10/16/2019, repeat.  Left L4 Sprint peripheral nerve stimulation 02/19/2020, right L4 Sprint peripheral nerve stimulation 03/13/2020, lead fracture, replaced 04/15/2020.   Recent Visits Date Type Provider Dept  04/02/20 Office Visit Gillis Santa, MD [facility]  03/13/20 Procedure visit Gillis Santa, MD [facility]  02/19/20 Procedure visit Gillis Santa, MD [facility]  Showing recent visits within past 90 days and meeting all other requirements Today's Visits Date Type Provider Dept  04/15/20 Procedure visit Gillis Santa, MD [facility]  Showing today's visits and meeting all other requirements Future Appointments Date  Type Provider Dept  05/08/20 Appointment Gillis Santa, MD [facility]  06/20/20 Appointment Gillis Santa, MD [facility]  Showing future appointments within next 90 days and meeting all other requirements  Disposition: Discharge home  Discharge (Date  Time): 04/15/2020;   hrs.   Primary Care Physician: Jodi Marble, MD Location: [facility] Note by: Gillis Santa, MD Date: 04/15/2020; Time: 9:40 AM  Disclaimer:  Medicine is not an exact science. The only guarantee in medicine is that nothing is guaranteed. It is important to note that the decision to proceed with this intervention was based on the information collected from the patient. The Data and conclusions were drawn from the patient's questionnaire, the interview, and the physical examination. Because the information was provided in large part by the patient, it cannot be guaranteed that it has not been purposely or unconsciously manipulated. Every effort has been made to obtain as much relevant data as possible for this evaluation. It is important to note that the conclusions that lead to this procedure are derived in large part from the available data. Always take into account that the treatment will also be dependent on availability of resources and existing treatment guidelines, considered by other Pain Management Practitioners as being Stevens knowledge and practice, at the time of the intervention. For Medico-Legal purposes, it is also important to point out that variation in procedural techniques and pharmacological choices are the acceptable norm. The indications, contraindications, technique, and results of the above procedure should only be interpreted and judged by a Board-Certified Interventional Pain Specialist with extensive familiarity and expertise in the same exact procedure  and technique.

## 2020-04-15 NOTE — Progress Notes (Signed)
Safety precautions to be maintained throughout the outpatient stay will include: orient to surroundings, keep bed in low position, maintain call bell within reach at all times, provide assistance with transfer out of bed and ambulation.  

## 2020-04-16 ENCOUNTER — Telehealth: Payer: Self-pay | Admitting: *Deleted

## 2020-04-16 NOTE — Telephone Encounter (Signed)
No problems post procedure. 

## 2020-05-08 ENCOUNTER — Encounter: Payer: Self-pay | Admitting: Student in an Organized Health Care Education/Training Program

## 2020-05-08 ENCOUNTER — Other Ambulatory Visit: Payer: Self-pay

## 2020-05-08 ENCOUNTER — Ambulatory Visit
Payer: Medicare PPO | Attending: Student in an Organized Health Care Education/Training Program | Admitting: Student in an Organized Health Care Education/Training Program

## 2020-05-08 VITALS — BP 116/76 | HR 87 | Temp 97.0°F | Resp 18 | Wt 230.0 lb

## 2020-05-08 DIAGNOSIS — M47816 Spondylosis without myelopathy or radiculopathy, lumbar region: Secondary | ICD-10-CM | POA: Diagnosis present

## 2020-05-08 DIAGNOSIS — G894 Chronic pain syndrome: Secondary | ICD-10-CM | POA: Diagnosis present

## 2020-05-08 DIAGNOSIS — E7402 Pompe disease: Secondary | ICD-10-CM | POA: Insufficient documentation

## 2020-05-08 DIAGNOSIS — M48062 Spinal stenosis, lumbar region with neurogenic claudication: Secondary | ICD-10-CM | POA: Insufficient documentation

## 2020-05-08 DIAGNOSIS — M47817 Spondylosis without myelopathy or radiculopathy, lumbosacral region: Secondary | ICD-10-CM | POA: Diagnosis not present

## 2020-05-08 DIAGNOSIS — G7111 Myotonic muscular dystrophy: Secondary | ICD-10-CM | POA: Insufficient documentation

## 2020-05-08 NOTE — Progress Notes (Signed)
PROVIDER NOTE: Information contained herein reflects review and annotations entered in association with encounter. Interpretation of such information and data should be left to medically-trained personnel. Information provided to patient can be located elsewhere in the medical record under "Patient Instructions". Document created using STT-dictation technology, any transcriptional errors that may result from process are unintentional.    Patient: Vickie Stevens  Service Category: E/M  Provider: Gillis Santa, MD  DOB: 06-25-1971  DOS: 05/08/2020  Specialty: Interventional Pain Management  MRN: [id number]  Setting: Ambulatory outpatient  PCP: Jodi Marble, MD  Type: Established Patient    Referring Provider: Jodi Marble, MD  Location: Office  Delivery: Face-to-face     HPI  [name], a 49 y.o. year old female, is here today because of her Lumbar facet arthropathy [M47.816]. Ms. Buenaventura primary complain today is Back Pain (bilateral), Shoulder Pain (left), and Foot Pain (right) Last encounter: My last encounter with her was on 04/15/2020. Pertinent problems: Ms. 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 Pain Assessment: Severity of Chronic pain is reported as a 6 /10. Location: Back Lower/right leg. Onset: More than a month ago. Quality: Aching, Constant, Throbbing. Timing: Constant. Modifying factor(s): PNS, heat. Vitals:  weight is 230 lb (104.3 kg). Her temporal temperature is 97 F (36.1 C) (abnormal). Her blood pressure is 116/76 and her pulse is 87. Her respiration is 18 and oxygen saturation is 100%.   Reason for encounter: post-procedure assessment.   Hano presents today to have her left Sprint  peripheral nerve system removed.  She has undergone left L4 medial branch peripheral nerve stimulation for approximately the last 2 months.  She endorses significant pain relief in regards to her axial low back pain on the left.  She currently has a right sided L4 medial branch peripheral nerve stimulation system in place.  This lead was replaced.  She will have this in place for a little over a month.   ROS  Constitutional: Denies any fever or chills Gastrointestinal: No reported hemesis, hematochezia, vomiting, or acute GI distress Musculoskeletal: Low back pain, responding to sprint peripheral nerve stimulation Neurological: No reported episodes of acute onset apraxia, aphasia, dysarthria, agnosia, amnesia, paralysis, loss of coordination, or loss of consciousness  Medication Review  Cholecalciferol, Dextran 70-Hypromellose (PF), FLUoxetine, QUEtiapine Fumarate, amLODipine, atorvastatin, loratadine, metFORMIN, montelukast, and olmesartan  History Review  Allergy: Ms. Pomplun is allergic to phenergan [promethazine hcl], codeine, cortisone, lidocaine, and oxycodone. Drug: Ms. Lamore  reports no history of drug use. Alcohol:  reports no history of alcohol use. Tobacco:  reports that she has never smoked. She has never used smokeless tobacco. Social: Ms. Platts  reports that she has never smoked. She has never used smokeless tobacco. She reports that she does not drink alcohol and does not use drugs. 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 Surgical: Ms. Tenbrink  has a past surgical history that includes Endometrial  ablation (01-2011); Shoulder surgery (Left, 07-1999, 07-2011); Cataract extraction, bilateral; Esophagogastroduodenoscopy (egd) with propofol (N/A, 06/08/2018); Colonoscopy with propofol (  N/A, 06/08/2018); and SCS 8/ 2021. Family: family history includes Diabetes in her father; Hyperlipidemia in her father; Hypertension in her father.  Laboratory Chemistry Profile   Renal Lab Results  Component Value Date   BUN 22 (H) 12/08/2017   CREATININE 0.60 12/08/2017   GFRAA >60 12/08/2017   GFRNONAA >60 12/08/2017     Hepatic Lab Results  Component Value Date   AST 18 12/08/2017   ALT 14 12/08/2017   ALBUMIN 3.3 (L) 12/08/2017   ALKPHOS 75 12/08/2017     Electrolytes Lab Results  Component Value Date   NA 139 12/08/2017   K 3.1 (L) 12/08/2017   CL 103 12/08/2017   CALCIUM 8.9 12/08/2017     Bone No results found for: VD25OH, VD125OH2TOT, CM0349ZP9, XT0569VX4, 25OHVITD1, 25OHVITD2, 25OHVITD3, TESTOFREE, TESTOSTERONE   Inflammation (CRP: Acute Phase) (ESR: Chronic Phase) No results found for: CRP, ESRSEDRATE, LATICACIDVEN     Note: Above Lab results reviewed.   Physical Exam  General appearance: Well nourished, well developed, and well hydrated. In no apparent acute distress Mental status: Alert, oriented x 3 (person, place, & time)       Respiratory: No evidence of acute respiratory distress Eyes: PERLA Vitals: BP 116/76   Pulse 87   Temp (!) 97 F (36.1 C) (Temporal)   Resp 18   Wt 230 lb (104.3 kg)   SpO2 100%   BMI 33.97 kg/m  BMI: Estimated body mass index is 33.97 kg/m as calculated from the following:   Height as of 04/15/20: 5' 9"  (1.753 m).   Weight as of this encounter: 230 lb (104.3 kg). Ideal: Ideal body weight: 66.2 kg (145 lb 15.1 oz) Adjusted ideal body weight: 81.5 kg (179 lb 9.1 oz)  5 out of 5 strength bilateral lower extremity: Plantar flexion, dorsiflexion, knee flexion, knee extension.   Improvement in lower back range of motion.  Decreased  pain. Left L4 medial branch Sprint peripheral nerve stimulation system removed, tips primarily intact however small end of catheter tip retained in subcutaneous tissue during pull.  Patient was given a handout with a card should she ever need an MRI in the future.  Patient will follow up December 16 for medication management and right L4 medial branch Sprint peripheral nerve stimulation system lead pull.  Assessment   Status Diagnosis  Controlled Controlled Controlled 1. Lumbar facet arthropathy   2. Lumbar and sacral arthritis   3. 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 Spinal stenosis, lumbar region, with neurogenic claudication   5. 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 Chronic pain syndrome        Plan of Care  Keep scheduled appointment for December 16 for right L4 medial branch Sprint peripheral nerve stimulation system lead pull and medication management. Follow-up plan:   Return for Keep sch. appt (add PNS lead pull to notes for that day in addition to MM).     Status post L4-L5 ESI #1 on 10/16/2019, repeat.  Left L4 Sprint peripheral nerve stimulation 02/19/2020; removed 05/08/2020., right L4 Sprint peripheral nerve stimulation 03/13/2020, lead fracture, replaced 04/15/2020.    Recent Visits Date Type Provider Dept  04/15/20 Procedure visit Gillis Santa, MD [facility]  04/02/20 Office Visit Gillis Santa, MD [facility]  03/13/20 Procedure visit Gillis Santa, MD [facility]  02/19/20 Procedure visit Gillis Santa, MD [facility]  Showing recent visits within past 90 days and meeting all other requirements Today's Visits Date Type Provider Dept  05/08/20 Procedure  visit Gillis Santa, MD [facility]  Showing today's visits and meeting all other requirements Future Appointments Date Type Provider Dept  06/20/20 Appointment Gillis Santa, MD [facility]  Showing future appointments within next 90 days and meeting all other  requirements  I discussed the assessment and treatment plan with the patient. The patient was provided an opportunity to ask questions and all were answered. The patient agreed with the plan and demonstrated an understanding of the instructions.  Patient advised to call back or seek an in-person evaluation if the symptoms or condition worsens.  Duration of encounter: 92mnutes.  Note by: BGillis Santa MD Date: 05/08/2020; Time: 10:25 AM

## 2020-05-08 NOTE — Progress Notes (Signed)
Safety precautions to be maintained throughout the outpatient stay will include: orient to surroundings, keep bed in low position, maintain call bell within reach at all times, provide assistance with transfer out of bed and ambulation.    1011 PNS lead pull per Dr. Holley Raring on the left lumbar area. No S/S infection. Leads intact except small lead tip.

## 2020-06-20 ENCOUNTER — Encounter: Payer: Self-pay | Admitting: Student in an Organized Health Care Education/Training Program

## 2020-06-20 ENCOUNTER — Other Ambulatory Visit: Payer: Self-pay

## 2020-06-20 ENCOUNTER — Ambulatory Visit
Payer: Medicare PPO | Attending: Student in an Organized Health Care Education/Training Program | Admitting: Student in an Organized Health Care Education/Training Program

## 2020-06-20 VITALS — BP 113/80 | HR 97 | Temp 97.3°F | Ht 69.0 in | Wt 230.0 lb

## 2020-06-20 DIAGNOSIS — M47816 Spondylosis without myelopathy or radiculopathy, lumbar region: Secondary | ICD-10-CM | POA: Diagnosis not present

## 2020-06-20 DIAGNOSIS — G7111 Myotonic muscular dystrophy: Secondary | ICD-10-CM

## 2020-06-20 DIAGNOSIS — M19011 Primary osteoarthritis, right shoulder: Secondary | ICD-10-CM

## 2020-06-20 DIAGNOSIS — M5416 Radiculopathy, lumbar region: Secondary | ICD-10-CM

## 2020-06-20 DIAGNOSIS — M47817 Spondylosis without myelopathy or radiculopathy, lumbosacral region: Secondary | ICD-10-CM

## 2020-06-20 DIAGNOSIS — M75101 Unspecified rotator cuff tear or rupture of right shoulder, not specified as traumatic: Secondary | ICD-10-CM | POA: Diagnosis not present

## 2020-06-20 DIAGNOSIS — M25511 Pain in right shoulder: Secondary | ICD-10-CM

## 2020-06-20 DIAGNOSIS — G8929 Other chronic pain: Secondary | ICD-10-CM

## 2020-06-20 DIAGNOSIS — M48062 Spinal stenosis, lumbar region with neurogenic claudication: Secondary | ICD-10-CM

## 2020-06-20 DIAGNOSIS — M12811 Other specific arthropathies, not elsewhere classified, right shoulder: Secondary | ICD-10-CM

## 2020-06-20 DIAGNOSIS — E7402 Pompe disease: Secondary | ICD-10-CM

## 2020-06-20 DIAGNOSIS — G894 Chronic pain syndrome: Secondary | ICD-10-CM

## 2020-06-20 MED ORDER — BELBUCA 150 MCG BU FILM: 1.0000 | ORAL_FILM | Freq: Two times a day (BID) | BUCCAL | 2 refills | Status: DC

## 2020-06-20 NOTE — Progress Notes (Signed)
PROVIDER NOTE: Information contained herein reflects review and annotations entered in association with encounter. Interpretation of such information and data should be left to medically-trained personnel. Information provided to patient can be located elsewhere in the medical record under "Patient Instructions". Document created using STT-dictation technology, any transcriptional errors that may result from process are unintentional.    Patient: Vickie Stevens  Service Category: E/M  Provider: Gillis Santa, MD  DOB: 1971/03/30  DOS: 06/20/2020  Specialty: Interventional Pain Management  MRN: [id number]  Setting: Ambulatory outpatient  PCP: Jodi Marble, MD  Type: Established Patient    Referring Provider: Jodi Marble, MD  Location: Office  Delivery: Face-to-face     HPI  [name], a 49 y.o. year old female, is here today because of her Lumbar facet arthropathy [M47.816]. Ms. Linsley primary complain today is Shoulder Pain ([place]ilat, right is worse) and Back Pain (Lower, right side worse) Last encounter: My last encounter with her was on 05/08/2020. Pertinent problems: Ms. 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 Pain Assessment: Severity of Chronic pain is reported as a 5 /10. Location: Shoulder Right,Left/from neck to fingers bilat. Onset: More than a month ago. Quality:  . Timing: Constant. Modifying factor(s): sprint, buccal films. Vitals:  height is 5' 9"  (1.753 m) and weight is 230 lb (104.3 kg). Her temporal temperature is 97.3 F (36.3 C) (abnormal). Her blood pressure is 113/80 and her pulse is 97. Her oxygen saturation is 95%.   Reason for encounter: Removal of Sprint peripheral nerve stimulation lead  and medication management and to also address right shoulder pain's  Patient endorses significant pain relief from her Sprint peripheral nerve stimulation therapy.  She is here today to have her right Sprint peripheral nerve stimulator lead removed.  She states that she has obtained approximately 75% pain relief and states that this is been life-changing for her.  She is more functional and her mood is also better given that she has less pain.  She states that she is able to ambulate without a walker.   Patient would like to trial peripheral nerve stimulation for her right shoulder pain.  She has posterior shoulder pain.  I discussed suprascapular nerve peripheral nerve stimulation, risks and benefits reviewed and patient would like to proceed with this.  We will plan on doing this in 4 weeks.  I will also refill her belbuca as below.  Pharmacotherapy Assessment   06/01/2020  04/02/2020   1  Belbuca 150 Mcg Film  60.00  30  Bi Lat  [id number]  Nor (0290)  2/2  0.30 mg  Medicare  [place]      Analgesic: Belbuca 150 mcg twice daily    Monitoring: [place] PMP: PDMP reviewed during this encounter.       Pharmacotherapy: No side-effects or adverse reactions reported. Compliance: No problems identified. Effectiveness: Clinically acceptable.  Rise Patience, RN  06/20/2020  1:02 PM  Sign when Signing Visit Nursing Pain Medication Assessment:  Safety precautions to be maintained throughout the outpatient stay will include: orient to surroundings, keep bed in low position, maintain call bell within reach at all times, provide assistance with transfer out of bed and ambulation.  Medication Inspection Compliance: Pill count conducted under aseptic conditions, in front of the patient. Neither the pills  nor the bottle was removed from the patient's sight at any time. Once count was completed pills were immediately returned to the patient in their original bottle.  Medication: Buprenorphine (Suboxone) Pill/Patch  Count: 13 of 60 patches remain Pill/Patch Appearance: Markings consistent with prescribed medication Bottle Appearance: Standard pharmacy container. Clearly labeled. Filled Date: 69 / 27 / 2021 Last Medication intake:  Today    UDS:  Summary  Date Value Ref Range Status  04/02/2020 Note  Final    Comment:    ==================================================================== [employer] Select 13 (MW) ==================================================================== Test                             Result       Flag       Units  Drug Present and Declared for Prescription Verification   Buprenorphine                  12           EXPECTED   ng/mg creat   Norbuprenorphine               22           EXPECTED   ng/mg creat    Source of buprenorphine is a scheduled prescription medication.    Norbuprenorphine is an expected metabolite of buprenorphine.  ==================================================================== Test                      Result    Flag   Units      Ref Range   Creatinine              142              mg/dL      >=20 ==================================================================== Declared Medications:  The flagging and interpretation on this report are based on the  following declared medications.  Unexpected results may arise from  inaccuracies in the declared medications.   **Note: The testing scope of this panel does not include small to  moderate amounts of these reported medications:   Buprenorphine   **Note: The testing scope of this panel does not include the  following reported medications:   Amlodipine  Atorvastatin  Cholecalciferol  Eye Drop  Fluoxetine  Loratadine (Claritin)  Metformin  Montelukast  Olmesartan (Benicar)  Quetiapine (Seroquel) ==================================================================== For clinical consultation, please call [phone]. ====================================================================      ROS  Constitutional: Denies any fever or chills Gastrointestinal: No reported hemesis, hematochezia, vomiting, or acute GI distress Musculoskeletal: Right shoulder pain, posterior dominant Neurological: No reported episodes of acute onset apraxia, aphasia, dysarthria, agnosia, amnesia, paralysis, loss of coordination, or loss of consciousness  Medication Review  Buprenorphine HCl, Cholecalciferol, Dextran 70-Hypromellose (PF), FLUoxetine, QUEtiapine Fumarate, amLODipine, atorvastatin, loratadine, metFORMIN, montelukast, and olmesartan  History Review  Allergy: Ms. Mohiuddin is allergic to phenergan [promethazine hcl], codeine, cortisone, lidocaine, and oxycodone. Drug: Ms. Halder  reports no history of drug use. Alcohol:  reports no history of alcohol use. Tobacco:  reports that she has never smoked. She has never used smokeless tobacco. Social: Ms. Wisman  reports that she has never smoked. She has never used smokeless tobacco. She reports that she does not drink alcohol and does not use drugs. 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 Surgical: Ms. Soth  has a past surgical history that includes Endometrial ablation (01-2011); Shoulder surgery (Left, 07-1999, 07-2011); Cataract extraction, bilateral; Esophagogastroduodenoscopy (egd) with propofol (N/A, 06/08/2018); Colonoscopy with propofol (N/A, 06/08/2018); and SCS 8/ 2021. Family: family history includes Diabetes in her father; Hyperlipidemia in her father; Hypertension in her  father.  Laboratory Chemistry Profile   Renal Lab Results  Component Value Date   BUN 22 (H) 12/08/2017   CREATININE 0.60 12/08/2017   GFRAA >60 12/08/2017   GFRNONAA >60 12/08/2017     Hepatic Lab Results  Component Value Date   AST 18 12/08/2017   ALT 14 12/08/2017   ALBUMIN 3.3 (L) 12/08/2017   ALKPHOS 75 12/08/2017     Electrolytes Lab Results  Component Value Date   NA 139 12/08/2017   K 3.1 (L) 12/08/2017   CL 103 12/08/2017   CALCIUM 8.9 12/08/2017     Bone No results found for: VD25OH, VD125OH2TOT, CV8938BO1, BP1025EN2, 25OHVITD1, 25OHVITD2, 25OHVITD3, TESTOFREE, TESTOSTERONE   Inflammation (CRP: Acute Phase) (ESR: Chronic Phase) No results found for: CRP, ESRSEDRATE, LATICACIDVEN     Note: Above Lab results reviewed.   Physical Exam  General appearance: Well nourished, well developed, and well hydrated. In no apparent acute distress Mental status: Alert, oriented x 3 (person, place, & time)       Respiratory: No evidence of acute respiratory distress Eyes: PERLA Vitals: BP 113/80   Pulse 97   Temp (!) 97.3 F (36.3 C) (Temporal)   Ht 5' 9"  (1.753 m)   Wt 230 lb (104.3 kg)   SpO2 95%   BMI 33.97 kg/m  BMI: Estimated body mass index is 33.97 kg/m as calculated from the following:   Height as of this encounter: 5' 9"  (1.753 m).   Weight as of this encounter: 230 lb (104.3 kg). Ideal: Ideal body weight: 66.2 kg (145 lb 15.1 oz) Adjusted ideal body weight: 81.5 kg (179 lb 9.1 oz)  Cervical Spine Area Exam  Skin & Axial Inspection: No masses, redness, edema, swelling, or associated skin lesions Alignment: Symmetrical Functional ROM: Decreased ROM      Stability: No instability detected Muscle Tone/Strength: Functionally intact. No obvious neuro-muscular anomalies detected. Sensory (Neurological): Neurogenic pain pattern  Upper Extremity (UE) Exam    Side: Right upper extremity  Side: Left upper extremity  Skin & Extremity Inspection: Skin color,  temperature, and hair growth are WNL. No peripheral edema or cyanosis. No masses, redness, swelling, asymmetry, or associated skin lesions. No contractures.  Skin & Extremity Inspection: Skin color, temperature, and hair growth are WNL. No peripheral edema or cyanosis. No masses, redness, swelling, asymmetry, or associated skin lesions. No contractures.  Functional ROM: Pain restricted ROM for shoulder  Functional ROM: Unrestricted ROM          Muscle Tone/Strength: Functionally intact. No obvious neuro-muscular anomalies detected.  Muscle Tone/Strength: Functionally intact. No obvious neuro-muscular anomalies detected.  Sensory (Neurological): Arthropathic arthralgia          Sensory (Neurological): Unimpaired          Palpation: No palpable anomalies              Palpation: No palpable anomalies              Provocative Test(s):  Phalen's test: deferred Tinel's test: deferred Apley's scratch test (touch opposite shoulder):  Action 1 (Across chest): Decreased  ROM Action 2 (Overhead): Decreased ROM Action 3 (LB reach): Decreased ROM   Provocative Test(s):  Phalen's test: deferred Tinel's test: deferred Apley's scratch test (touch opposite shoulder):  Action 1 (Across chest): deferred Action 2 (Overhead): deferred Action 3 (LB reach): deferred    Lumbar Spine Area Exam  Skin & Axial Inspection: No masses, redness, or swelling Alignment: Symmetrical Functional ROM: Improved after treatment       Stability: No instability detected Muscle Tone/Strength: Functionally intact. No obvious neuro-muscular anomalies detected. Sensory (Neurological): Improved  Provocative Tests: Hyperextension/rotation test: Improved after treatment        Right sprint peripheral neurostimulator lead removed, tip intact  Gait & Posture Assessment  Ambulation: Improved after lumbar medial branch peripheral nerve stimulation therapy Gait: Relatively normal for age and body habitus Posture: WNL    Assessment    Status Diagnosis  Improved Improved Worsening 1. Lumbar facet arthropathy   2. Lumbar and sacral arthritis   3. Arthropathy of right shoulder   4. Right rotator cuff tear arthropathy   5. 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 Spinal stenosis, lumbar region, with neurogenic claudication   8. Lumbar radiculopathy   9. Chronic pain syndrome   10. Chronic right shoulder pain      Updated Problems: Problem  Arthropathy of Right Shoulder    Plan of Care  [name] has a current medication list which includes the following long-term medication(s): amlodipine, atorvastatin, fluoxetine, loratadine, montelukast, and olmesartan.  1.  Low back pain related to lumbar facet arthropathy improved after Sprint peripheral nerve stimulation therapy.  We will continue to monitor how she does long-term. 2.  Sprint peripheral nerve stimulation of right suprascapular nerve for persistent right shoulder pain related to shoulder osteoarthritis and rotator cuff dysfunction.  Risks and benefits reviewed in great detail. 3.  Refill belbuca as below.  Pharmacotherapy (Medications Ordered): Meds ordered this encounter  Medications  . Buprenorphine HCl (BELBUCA) 150 MCG FILM    Sig: Place 1 Film inside cheek every 12 (twelve) hours.    Dispense:  60 each    Refill:  2    For chronic pain syndrome   Orders:  Orders Placed This Encounter  Procedures  . Peripheral Nerve Stimulation    Standing Status:   Future    Standing Expiration Date:   06/20/2021    Scheduling Instructions:     Right suprascapular nerve sprint PNS    Order Specific Question:   Where will this procedure be performed?    Answer:   [facility]   Follow-up plan:   Return in about 6 weeks (around 08/01/2020) for right SS nerve sprint PNS.     Status post L4-L5 ESI #1 on 10/16/2019, repeat.  Left L4 Sprint peripheral nerve stimulation 02/19/2020; removed 05/08/2020., right L4 Sprint  peripheral nerve stimulation 03/13/2020, lead fracture, replaced 04/15/2020, right removed 06/20/2020.     Recent Visits Date Type Provider Dept  05/08/20 Procedure visit Gillis Santa, MD [facility]  04/15/20 Procedure visit Gillis Santa, MD [facility]  04/02/20 Office Visit Gillis Santa, MD [facility]  Showing recent visits within past 90 days and meeting all other requirements Today's Visits Date Type Provider Dept  06/20/20 Office Visit Gillis Santa, MD [facility]  Showing today's visits and meeting all other requirements Future Appointments Date Type Provider Dept  07/29/20 Appointment Gillis Santa, [facility]  09/17/20 Appointment Gillis Santa,  MD [facility]  Showing future appointments within next 90 days and meeting all other requirements  I discussed the assessment and treatment plan with the patient. The patient was provided an opportunity to ask questions and all were answered. The patient agreed with the plan and demonstrated an understanding of the instructions.  Patient advised to call back or seek an in-person evaluation if the symptoms or condition worsens.  Duration of encounter:28mnutes.  Note by: BGillis Santa MD Date: 06/20/2020; Time: 2:24 PM

## 2020-06-20 NOTE — Progress Notes (Signed)
Nursing Pain Medication Assessment:  Safety precautions to be maintained throughout the outpatient stay will include: orient to surroundings, keep bed in low position, maintain call bell within reach at all times, provide assistance with transfer out of bed and ambulation.  Medication Inspection Compliance: Pill count conducted under aseptic conditions, in front of the patient. Neither the pills nor the bottle was removed from the patient's sight at any time. Once count was completed pills were immediately returned to the patient in their original bottle.  Medication: Buprenorphine (Suboxone) Pill/Patch Count: 13 of 60 patches remain Pill/Patch Appearance: Markings consistent with prescribed medication Bottle Appearance: Standard pharmacy container. Clearly labeled. Filled Date: 27 / 27 / 2021 Last Medication intake:  Today

## 2020-07-29 ENCOUNTER — Ambulatory Visit: Admission: RE | Admit: 2020-07-29 | Payer: Medicare PPO | Source: Ambulatory Visit

## 2020-07-29 ENCOUNTER — Ambulatory Visit
Payer: Medicare PPO | Attending: Student in an Organized Health Care Education/Training Program | Admitting: Student in an Organized Health Care Education/Training Program

## 2020-07-29 ENCOUNTER — Other Ambulatory Visit: Payer: Self-pay

## 2020-07-29 ENCOUNTER — Encounter: Payer: Self-pay | Admitting: Student in an Organized Health Care Education/Training Program

## 2020-07-29 DIAGNOSIS — M75101 Unspecified rotator cuff tear or rupture of right shoulder, not specified as traumatic: Secondary | ICD-10-CM | POA: Diagnosis present

## 2020-07-29 DIAGNOSIS — G8929 Other chronic pain: Secondary | ICD-10-CM

## 2020-07-29 DIAGNOSIS — M25511 Pain in right shoulder: Secondary | ICD-10-CM

## 2020-07-29 DIAGNOSIS — G894 Chronic pain syndrome: Secondary | ICD-10-CM

## 2020-07-29 DIAGNOSIS — M19011 Primary osteoarthritis, right shoulder: Secondary | ICD-10-CM | POA: Diagnosis not present

## 2020-07-29 DIAGNOSIS — M12811 Other specific arthropathies, not elsewhere classified, right shoulder: Secondary | ICD-10-CM | POA: Diagnosis present

## 2020-07-29 MED ORDER — ROPIVACAINE HCL 2 MG/ML IJ SOLN
INTRAMUSCULAR | Status: AC
  Filled 2020-07-29: qty 10

## 2020-07-29 MED ORDER — LIDOCAINE HCL 2 % IJ SOLN
20.0000 mL | Freq: Once | INTRAMUSCULAR | Status: AC
  Administered 2020-07-29: 100 mg
  Filled 2020-07-29: qty 20

## 2020-07-29 MED ORDER — ROPIVACAINE HCL 2 MG/ML IJ SOLN
9.0000 mL | Freq: Once | INTRAMUSCULAR | Status: AC
  Administered 2020-07-29: 10 mL via PERINEURAL

## 2020-07-29 MED ORDER — LIDOCAINE HCL (PF) 2 % IJ SOLN
INTRAMUSCULAR | Status: AC
  Filled 2020-07-29: qty 5

## 2020-07-29 NOTE — Progress Notes (Signed)
Safety precautions to be maintained throughout the outpatient stay will include: orient to surroundings, keep bed in low position, maintain call bell within reach at all times, provide assistance with transfer out of bed and ambulation.  

## 2020-07-29 NOTE — Progress Notes (Signed)
PROVIDER NOTE: Information contained herein reflects review and annotations entered in association with encounter. Interpretation of such information and data should be left to medically-trained personnel. Information provided to patient can be located elsewhere in the medical record under "Patient Instructions". Document created using STT-dictation technology, any transcriptional errors that may result from process are unintentional.    Patient: Vickie Stevens  Service Category: Procedure  Provider: Gillis Santa, MD  DOB: 09-07-1970  DOS: 07/29/2020  Location: [facility]  MRN: [id number]  Setting: Ambulatory - outpatient  Referring Provider: Gillis Santa, MD  Type: Established Patient  Specialty: Interventional Pain Management  PCP: Jodi Marble, MD   Primary Reason for Visit: Interventional Pain Management Treatment. CC: Pain (Shoulder right )  Procedure:          Anesthesia, Analgesia, Anxiolysis:  Type: Right axillary  nerve peripheral nerve stimulation therapy with SPRINT   Laterality: Right-Side  Type: Local Anesthesia  Local Anesthetic: Lidocaine 1-2%  Position: Sitting   Indications: 1. Chronic pain syndrome   2. Arthropathy of right shoulder   3. Right rotator cuff tear arthropathy   4. Chronic right shoulder pain    Pain Score: Pre-procedure: 5 /10 Post-procedure: 5 /10   Pre-op H&P Assessment:  Vickie Stevens is a 50 y.o. (year old), female patient, seen today for interventional treatment. She  has a past surgical history that includes Endometrial ablation (01-2011); Shoulder surgery (Left, 07-1999, 07-2011); Cataract extraction, bilateral; Esophagogastroduodenoscopy (egd) with propofol (N/A, 06/08/2018); Colonoscopy with propofol (N/A, 06/08/2018); and SCS 8/ 2021. Vickie Stevens has a current medication list which includes the following prescription(s): amlodipine, atorvastatin, belbuca, cholecalciferol, dextran 70-hypromellose (pf), fluoxetine, loratadine,  metformin, montelukast, olmesartan, and quetiapine fumarate, and the following Facility-Administered Medications: bupivacaine, bupivacaine (pf), lactated ringers, orphenadrine, and triamcinolone acetonide. Her primarily concern today is the Pain (Shoulder right )  Initial Vital Signs:  Pulse/HCG Rate: 77ECG Heart Rate: 78 Temp: (!) 97.3 F (36.3 C) Resp: 18 BP: 118/79 SpO2: 100 %  BMI: Estimated body mass index is 34.85 kg/m as calculated from the following:   Height as of this encounter: 5' 9"  (1.753 m).   Weight as of this encounter: 236 lb (107 kg).  Risk Assessment: Allergies: Reviewed. She is allergic to phenergan [promethazine hcl], codeine, cortisone, lidocaine, and oxycodone.  Allergy Precautions: None required Coagulopathies: Reviewed. None identified.  Blood-thinner therapy: None at this time Active Infection(s): Reviewed. None identified. Vickie Stevens is afebrile  Site Confirmation: Vickie Stevens was asked to confirm the procedure and laterality before marking the site Procedure checklist: Completed Consent: Before the procedure and under the influence of no sedative(s), amnesic(s), or anxiolytics, the patient was informed of the treatment options, risks and possible complications. To fulfill our ethical and legal obligations, as recommended by the American Medical Association's Code of Ethics, I have informed the patient of my clinical impression; the nature and purpose of the treatment or procedure; the risks, benefits, and possible complications of the intervention; the alternatives, including doing nothing; the risk(s) and benefit(s) of the alternative treatment(s) or procedure(s); and the risk(s) and benefit(s) of doing nothing. The patient was provided information about the general risks and possible complications associated with the procedure. These may include, but are not limited to: failure to achieve desired goals, infection, bleeding, organ or nerve damage, allergic  reactions, paralysis, and death. In addition, the patient was informed of those risks and complications associated to the procedure, such as failure to decrease pain; infection; bleeding; organ or nerve  damage with subsequent damage to sensory, motor, and/or autonomic systems, resulting in permanent pain, numbness, and/or weakness of one or several areas of the body; allergic reactions; (i.e.: anaphylactic reaction); and/or death. Furthermore, the patient was informed of those risks and complications associated with the medications. These include, but are not limited to: allergic reactions (i.e.: anaphylactic or anaphylactoid reaction(s)); adrenal axis suppression; blood sugar elevation that in diabetics may result in ketoacidosis or comma; water retention that in patients with history of congestive heart failure may result in shortness of breath, pulmonary edema, and decompensation with resultant heart failure; weight gain; swelling or edema; medication-induced neural toxicity; particulate matter embolism and blood vessel occlusion with resultant organ, and/or nervous system infarction; and/or aseptic necrosis of one or more joints. Finally, the patient was informed that Medicine is not an exact science; therefore, there is also the possibility of unforeseen or unpredictable risks and/or possible complications that may result in a catastrophic outcome. The patient indicated having understood very clearly. We have given the patient no guarantees and we have made no promises. Enough time was given to the patient to ask questions, all of which were answered to the patient's satisfaction. Vickie Stevens has indicated that she wanted to continue with the procedure. Attestation: I, the ordering provider, attest that I have discussed with the patient the benefits, risks, side-effects, alternatives, likelihood of achieving goals, and potential problems during recovery for the procedure that I have provided informed  consent. Date  Time: 07/29/2020  8:12 AM  Pre-Procedure Preparation:  Monitoring: As per clinic protocol. Respiration, ETCO2, SpO2, BP, heart rate and rhythm monitor placed and checked for adequate function Safety Precautions: Patient was assessed for positional comfort and pressure points before starting the procedure. Time-out: I initiated and conducted the "Time-out" before starting the procedure, as per protocol. The patient was asked to participate by confirming the accuracy of the "Time Out" information. Verification of the correct person, site, and procedure were performed and confirmed by me, the nursing staff, and the patient. "Time-out" conducted as per Joint Commission's Universal Protocol (UP.01.01.01). Time: 0842  Description of Procedure:          Area Prepped: Entire shoulder Area DuraPrep (Iodine Povacrylex [0.7% available iodine] and Isopropyl Alcohol, 74% w/w)   After the risks, benefits and alternatives were discussed  with the patient and informed consent was obtained, patient  was placed in the sitting position with arm relaxed at side.  Prior to delivery of anesthetics, the painful region was carefully  outlined with a marker. Appropriate skin and bony landmarks  were identified, and pertinent vascular structures were located.   The skin overlying the shoulder area was prepped and  draped in sterile fashion.  Ultrasound and landmark technique was used to identify the deltoid muscle.  An introducer needle and stimulating probe were  assembled, inserted and advanced 6 cm below the acromion within the middle third of the deltoid muscle bounded on the bottom by the insertion of the deltoid muscle into the fold of the axilla.  Insertion was perpendicular to the skin.  Multiple stimulation parameters were used to deliver stimulation to the axillary nerve which was confirmed via ultrasound by observing the deltoid muscle holding tension.  The stimulating probe was removed from  the introducer and a percutaneous lead was guided through the needle and delivered to a location in similar proximity to the nerve.   Final location was verified with electrical stimulation and documented with ultrasound.   The introducer needle was  removed, and the exposed end of the percutaneous lead was attached to an external stimulator unit.   Various electrical parameter combinations were again tested until the patient indicated paresthesia or muscle tension  overlapping the distribution of the patient's typical region of pain.   After confirming that lead impedance was in the normal range, the external unit was detached, the needle was removed, and the lead was anchored at the skin.  The lead was threaded into the connector block and electrical continuity and desired patient response was confirmed. The connector block was attached to the external stimulator unit.The patient was observed for stability of vital signs and comfort.     Vitals:   07/29/20 0817 07/29/20 0843 07/29/20 0848 07/29/20 0851  BP: 118/79 121/82 110/83 118/85  Pulse: 77     Resp: 18 15 16 16   Temp: (!) 97.3 F (36.3 C)     TempSrc: Temporal     SpO2: 100% 98% 98% 98%  Weight: 236 lb (107 kg)     Height: 5' 9"  (1.753 m)       Start Time: 0842 hrs. End Time: 0852 hrs. Materials:    Imaging Guidance (Non-Spinal):          Type of Imaging Technique: Ultrasound guidance to confirm activation of deltoid muscle Indication(s): Assistance in needle guidance and placement for procedures requiring needle placement in or near specific anatomical locations not easily accessible without such assistance.   Antibiotic Prophylaxis:   Anti-infectives (From admission, onward)   None     Indication(s): None identified  Post-operative Assessment:  Post-procedure Vital Signs:  Pulse/HCG Rate: 7780 Temp: (!) 97.3 F (36.3 C) Resp: 16 BP: 118/85 SpO2: 98 %  EBL: None  Complications: No immediate  post-treatment complications observed by team, or reported by patient.  Note: The patient tolerated the entire procedure well. A repeat set of vitals were taken after the procedure and the patient was kept under observation following institutional policy, for this type of procedure. Post-procedural neurological assessment was performed, showing return to baseline, prior to discharge. The patient was provided with post-procedure discharge instructions, including a section on how to identify potential problems. Should any problems arise concerning this procedure, the patient was given instructions to immediately contact us, at any time, without hesitation. In any case, we plan to contact the patient by telephone for a follow-up status report regarding this interventional procedure.  Comments:  No additional relevant information.  Plan of Care  Chronic Opioid Analgesic:  Belbuca 150 mcg twice daily    Medications ordered for procedure: Meds ordered this encounter  Medications  . lidocaine (XYLOCAINE) 2 % (with pres) injection 400 mg  . ropivacaine (PF) 2 mg/mL (0.2%) (NAROPIN) injection 9 mL   Medications administered: We administered lidocaine and ropivacaine (PF) 2 mg/mL (0.2%).  See the medical record for exact dosing, route, and time of administration.  Follow-up plan:   Return in about 8 weeks (around 09/23/2020) for PNS lead removal.      Status post L4-L5 ESI #1 on 10/16/2019, repeat.  Left L4 Sprint peripheral nerve stimulation 02/19/2020; removed 05/08/2020., right L4 Sprint peripheral nerve stimulation 03/13/2020, lead fracture, replaced 04/15/2020, right removed 06/20/2020.  Right axillary peripheral nerve stimulation 07/29/2020.      Recent Visits Date Type Provider Dept  06/20/20 Office Visit Gillis Santa, MD [facility]  05/08/20 Procedure visit Gillis Santa, MD [facility]  Showing recent visits within past 90 days and meeting all other requirements Today's  Visits Date Type Provider Dept  07/29/20 Procedure visit Gillis Santa, MD [facility]  Showing today's visits and meeting all other requirements Future Appointments Date Type Provider Dept  09/23/20 Appointment Gillis Santa, MD [facility]  Showing future appointments within next 90 days and meeting all other requirements  Disposition: Discharge home  Discharge (Date  Time): 07/29/2020; 0900 hrs.   Primary Care Physician: Jodi Marble, MD Location: [facility] Note by: Gillis Santa, MD Date: 07/29/2020; Time: 9:16 AM  Disclaimer:  Medicine is not an exact science. The only guarantee in medicine is that nothing is guaranteed. It is important to note that the decision to proceed with this intervention was based on the information collected from the patient. The Data and conclusions were drawn from the patient's questionnaire, the interview, and the physical examination. Because the information was provided in large part by the patient, it cannot be guaranteed that it has not been purposely or unconsciously manipulated. Every effort has been made to obtain as much relevant data as possible for this evaluation. It is important to note that the conclusions that lead to this procedure are derived in large part from the available data. Always take into account that the treatment will also be dependent on availability of resources and existing treatment guidelines, considered by other Pain Management Practitioners as being common knowledge and practice, at the time of the intervention. For Medico-Legal purposes, it is also important to point out that variation in procedural techniques and pharmacological choices are the acceptable norm. The indications, contraindications, technique, and results of the above procedure should only be interpreted and judged by a Board-Certified Interventional Pain Specialist with extensive familiarity and expertise in  the same exact procedure and technique.

## 2020-07-30 ENCOUNTER — Telehealth: Payer: Self-pay | Admitting: *Deleted

## 2020-07-30 NOTE — Telephone Encounter (Signed)
No problems post procedure. 

## 2020-08-09 ENCOUNTER — Other Ambulatory Visit: Payer: Medicare PPO

## 2020-08-09 DIAGNOSIS — Z20822 Contact with and (suspected) exposure to covid-19: Secondary | ICD-10-CM

## 2020-08-11 LAB — NOVEL CORONAVIRUS, NAA: SARS-CoV-2, NAA: NOT DETECTED

## 2020-08-11 LAB — SARS-COV-2, NAA 2 DAY TAT

## 2020-08-27 ENCOUNTER — Telehealth: Payer: Self-pay

## 2020-08-27 NOTE — Telephone Encounter (Signed)
Pt states she need Pre-Authorization for her BelBuca, She need [employer] to cover it not [employer] her Authorization form expires 09/05/2020 she needs this done so she can have her [facility]

## 2020-08-27 NOTE — Telephone Encounter (Signed)
We can not do an early PA.

## 2020-08-28 ENCOUNTER — Other Ambulatory Visit: Payer: Self-pay | Admitting: Student in an Organized Health Care Education/Training Program

## 2020-08-28 DIAGNOSIS — M48062 Spinal stenosis, lumbar region with neurogenic claudication: Secondary | ICD-10-CM

## 2020-08-28 DIAGNOSIS — G894 Chronic pain syndrome: Secondary | ICD-10-CM

## 2020-08-28 DIAGNOSIS — M5416 Radiculopathy, lumbar region: Secondary | ICD-10-CM

## 2020-08-28 DIAGNOSIS — E7402 Pompe disease: Secondary | ICD-10-CM

## 2020-08-28 DIAGNOSIS — G7111 Myotonic muscular dystrophy: Secondary | ICD-10-CM

## 2020-09-10 ENCOUNTER — Other Ambulatory Visit: Payer: Self-pay | Admitting: Nurse Practitioner

## 2020-09-10 DIAGNOSIS — Z1231 Encounter for screening mammogram for malignant neoplasm of breast: Secondary | ICD-10-CM

## 2020-09-11 DIAGNOSIS — M7520 Bicipital tendinitis, unspecified shoulder: Secondary | ICD-10-CM | POA: Insufficient documentation

## 2020-09-11 DIAGNOSIS — M24119 Other articular cartilage disorders, unspecified shoulder: Secondary | ICD-10-CM | POA: Insufficient documentation

## 2020-09-11 DIAGNOSIS — M25519 Pain in unspecified shoulder: Secondary | ICD-10-CM | POA: Insufficient documentation

## 2020-09-12 ENCOUNTER — Ambulatory Visit (INDEPENDENT_AMBULATORY_CARE_PROVIDER_SITE_OTHER): Payer: Medicare PPO | Admitting: Podiatry

## 2020-09-12 ENCOUNTER — Encounter: Payer: Self-pay | Admitting: Podiatry

## 2020-09-12 ENCOUNTER — Other Ambulatory Visit: Payer: Self-pay

## 2020-09-12 ENCOUNTER — Other Ambulatory Visit: Payer: Self-pay | Admitting: Podiatry

## 2020-09-12 ENCOUNTER — Ambulatory Visit (INDEPENDENT_AMBULATORY_CARE_PROVIDER_SITE_OTHER): Payer: Federal, State, Local not specified - PPO

## 2020-09-12 DIAGNOSIS — M21371 Foot drop, right foot: Secondary | ICD-10-CM

## 2020-09-12 DIAGNOSIS — Q666 Other congenital valgus deformities of feet: Secondary | ICD-10-CM

## 2020-09-12 DIAGNOSIS — G7100 Muscular dystrophy, unspecified: Secondary | ICD-10-CM

## 2020-09-12 DIAGNOSIS — G629 Polyneuropathy, unspecified: Secondary | ICD-10-CM

## 2020-09-12 NOTE — Progress Notes (Signed)
Subjective:  Patient ID: Vickie Stevens, female    DOB: December 30, 1970,  MRN: [id number]  Chief Complaint  Patient presents with  . Foot Pain    Patient presents today for right foot drop and neuropathy.  She says she has MS and her right foot has no feeling and she trips alot    50 y.o. female presents with the above complaint.  Patient presents with a history of muscular dystrophy leading to the right side foot drop occasionally.  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  She states that she has been trying to manage it however she does not have much feeling and she tends to trip a lot because of diminished sensation.  She has not seen anyone else prior to seeing me to help address the dropfoot that she is getting as well as addressing the underlying flatfoot as well.  She denies any other acute complaints.  She has not seen anyone else prior to seeing me.  She is looking to possibly do bracing or orthotics to water it takes to help with this.   Review of Systems: Negative except as noted in the HPI. Denies N/V/F/Ch.  Past Medical History:  Diagnosis Date  . ADHD (attention deficit hyperactivity disorder)   . Biceps tendonitis   . Bipolar disorder ([place])   . Chronic back pain   . Cluster headache   . Crohn's disease ([place])   . DDD (degenerative disc disease), lumbar   . Depression   . Diabetes mellitus without complication ([place])   . Hypercholesteremia   . Hypertension   . PCOS (polycystic ovarian syndrome)   . Rotator cuff tendinitis   . Sleep apnea   . 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  amLODipine (NORVASC) 2.5 MG tablet, Take 2.5 mg by mouth daily., Disp: , Rfl:  .  atorvastatin (LIPITOR) 20 MG tablet, Take 20 mg by mouth daily., Disp: , Rfl: 0 .  Buprenorphine HCl (BELBUCA) 150 MCG FILM, Place 1 Film inside cheek every 12 (twelve) hours., Disp: 60 each, Rfl: 2 .  Cholecalciferol 1.25 MG (50000 UT) capsule, Take by mouth., Disp: , Rfl:   .  Dextran 70-Hypromellose, PF, 0.1-0.3 % SOLN, Place 1-2 drops into both eyes daily as needed (for dryness or irritation)., Disp: , Rfl:  .  FLUoxetine (PROZAC) 40 MG capsule, Take 40 mg by mouth daily., Disp: , Rfl: 0 .  loratadine (CLARITIN) 10 MG tablet, Take 10 mg by mouth daily. , Disp: , Rfl:  .  metFORMIN (GLUCOPHAGE-XR) 500 MG 24 hr tablet, Take 500 mg by mouth daily with breakfast., Disp: , Rfl:  .  montelukast (SINGULAIR) 10 MG tablet, Take 10 mg by mouth at bedtime., Disp: , Rfl:  .  olmesartan (BENICAR) 20 MG tablet, Take 20 mg by mouth daily., Disp: , Rfl:  .  QUEtiapine Fumarate (SEROQUEL XR) 150 MG 24 hr tablet, Take 150 mg by mouth at bedtime., Disp: , Rfl:  .  valACYclovir (VALTREX) 500 MG tablet, Take 500 mg by mouth every morning., Disp: , Rfl:   Current Facility-Administered Medications:  .  bupivacaine (MARCAINE) 0.5 % injection 30 mL, 30 mL, Other, Once, Mohammed Kindle, MD .  bupivacaine (PF) (MARCAINE) 0.25 % injection 30 mL, 30 mL, Other, Once, Mohammed Kindle, MD .  lactated ringers infusion 1,000 mL, 1,000 mL, Intravenous, Continuous, Mohammed Kindle, MD .  orphenadrine (NORFLEX) injection 60 mg, 60 mg, Intramuscular, Once, Mohammed Kindle, MD .  triamcinolone  acetonide (KENALOG-40) injection 40 mg, 40 mg, Other, Once, Mohammed Kindle, MD  Social History   Tobacco Use  Smoking Status Never Smoker  Smokeless Tobacco Never Used    Allergies  Allergen Reactions  . Phenergan [Promethazine Hcl] Other (See Comments)    Altered mental status; patient remarked that she ripped out her IV and tore up the hospital room  . Codeine Hives  . Cortisone Other (See Comments)    Caused cataracts  . Lidocaine Hives    With oral, only  . Oxycodone Diarrhea and Rash    Pruritic rash, also   Objective:  There were no vitals filed for this visit. There is no height or weight on file to calculate BMI. Constitutional Well developed. Well nourished.  Vascular Dorsalis pedis  pulses palpable bilaterally. Posterior tibial pulses palpable bilaterally. Capillary refill normal to all digits.  No cyanosis or clubbing noted. Pedal hair growth normal.  Neurologic Normal speech. Oriented to person, place, and time. Epicritic sensation to light touch grossly present bilaterally.  Dermatologic Nails well groomed and normal in appearance. No open wounds. No skin lesions.  Orthopedic:  Plantarflexion noted of the right foot compared to the left foot.  This was a resting stance.  Gait examination shows forefoot slap when ambulating.  Swing phase does not allow enough dorsiflexion to clear the ground.  These findings are occasional and not consistent through gait.  Gait examination also shows calcaneal valgus to many toe size partially able to recruit the arch with dorsiflexion of the hallux is a semiflexible pes planovalgus deformity   Radiographs: 3 views of skeletally mature adult right foot: No osteoarthritic changes noted.  No fractures noted.  Posterior plantar heel spurring noted.  Slight exostosis of the navicular bone noted.  No other bony abnormalities identified.  Assessment:   1. Pes planovalgus   2. Right foot drop   3. Muscular dystrophy ([place])    Plan:  Patient was evaluated and treated and all questions answered.  Right dropfoot with underlying pes planovalgus secondary to muscular dystrophy -I explained to the patient the etiology of dropfoot and pes planus and various treatment options were discussed.  I believe patient will benefit from an AFO brace that allows her to control the motion and to clear the ground when she is ambulating.  She can also qualify for spring-loaded bracing as well.  She may also need something for her flatfoot to address both sides possibly ability in orthotics if needed.  She will be scheduled to see Liliane Channel for his expert opinion to assess the right lower extremity for bracing/orthotics. -In the meantime for stability I believe  patient will benefit from Tri-Lock ankle brace.  Tri-Lock ankle brace was dispensed.  She will be using the brace at all times when she is ambulating on her foot.  She states she will.  No follow-ups on file.

## 2020-09-17 ENCOUNTER — Encounter: Payer: Medicare PPO | Admitting: Student in an Organized Health Care Education/Training Program

## 2020-09-23 ENCOUNTER — Encounter: Payer: Self-pay | Admitting: Student in an Organized Health Care Education/Training Program

## 2020-09-23 ENCOUNTER — Other Ambulatory Visit: Payer: Self-pay

## 2020-09-23 ENCOUNTER — Ambulatory Visit
Payer: Medicare PPO | Attending: Student in an Organized Health Care Education/Training Program | Admitting: Student in an Organized Health Care Education/Training Program

## 2020-09-23 DIAGNOSIS — M5416 Radiculopathy, lumbar region: Secondary | ICD-10-CM

## 2020-09-23 DIAGNOSIS — G7111 Myotonic muscular dystrophy: Secondary | ICD-10-CM

## 2020-09-23 DIAGNOSIS — M48062 Spinal stenosis, lumbar region with neurogenic claudication: Secondary | ICD-10-CM

## 2020-09-23 DIAGNOSIS — E7402 Pompe disease: Secondary | ICD-10-CM

## 2020-09-23 DIAGNOSIS — G894 Chronic pain syndrome: Secondary | ICD-10-CM

## 2020-09-23 MED ORDER — BELBUCA 150 MCG BU FILM: 1.0000 | ORAL_FILM | Freq: Two times a day (BID) | BUCCAL | 2 refills | Status: DC

## 2020-09-23 NOTE — Progress Notes (Signed)
PROVIDER NOTE: Information contained herein reflects review and annotations entered in association with encounter. Interpretation of such information and data should be left to medically-trained personnel. Information provided to patient can be located elsewhere in the medical record under "Patient Instructions". Document created using STT-dictation technology, any transcriptional errors that may result from process are unintentional.    Patient: Vickie Stevens  Service Category: E/M  Provider: Gillis Santa, MD  DOB: 1971-05-11  DOS: 09/23/2020  Specialty: Interventional Pain Management  MRN: [id number]  Setting: Ambulatory outpatient  PCP: Vickie Marble, MD  Type: Established Patient    Referring Provider: Jodi Marble, MD  Location: Office  Delivery: Face-to-face     HPI  [name], a 50 y.o. year old female, is here today because of her No primary diagnosis found.. [name] primary complain today is Shoulder Pain (Right ) and Back Pain (lumbar) Last encounter: My last encounter with her was on 08/28/2020. Pertinent problems: 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 Pain Assessment: Severity of Chronic pain is reported as a 3 /10. Location: Shoulder Right/denies. Onset: More than a month ago. Quality: Discomfort,Aching,Dull. Timing: Constant. Modifying factor(s): PNS stiumlator has helped and films work.  has also helped her back. Vitals:  height is 5' 9"  (1.753 m) and weight is 236 lb (107 kg). Her temporal temperature is 97 F (36.1 C) (abnormal). Her blood pressure is 118/77 and her pulse is 72. Her respiration is 16 and oxygen saturation is 100%.   Reason for encounter: Patient presents today for  right axillary nerve Sprint peripheral nerve stimulator lead removal.  She endorses significant pain relief for her right shoulder pain as well as improvement in her range of motion with peripheral nerve stimulation of her right axillary nerve.  She is pleased with the pain relief that she has experience with this so far.  She is also due for a refill of her belbuca for chronic pain syndrome.  She continues to perform home physical therapy exercises that she has learned in the past to help with stretching and overall pain.  Pharmacotherapy Assessment   Analgesic: Belbuca 150 mcg twice daily    Monitoring: [place] PMP: PDMP reviewed during this encounter.       Pharmacotherapy: No side-effects or adverse reactions reported. Compliance: No problems identified. Effectiveness: Clinically acceptable.  Vickie Billow, RN  09/23/2020  8:25 AM  Sign when Signing Visit Nursing Pain Medication Assessment:  Safety precautions to be maintained throughout the outpatient stay will include: orient to surroundings, keep bed in low position, maintain call bell within reach at all times, provide assistance with transfer out of bed and ambulation.  Medication Inspection Compliance: Pill count conducted under aseptic conditions, in front of the patient. Neither the pills nor the bottle was removed from the patient's sight at any time. Once count was completed pills were immediately returned to the patient in their original bottle.  Medication: Buprenorphine (Suboxone) Pill/Patch Count: 0 of 60 films Pill/Patch Appearance: Markings consistent with prescribed medication Bottle Appearance: Standard pharmacy container. Clearly labeled. Filled Date: 02 / 21 / 2022 Last Medication intake:  Today    UDS:  Summary  Date Value Ref Range Status  04/02/2020 Note  Final    Comment:    ==================================================================== [employer]  13  (MW) ==================================================================== Test                             Result       Flag       Units  Drug Present and Declared for Prescription Verification   Buprenorphine                  12           EXPECTED   ng/mg creat   Norbuprenorphine               22           EXPECTED   ng/mg creat    Source of buprenorphine is a scheduled prescription medication.    Norbuprenorphine is an expected metabolite of buprenorphine.  ==================================================================== Test                      Result    Flag   Units      Ref Range   Creatinine              142              mg/dL      >=20 ==================================================================== Declared Medications:  The flagging and interpretation on this report are based on the  following declared medications.  Unexpected results may arise from  inaccuracies in the declared medications.   **Note: The testing scope of this panel does not include small to  moderate amounts of these reported medications:   Buprenorphine   **Note: The testing scope of this panel does not include the  following reported medications:   Amlodipine  Atorvastatin  Cholecalciferol  Eye Drop  Fluoxetine  Loratadine (Claritin)  Metformin  Montelukast  Olmesartan (Benicar)  Quetiapine (Seroquel) ==================================================================== For clinical consultation, please call [phone]. ====================================================================      ROS  Constitutional: Denies any fever or chills Gastrointestinal: No reported hemesis, hematochezia, vomiting, or acute GI distress Musculoskeletal: Improvement of right shoulder pain, low back pain after peripheral nerve stimulation Neurological: No reported episodes of acute onset apraxia, aphasia, dysarthria, agnosia, amnesia, paralysis, loss of coordination, or loss of  consciousness  Medication Review  Buprenorphine HCl, Cholecalciferol, Dextran 70-Hypromellose (PF), FLUoxetine, QUEtiapine Fumarate, amLODipine, atorvastatin, loratadine, metFORMIN, montelukast, olmesartan, and valACYclovir  History Review  Allergy: [name] is allergic to phenergan [promethazine hcl], codeine, cortisone, lidocaine, and oxycodone. Drug: Ms. Bellissimo  reports no history of drug use. Alcohol:  reports no history of alcohol use. Tobacco:  reports that she has never smoked. She has never used smokeless tobacco. Social: Ms. Hauswirth  reports that she has never smoked. She has never used smokeless tobacco. She reports that she does not drink alcohol and does not use drugs. 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 Surgical: Ms. Hagadorn  has a past surgical history that includes Endometrial ablation (01-2011); Shoulder surgery (Left, 07-1999, 07-2011); Cataract extraction, bilateral; Esophagogastroduodenoscopy (egd) with propofol (N/A, 06/08/2018); Colonoscopy with propofol (N/A, 06/08/2018); and SCS 8/ 2021. Family: family history includes Diabetes in her father; Hyperlipidemia in her father; Hypertension in her father.  Laboratory Chemistry Profile   Renal Lab Results  Component Value Date   BUN 22 (H) 12/08/2017   CREATININE 0.60 12/08/2017   GFRAA >60 12/08/2017   GFRNONAA >60 12/08/2017     Hepatic Lab Results  Component Value Date   AST 18 12/08/2017   ALT 14 12/08/2017   ALBUMIN 3.3 (L) 12/08/2017   ALKPHOS 75 12/08/2017     Electrolytes Lab Results  Component Value Date   NA 139  12/08/2017   K 3.1 (L) 12/08/2017   CL 103 12/08/2017   CALCIUM 8.9 12/08/2017     Bone No results found for: VD25OH, VD125OH2TOT, IN8676HM0, NO7096GE3, 25OHVITD1, 25OHVITD2, 25OHVITD3, TESTOFREE, TESTOSTERONE   Inflammation (CRP: Acute Phase) (ESR: Chronic Phase) No results found for: CRP, ESRSEDRATE, LATICACIDVEN     Note: Above Lab results reviewed.   Sprint axillary nerve lead removed with tip intact.  Physical Exam  General appearance: Well nourished, well developed, and well hydrated. In no apparent acute distress Mental status: Alert, oriented x 3 (person, place, & time)       Respiratory: No evidence of acute respiratory distress Eyes: PERLA Vitals: BP 118/77 (BP Location: Right Arm, Patient Position: Sitting, Cuff Size: Normal)   Pulse 72   Temp (!) 97 F (36.1 C) (Temporal)   Resp 16   Ht 5' 9"  (1.753 m)   Wt 236 lb (107 kg)   SpO2 100%   BMI 34.85 kg/m  BMI: Estimated body mass index is 34.85 kg/m as calculated from the following:   Height as of this encounter: 5' 9"  (1.753 m).   Weight as of this encounter: 236 lb (107 kg). Ideal: Ideal body weight: 66.2 kg (145 lb 15.1 oz) Adjusted ideal body weight: 82.5 kg (181 lb 15.5 oz)  Upper Extremity (UE) Exam    Side: Right upper extremity  Side: Left upper extremity  Skin & Extremity Inspection: Skin color, temperature, and hair growth are WNL. No peripheral edema or cyanosis. No masses, redness, swelling, asymmetry, or associated skin lesions. No contractures.  Skin & Extremity Inspection: Skin color, temperature, and hair growth are WNL. No peripheral edema or cyanosis. No masses, redness, swelling, asymmetry, or associated skin lesions. No contractures.  Functional ROM: Improved after treatment          Functional ROM: Unrestricted ROM          Muscle Tone/Strength: Functionally intact. No obvious neuro-muscular anomalies detected.  Muscle Tone/Strength: Functionally intact. No obvious neuro-muscular anomalies  detected.  Sensory (Neurological): Improved          Sensory (Neurological): Unimpaired          Palpation: No palpable anomalies              Palpation: No palpable anomalies              Provocative Test(s):  Phalen's test: deferred Tinel's test: deferred Apley's scratch test (touch opposite shoulder):  Action 1 (Across chest): Improved ROM Action 2 (Overhead): Improved ROM Action 3 (LB reach): Improved ROM   Provocative Test(s):  Phalen's test: deferred Tinel's test: deferred Apley's scratch test (touch opposite shoulder):  Action 1 (Across chest): deferred Action 2 (Overhead): deferred Action 3 (LB reach): deferred    Lumbar Spine Area Exam  Skin & Axial Inspection: No masses, redness, or swelling Alignment: Symmetrical Functional ROM: Pain restricted ROM affecting both sides Stability: No instability detected Muscle Tone/Strength: Functionally intact. No obvious neuro-muscular anomalies detected. Sensory (Neurological): Musculoskeletal pain pattern   Assessment   Status Diagnosis  Controlled Controlled Controlled 1. Myotonic muscular  dystrophy ([place])   2. 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 Spinal stenosis, lumbar region, with neurogenic claudication   4. Lumbar radiculopathy   5. Chronic pain syndrome      Plan of Care   [name] has a current medication list which includes the following long-term medication(s): amlodipine, atorvastatin, fluoxetine, loratadine, montelukast, and olmesartan.   Successful removal of right axillary Sprint peripheral nerve stimulator lead.  Tip intact.  Patient endorses approximately 100% pain relief for her right shoulder.  She is very pleased in fact she is doing an interview with Sprint to describe the pain relief that she has received with peripheral nerve stimulation therapies.  I will refill her belbuca as below.  No change in dose.  Follow-up in 3 months for medication management.  Pharmacotherapy (Medications Ordered): Meds  ordered this encounter  Medications  . Buprenorphine HCl (BELBUCA) 150 MCG FILM    Sig: Place 1 Film inside cheek every 12 (twelve) hours.    Dispense:  60 each    Refill:  2    For chronic pain syndrome    Follow-up plan:   Return in about 3 months (around 12/24/2020) for Medication Management, in person.     Status post L4-L5 ESI #1 on 10/16/2019, repeat.  Left L4 Sprint peripheral nerve stimulation 02/19/2020; removed 05/08/2020., right L4 Sprint peripheral nerve stimulation 03/13/2020, lead fracture, replaced 04/15/2020, right removed 06/20/2020.  Right axillary peripheral nerve stimulation 07/29/2020.       Recent Visits Date Type Provider Dept  07/29/20 Procedure visit Vickie Santa, MD [facility]  Showing recent visits within past 90 days and meeting all other requirements Today's Visits Date Type Provider Dept  09/23/20 Procedure visit Vickie Santa, MD [facility]  Showing today's visits and meeting all other requirements Future Appointments Date Type Provider Dept  12/17/20 Appointment Vickie Santa, MD [facility]  Showing future appointments within next 90 days and meeting all other requirements  I discussed the assessment and treatment plan with the patient. The patient was provided an opportunity to ask questions and all were answered. The patient agreed with the plan and demonstrated an understanding of the instructions.  Patient advised to call back or seek an in-person evaluation if the symptoms or condition worsens.  Duration of encounter: 30 minutes.  Note by: Vickie Santa, MD Date: 09/23/2020; Time: 9:17 AM

## 2020-09-23 NOTE — Progress Notes (Signed)
Nursing Pain Medication Assessment:  Safety precautions to be maintained throughout the outpatient stay will include: orient to surroundings, keep bed in low position, maintain call bell within reach at all times, provide assistance with transfer out of bed and ambulation.  Medication Inspection Compliance: Pill count conducted under aseptic conditions, in front of the patient. Neither the pills nor the bottle was removed from the patient's sight at any time. Once count was completed pills were immediately returned to the patient in their original bottle.  Medication: Buprenorphine (Suboxone) Pill/Patch Count: 0 of 60 films Pill/Patch Appearance: Markings consistent with prescribed medication Bottle Appearance: Standard pharmacy container. Clearly labeled. Filled Date: 02 / 21 / 2022 Last Medication intake:  Today

## 2020-09-27 ENCOUNTER — Other Ambulatory Visit: Payer: Federal, State, Local not specified - PPO

## 2020-10-21 ENCOUNTER — Other Ambulatory Visit: Payer: Self-pay

## 2020-10-21 ENCOUNTER — Ambulatory Visit
Admission: RE | Admit: 2020-10-21 | Discharge: 2020-10-21 | Disposition: A | Discharge status: Home / Self Care | Payer: Medicare PPO | Source: Ambulatory Visit | Attending: Nurse Practitioner | Admitting: Nurse Practitioner

## 2020-10-21 DIAGNOSIS — Z1231 Encounter for screening mammogram for malignant neoplasm of breast: Secondary | ICD-10-CM | POA: Diagnosis not present

## 2020-12-17 ENCOUNTER — Encounter: Payer: Medicare PPO | Admitting: Student in an Organized Health Care Education/Training Program

## 2020-12-26 ENCOUNTER — Encounter: Payer: Medicare PPO | Admitting: Student in an Organized Health Care Education/Training Program

## 2020-12-26 ENCOUNTER — Ambulatory Visit: Payer: Medicare PPO | Admitting: Dermatology

## 2020-12-31 ENCOUNTER — Other Ambulatory Visit: Payer: Self-pay

## 2020-12-31 ENCOUNTER — Ambulatory Visit
Payer: Medicare PPO | Attending: Student in an Organized Health Care Education/Training Program | Admitting: Student in an Organized Health Care Education/Training Program

## 2020-12-31 ENCOUNTER — Encounter: Payer: Self-pay | Admitting: Student in an Organized Health Care Education/Training Program

## 2020-12-31 VITALS — BP 129/89 | HR 76 | Temp 97.0°F | Resp 16 | Ht 69.0 in | Wt 230.0 lb

## 2020-12-31 DIAGNOSIS — G894 Chronic pain syndrome: Secondary | ICD-10-CM

## 2020-12-31 DIAGNOSIS — E7402 Pompe disease: Secondary | ICD-10-CM | POA: Insufficient documentation

## 2020-12-31 DIAGNOSIS — G7111 Myotonic muscular dystrophy: Secondary | ICD-10-CM | POA: Insufficient documentation

## 2020-12-31 DIAGNOSIS — M48062 Spinal stenosis, lumbar region with neurogenic claudication: Secondary | ICD-10-CM | POA: Diagnosis not present

## 2020-12-31 DIAGNOSIS — M5416 Radiculopathy, lumbar region: Secondary | ICD-10-CM | POA: Insufficient documentation

## 2020-12-31 MED ORDER — BELBUCA 150 MCG BU FILM: 1.0000 | ORAL_FILM | Freq: Two times a day (BID) | BUCCAL | 2 refills | Status: DC

## 2020-12-31 NOTE — Progress Notes (Signed)
Nursing Pain Medication Assessment:  Safety precautions to be maintained throughout the outpatient stay will include: orient to surroundings, keep bed in low position, maintain call bell within reach at all times, provide assistance with transfer out of bed and ambulation.  Medication Inspection Compliance: Ms. Todisco did not comply with our request to bring her pills to be counted. She was reminded that bringing the medication bottles, even when empty, is a requirement.  Medication: None brought in. Pill/Patch Count: None available to be counted. Bottle Appearance: No container available. Did not bring bottle(s) to appointment. Filled Date: N/A Last Medication intake:  Today

## 2020-12-31 NOTE — Progress Notes (Signed)
PROVIDER NOTE: Information contained herein reflects review and annotations entered in association with encounter. Interpretation of such information and data should be left to medically-trained personnel. Information provided to patient can be located elsewhere in the medical record under "Patient Instructions". Document created using STT-dictation technology, any transcriptional errors that may result from process are unintentional.    Patient: Vickie Stevens  Service Category: E/M  Provider: Gillis Santa, MD  DOB: [birth year]  DOS: 12/31/2020  Specialty: Interventional Pain Management  MRN: [id number]  Setting: Ambulatory outpatient  PCP: Jodi Marble, MD  Type: Established Patient    Referring Provider: Jodi Marble, MD  Location: Office  Delivery: Face-to-face     HPI  [name], a 50 y.o. 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 Ms. Ohmer primary complain today is Neck Pain (left) and Shoulder Pain (left) Last encounter: My last encounter with her was on 08/28/2020. Pertinent problems: Ms. 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 Pain Assessment: Severity of Chronic pain is reported as a 6 /10. Location: Neck (GENERALIZED, LEFT SIDED, BODY PAIN) Posterior, Left/TO LEFT SHOULDER; THEN LEFT ELBOW DOWN TO HAND. Onset: More than a month ago. Quality: Aching, Burning. Timing: Constant. Modifying factor(s): MEDS. Vitals:  height is _0  (1.753 m) and weight is 230 lb (104.3 kg). Her temporal temperature is 97 F (36.1 C) (abnormal). Her blood pressure is 129/89 and her pulse is 76. Her respiration is 16 and oxygen saturation is 100%.   Reason for encounter:    Patient presents today for medication management.  She returned yesterday from [place].  She was 1 month.  She contracted COVID there and states that is Jabier Mutton.  She states that she has had trouble breathing, shortness of breath, upper respiratory symptoms increased congestion for the last 3 weeks and her symptoms are typically better approximately 3 days ago.  She has an appointment with her PCP tomorrow for post-COVID care.  She was unable to get her belbuca fill prior to going to [place].  As a result she has been using half a patch, 75 mcg twice daily.  This has resulted in suboptimal pain management.  I will refill her belbuca patch as below.  Patient continues to endorse significant pain relief in regards to her low back pain after her splint peripheral nerve stimulation of lumbar medial branch.  She also endorses pain relief of her right shoulder pain after Sprint peripheral nerve stimulation however states that it she is having increased episodes of pain that he has been due to overexertion.  Pharmacotherapy Assessment   Analgesic: Belbuca 150 mcg twice daily    Monitoring: [place] PMP: PDMP reviewed during this encounter.       Pharmacotherapy: No side-effects or adverse reactions reported. Compliance: No problems identified. Effectiveness: Clinically acceptable.  Rise Patience, RN  12/31/2020  2:22 PM  Sign when Signing Visit Nursing Pain Medication Assessment:  Safety precautions to be maintained throughout the outpatient stay will include: orient to surroundings, keep bed in low position, maintain call bell within reach at all times, provide assistance with transfer out of bed and ambulation.  Medication Inspection Compliance: Ms. Liming did not comply with our request to bring her pills to be counted. She was reminded that bringing  the medication bottles, even when empty, is a requirement.  Medication: None brought in. Pill/Patch Count: None available to be counted. Bottle Appearance: No  container available. Did not bring bottle(s) to appointment. Filled Date: N/A Last Medication intake:  Today      UDS:  Summary  Date Value Ref Range Status  04/02/2020 Note  Final    Comment:    ==================================================================== [facility] Select 13 (MW) ==================================================================== Test                             Result       Flag       Units  Drug Present and Declared for Prescription Verification   Buprenorphine                  12           EXPECTED   ng/mg creat   Norbuprenorphine               22           EXPECTED   ng/mg creat    Source of buprenorphine is a scheduled prescription medication.    Norbuprenorphine is an expected metabolite of buprenorphine.  ==================================================================== Test                      Result    Flag   Units      Ref Range   Creatinine              142              mg/dL      >=20 ==================================================================== Declared Medications:  The flagging and interpretation on this report are based on the  following declared medications.  Unexpected results may arise from  inaccuracies in the declared medications.   **Note: The testing scope of this panel does not include small to  moderate amounts of these reported medications:   Buprenorphine   **Note: The testing scope of this panel does not include the  following reported medications:   Amlodipine  Atorvastatin  Cholecalciferol  Eye Drop  Fluoxetine  Loratadine (Claritin)  Metformin  Montelukast  Olmesartan (Benicar)  Quetiapine (Seroquel) ==================================================================== For clinical consultation, please call [phone]. ====================================================================      ROS  Constitutional: Denies any fever or chills Gastrointestinal: No reported hemesis, hematochezia,  vomiting, or acute GI distress Musculoskeletal:  Improvement of right shoulder pain, low back pain after peripheral nerve stimulation Neurological: No reported episodes of acute onset apraxia, aphasia, dysarthria, agnosia, amnesia, paralysis, loss of coordination, or loss of consciousness  Medication Review  Buprenorphine HCl, Cholecalciferol, Dextran 70-Hypromellose (PF), amLODipine, atorvastatin, loratadine, montelukast, and olmesartan  History Review  Allergy: Ms. Grinnell is allergic to phenergan [promethazine hcl], codeine, cortisone, lidocaine, and oxycodone. Drug: Ms. Mole  reports no history of drug use. Alcohol:  reports no history of alcohol use. Tobacco:  reports that she has never smoked. She has never used smokeless tobacco. Social: Ms. Ferran  reports that she has never smoked. She has never used smokeless tobacco. She reports that she does not drink alcohol and does not use drugs. 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 Surgical: Ms. Ryles  has a past surgical history that includes Endometrial ablation (01-2011); Shoulder surgery (Left, 07-1999, 07-2011); Cataract extraction, bilateral; Esophagogastroduodenoscopy (egd) with propofol (N/A, 06/08/2018); Colonoscopy with propofol (N/A, 06/08/2018); and SCS 8/ 2021. Family: family history includes Diabetes in her father; Hyperlipidemia in her father; Hypertension in her father.  Laboratory Chemistry Profile   Renal Lab Results  Component Value Date   BUN 22 (H) 12/08/2017   CREATININE 0.60 12/08/2017   GFRAA >60 12/08/2017   GFRNONAA >60 12/08/2017      Hepatic Lab Results  Component Value Date   AST 18 12/08/2017   ALT 14 12/08/2017   ALBUMIN 3.3 (L) 12/08/2017   ALKPHOS 75 12/08/2017     Electrolytes Lab Results  Component Value Date   NA 139 12/08/2017   K 3.1 (L) 12/08/2017   CL 103 12/08/2017   CALCIUM 8.9 12/08/2017     Bone No results found for: VD25OH, VD125OH2TOT, HF0263ZC5, YI5027XA1, 25OHVITD1, 25OHVITD2, 25OHVITD3, TESTOFREE, TESTOSTERONE   Inflammation (CRP: Acute Phase) (ESR: Chronic Phase) No results found for: CRP, ESRSEDRATE, LATICACIDVEN     Note: Above Lab results reviewed.   Sprint axillary nerve lead removed with tip intact.  Physical Exam  General appearance: Well nourished, well developed, and well hydrated. In no apparent acute distress Mental status: Alert, oriented x 3 (person, place, & time)       Respiratory: No evidence of acute respiratory distress Eyes: PERLA Vitals: BP 129/89 (BP Location: Left Arm, Patient Position: Sitting, Cuff Size: Normal)   Pulse 76   Temp (!) 97 F (36.1 C) (Temporal)   Resp 16   Ht _0  (1.753 m)   Wt 230 lb (104.3 kg)   SpO2 100%   BMI 33.97 kg/m  BMI: Estimated body mass index is 33.97 kg/m as calculated from the following:   Height as of this encounter: _1  (1.753 m).   Weight as of this encounter: 230 lb (104.3 kg). Ideal: Ideal body weight: 66.2 kg (145 lb 15.1 oz) Adjusted ideal body weight: 81.5 kg (179 lb 9.1 oz)  Upper Extremity (UE) Exam    Side: Right upper extremity  Side: Left upper extremity  Skin & Extremity Inspection: Skin color, temperature, and hair growth are WNL. No peripheral edema or cyanosis. No masses, redness, swelling, asymmetry, or associated skin lesions. No contractures.  Skin & Extremity Inspection: Skin color, temperature, and hair growth are WNL. No peripheral edema or cyanosis. No masses, redness, swelling, asymmetry, or associated skin lesions. No contractures.  Functional ROM: Improved after treatment           Functional ROM: Unrestricted ROM          Muscle Tone/Strength: Functionally intact. No obvious neuro-muscular anomalies detected.  Muscle Tone/Strength: Functionally intact. No obvious neuro-muscular anomalies detected.  Sensory (Neurological): Improved          Sensory (Neurological): Unimpaired          Palpation: No palpable anomalies              Palpation: No palpable anomalies              Provocative Test(s):  Phalen's test: deferred Tinel's test: deferred Apley's scratch test (touch opposite shoulder):  Action 1 (Across chest): Improved ROM Action 2 (Overhead): Improved ROM Action 3 (LB reach): Improved ROM   Provocative Test(s):  Phalen's test: deferred Tinel's test: deferred Apley's scratch test (touch opposite shoulder):  Action 1 (Across chest): deferred Action 2 (Overhead):  deferred Action 3 (LB reach): deferred    Lumbar Spine Area Exam  Skin & Axial Inspection: No masses, redness, or swelling Alignment: Symmetrical Functional ROM: Pain restricted ROM affecting both sides Stability: No instability detected Muscle Tone/Strength: Functionally intact. No obvious neuro-muscular anomalies detected. Sensory (Neurological): Musculoskeletal pain pattern   Assessment   Status Diagnosis  Controlled Controlled Controlled 1. 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 Spinal stenosis, lumbar region, with neurogenic claudication   4. Lumbar radiculopathy   5. Chronic pain syndrome       Plan of Care   [name] has a current medication list which includes the following long-term medication(s): amlodipine, atorvastatin, loratadine, montelukast, and olmesartan.   Pharmacotherapy (Medications Ordered): Meds ordered this encounter  Medications   Buprenorphine HCl (BELBUCA) 150 MCG FILM    Sig: Place 1 Film inside cheek every 12 (twelve) hours.    Dispense:  60 each    Refill:  2    For chronic pain syndrome     Follow-up plan:    Return in about 3 months (around 04/02/2021) for Medication Management, in person.     Status post L4-L5 ESI #1 on 10/16/2019, repeat.  Left L4 Sprint peripheral nerve stimulation 02/19/2020; removed 05/08/2020., right L4 Sprint peripheral nerve stimulation 03/13/2020, lead fracture, replaced 04/15/2020, right removed 06/20/2020.  Right axillary peripheral nerve stimulation 07/29/2020.       Recent Visits No visits were found meeting these conditions. Showing recent visits within past 90 days and meeting all other requirements Today's Visits Date Type Provider Dept  12/31/20 Office Visit Gillis Santa, MD [facility]  Showing today's visits and meeting all other requirements Future Appointments No visits were found meeting these conditions. Showing future appointments within next 90 days and meeting all other requirements I discussed the assessment and treatment plan with the patient. The patient was provided an opportunity to ask questions and all were answered. The patient agreed with the plan and demonstrated an understanding of the instructions.  Patient advised to call back or seek an in-person evaluation if the symptoms or condition worsens.  Duration of encounter: 30 minutes.  Note by: Gillis Santa, MD Date: 12/31/2020; Time: 2:40 PM

## 2021-01-01 DIAGNOSIS — B354 Tinea corporis: Secondary | ICD-10-CM | POA: Diagnosis not present

## 2021-01-01 DIAGNOSIS — E119 Type 2 diabetes mellitus without complications: Secondary | ICD-10-CM | POA: Diagnosis not present

## 2021-01-01 DIAGNOSIS — B009 Herpesviral infection, unspecified: Secondary | ICD-10-CM | POA: Diagnosis not present

## 2021-01-01 DIAGNOSIS — U071 COVID-19: Secondary | ICD-10-CM | POA: Diagnosis not present

## 2021-01-01 DIAGNOSIS — E782 Mixed hyperlipidemia: Secondary | ICD-10-CM | POA: Diagnosis not present

## 2021-01-01 DIAGNOSIS — I10 Essential (primary) hypertension: Secondary | ICD-10-CM | POA: Diagnosis not present

## 2021-01-01 DIAGNOSIS — M7581 Other shoulder lesions, right shoulder: Secondary | ICD-10-CM | POA: Diagnosis not present

## 2021-01-01 DIAGNOSIS — G901 Familial dysautonomia [Riley-Day]: Secondary | ICD-10-CM | POA: Diagnosis not present

## 2021-01-01 DIAGNOSIS — E162 Hypoglycemia, unspecified: Secondary | ICD-10-CM | POA: Diagnosis not present

## 2021-01-01 DIAGNOSIS — E875 Hyperkalemia: Secondary | ICD-10-CM | POA: Diagnosis not present

## 2021-01-01 DIAGNOSIS — F329 Major depressive disorder, single episode, unspecified: Secondary | ICD-10-CM | POA: Diagnosis not present

## 2021-01-02 ENCOUNTER — Encounter: Payer: Medicare PPO | Admitting: Student in an Organized Health Care Education/Training Program

## 2021-01-16 DIAGNOSIS — E119 Type 2 diabetes mellitus without complications: Secondary | ICD-10-CM | POA: Diagnosis not present

## 2021-01-22 DIAGNOSIS — G4733 Obstructive sleep apnea (adult) (pediatric): Secondary | ICD-10-CM | POA: Diagnosis not present

## 2021-02-13 DIAGNOSIS — F4310 Post-traumatic stress disorder, unspecified: Secondary | ICD-10-CM | POA: Diagnosis not present

## 2021-02-13 DIAGNOSIS — D2371 Other benign neoplasm of skin of right lower limb, including hip: Secondary | ICD-10-CM | POA: Diagnosis not present

## 2021-02-13 DIAGNOSIS — F411 Generalized anxiety disorder: Secondary | ICD-10-CM | POA: Diagnosis not present

## 2021-02-13 DIAGNOSIS — L304 Erythema intertrigo: Secondary | ICD-10-CM | POA: Diagnosis not present

## 2021-02-13 DIAGNOSIS — F0632 Mood disorder due to known physiological condition with major depressive-like episode: Secondary | ICD-10-CM | POA: Diagnosis not present

## 2021-02-13 DIAGNOSIS — B351 Tinea unguium: Secondary | ICD-10-CM | POA: Diagnosis not present

## 2021-02-13 DIAGNOSIS — L810 Postinflammatory hyperpigmentation: Secondary | ICD-10-CM | POA: Diagnosis not present

## 2021-02-14 DIAGNOSIS — E119 Type 2 diabetes mellitus without complications: Secondary | ICD-10-CM | POA: Diagnosis not present

## 2021-02-14 DIAGNOSIS — E782 Mixed hyperlipidemia: Secondary | ICD-10-CM | POA: Diagnosis not present

## 2021-02-14 DIAGNOSIS — I10 Essential (primary) hypertension: Secondary | ICD-10-CM | POA: Diagnosis not present

## 2021-02-17 DIAGNOSIS — F329 Major depressive disorder, single episode, unspecified: Secondary | ICD-10-CM | POA: Diagnosis not present

## 2021-02-17 DIAGNOSIS — E782 Mixed hyperlipidemia: Secondary | ICD-10-CM | POA: Diagnosis not present

## 2021-02-17 DIAGNOSIS — E119 Type 2 diabetes mellitus without complications: Secondary | ICD-10-CM | POA: Diagnosis not present

## 2021-02-17 DIAGNOSIS — G901 Familial dysautonomia [Riley-Day]: Secondary | ICD-10-CM | POA: Diagnosis not present

## 2021-02-17 DIAGNOSIS — B009 Herpesviral infection, unspecified: Secondary | ICD-10-CM | POA: Diagnosis not present

## 2021-02-17 DIAGNOSIS — E875 Hyperkalemia: Secondary | ICD-10-CM | POA: Diagnosis not present

## 2021-02-17 DIAGNOSIS — B354 Tinea corporis: Secondary | ICD-10-CM | POA: Diagnosis not present

## 2021-02-17 DIAGNOSIS — I10 Essential (primary) hypertension: Secondary | ICD-10-CM | POA: Diagnosis not present

## 2021-02-17 DIAGNOSIS — M7581 Other shoulder lesions, right shoulder: Secondary | ICD-10-CM | POA: Diagnosis not present

## 2021-02-18 ENCOUNTER — Encounter: Payer: Self-pay | Admitting: Dermatology

## 2021-02-19 ENCOUNTER — Ambulatory Visit: Payer: Medicare PPO | Admitting: Dermatology

## 2021-02-25 ENCOUNTER — Encounter: Payer: Self-pay | Admitting: Student in an Organized Health Care Education/Training Program

## 2021-02-28 DIAGNOSIS — G4733 Obstructive sleep apnea (adult) (pediatric): Secondary | ICD-10-CM | POA: Diagnosis not present

## 2021-03-04 ENCOUNTER — Encounter: Payer: Self-pay | Admitting: Student in an Organized Health Care Education/Training Program

## 2021-03-04 DIAGNOSIS — F0632 Mood disorder due to known physiological condition with major depressive-like episode: Secondary | ICD-10-CM | POA: Diagnosis not present

## 2021-03-04 DIAGNOSIS — F411 Generalized anxiety disorder: Secondary | ICD-10-CM | POA: Diagnosis not present

## 2021-03-04 DIAGNOSIS — F4310 Post-traumatic stress disorder, unspecified: Secondary | ICD-10-CM | POA: Diagnosis not present

## 2021-03-25 ENCOUNTER — Encounter: Payer: Medicare PPO | Admitting: Student in an Organized Health Care Education/Training Program

## 2021-03-27 ENCOUNTER — Ambulatory Visit (INDEPENDENT_AMBULATORY_CARE_PROVIDER_SITE_OTHER): Payer: Medicare PPO | Admitting: Dermatology

## 2021-03-27 ENCOUNTER — Ambulatory Visit
Payer: Medicare PPO | Attending: Student in an Organized Health Care Education/Training Program | Admitting: Student in an Organized Health Care Education/Training Program

## 2021-03-27 ENCOUNTER — Other Ambulatory Visit: Payer: Self-pay

## 2021-03-27 ENCOUNTER — Encounter: Payer: Self-pay | Admitting: Student in an Organized Health Care Education/Training Program

## 2021-03-27 VITALS — BP 116/96 | HR 86 | Temp 97.0°F | Resp 18 | Ht 69.0 in | Wt 228.0 lb

## 2021-03-27 DIAGNOSIS — M48062 Spinal stenosis, lumbar region with neurogenic claudication: Secondary | ICD-10-CM | POA: Diagnosis not present

## 2021-03-27 DIAGNOSIS — G7111 Myotonic muscular dystrophy: Secondary | ICD-10-CM

## 2021-03-27 DIAGNOSIS — M12811 Other specific arthropathies, not elsewhere classified, right shoulder: Secondary | ICD-10-CM | POA: Diagnosis not present

## 2021-03-27 DIAGNOSIS — M19011 Primary osteoarthritis, right shoulder: Secondary | ICD-10-CM | POA: Diagnosis not present

## 2021-03-27 DIAGNOSIS — M5416 Radiculopathy, lumbar region: Secondary | ICD-10-CM

## 2021-03-27 DIAGNOSIS — M25511 Pain in right shoulder: Secondary | ICD-10-CM

## 2021-03-27 DIAGNOSIS — M75101 Unspecified rotator cuff tear or rupture of right shoulder, not specified as traumatic: Secondary | ICD-10-CM | POA: Diagnosis not present

## 2021-03-27 DIAGNOSIS — G894 Chronic pain syndrome: Secondary | ICD-10-CM | POA: Diagnosis not present

## 2021-03-27 DIAGNOSIS — L304 Erythema intertrigo: Secondary | ICD-10-CM | POA: Diagnosis not present

## 2021-03-27 DIAGNOSIS — L720 Epidermal cyst: Secondary | ICD-10-CM

## 2021-03-27 DIAGNOSIS — D1800 Hemangioma unspecified site: Secondary | ICD-10-CM | POA: Diagnosis not present

## 2021-03-27 DIAGNOSIS — G8929 Other chronic pain: Secondary | ICD-10-CM | POA: Diagnosis present

## 2021-03-27 DIAGNOSIS — E7402 Pompe disease: Secondary | ICD-10-CM | POA: Diagnosis not present

## 2021-03-27 DIAGNOSIS — D239 Other benign neoplasm of skin, unspecified: Secondary | ICD-10-CM

## 2021-03-27 DIAGNOSIS — L309 Dermatitis, unspecified: Secondary | ICD-10-CM

## 2021-03-27 DIAGNOSIS — D2371 Other benign neoplasm of skin of right lower limb, including hip: Secondary | ICD-10-CM | POA: Diagnosis not present

## 2021-03-27 MED ORDER — KETOCONAZOLE 2 % EX CREA: 1.0000 "application " | TOPICAL_CREAM | Freq: Two times a day (BID) | CUTANEOUS | 2 refills | Status: AC

## 2021-03-27 MED ORDER — HYDROCORTISONE 2.5 % EX CREA: TOPICAL_CREAM | Freq: Two times a day (BID) | CUTANEOUS | 2 refills | Status: DC | PRN

## 2021-03-27 MED ORDER — BELBUCA 150 MCG BU FILM: 1.0000 | ORAL_FILM | Freq: Two times a day (BID) | BUCCAL | 5 refills | Status: DC

## 2021-03-27 NOTE — Patient Instructions (Addendum)
Intertrigo is a chronic recurrent rash that occurs in skin fold areas that may be associated with friction; heat; moisture; yeast; fungus; and bacteria.  It is exacerbated by increased movement / activity; sweating; and higher atmospheric temperature.  Start ketoconazole 2% cream twice a day as needed for rash.  Start HC 2.5% cream twice a day for up to 2-3 weeks as needed for rash.   May also use a blow dryer after showering, can apply Ban roll on deodorant.   Start [facility] Iodoquinol 1%, Hydrocortisone 2.5%, Niacinamide 2% Cream twice a day to affected areas for up to two weeks.  The patient was advised this is not covered by insurance since it is made by a compounding pharmacy. They will receive an email to check out and the medication will be mailed to their home.   Instructions for [facility] Medications  One or more of your medications was sent to the [facility] mail order compounding pharmacy. You will receive an email from them and can purchase the medicine through that link. It will then be mailed to your home at the address you confirmed. If for any reason you do not receive an email from them, please check your spam folder. If you still do not find the email, please let us know. [facility] phone number is [phone].  Start TMC 0.1% cream twice daily to hands for up to 2 weeks as needed. Avoid applying to face, groin, and axilla. Use as directed. Risk of skin atrophy with long-term use reviewed.   Topical steroids (such as triamcinolone, fluocinolone, fluocinonide, mometasone, clobetasol, halobetasol, betamethasone, hydrocortisone) can cause thinning and lightening of the skin if they are used for too long in the same area. Your physician has selected the right strength medicine for your problem and area affected on the body. Please use your medication only as directed by your physician to prevent side effects.   If you have any questions or concerns for your  doctor, please call our main line at [phone] and press option 4 to reach your doctor's medical assistant. If no one answers, please leave a voicemail as directed and we will return your call as soon as possible. Messages left after 4 pm will be answered the following business day.   You may also send [place] a message via [place]. We typically respond to MyChart messages within 1-2 business days.  For prescription refills, please ask your pharmacy to contact our office. Our fax number is [phone].  If you have an urgent issue when the clinic is closed that cannot wait until the next business day, you can page your doctor at the number below.    Please note that while we do our best to be available for urgent issues outside of office hours, we are not available 24/7.   If you have an urgent issue and are unable to reach [place], you may choose to seek medical care at your doctor's office, retail clinic, urgent care center, or emergency room.  If you have a medical emergency, please immediately call 911 or go to the emergency department.  Pager Numbers  - Dr. Nehemiah Massed: [phone]  - Dr. Laurence Ferrari: [phone]  - Dr. Nicole Kindred: [phone]  In the event of inclement weather, please call our main line at [phone] for an update on the status of any delays or closures.  Dermatology Medication Tips: Please keep the boxes that topical medications come in in order to help keep track of the instructions about where and  how to use these. Pharmacies typically print the medication instructions only on the boxes and not directly on the medication tubes.   If your medication is too expensive, please contact our office at [phone] option 4 or send [place] a message through [place].   We are unable to tell what your co-pay for medications will be in advance as this is different depending on your insurance coverage. However, we may be able to find a substitute medication at lower cost or fill out paperwork  to get insurance to cover a needed medication.   If a prior authorization is required to get your medication covered by your insurance company, please allow [place] 1-2 business days to complete this process.  Drug prices often vary depending on where the prescription is filled and some pharmacies may offer cheaper prices.  The website www.goodrx.com contains coupons for medications through different pharmacies. The prices here do not account for what the cost may be with help from insurance (it may be cheaper with your insurance), but the website can give you the price if you did not use any insurance.  - You can print the associated coupon and take it with your prescription to the pharmacy.  - You may also stop by our office during regular business hours and pick up a GoodRx coupon card.  - If you need your prescription sent electronically to a different pharmacy, notify our office through [facility] or by phone at [phone] option 4.

## 2021-03-27 NOTE — Progress Notes (Signed)
Follow-Up Visit   Subjective  Vickie Stevens is a 50 y.o. female who presents for the following: Rash (Patient here today for a rash under breasts and at groin. She is using TMC 0.1% cream and nystatin cream prescribed by another dermatologist. Patient was told to use for 5 days then to switch to Zeasorb powder. Patient advises that the rx creams helped but powder seems to make itch worse. ).   The following portions of the chart were reviewed this encounter and updated as appropriate:   Tobacco  Allergies  Meds  Problems  Med Hx  Surg Hx  Fam Hx      Review of Systems:  No other skin or systemic complaints except as noted in HPI or Assessment and Plan.  Objective  Well appearing patient in no apparent distress; mood and affect are within normal limits.  A focused examination was performed including inframammary, groin. Relevant physical exam findings are noted in the Assessment and Plan.  inframammary, inguinal folds Erythematous hyperpigmented patches inframammary and inguinal folds  bilateral hands Clear today  Right Lower Leg Firm pink/brown papulenodule with dimple sign.   Right Mid Back Subcutaneous nodule.    Assessment & Plan  Erythema intertrigo inframammary, inguinal folds  Chronic condition with duration or expected duration over one year. Condition is bothersome to patient. Currently flared.  Intertrigo is a chronic recurrent rash that occurs in skin fold areas that may be associated with friction; heat; moisture; yeast; fungus; and bacteria.  It is exacerbated by increased movement / activity; sweating; and higher atmospheric temperature.  Start ketoconazole 2% cream twice a day as needed for rash.  Start HC 2.5% cream twice a day for up to 2-3 weeks as needed for rash.   May also use a blow dryer after showering, can apply Ban roll on deodorant.   If not cleared with ketoconazole and hydroquinone, Start [facility] Iodoquinol 1%, Hydrocortisone  2.5%, Niacinamide 2% Cream twice a day to affected areas for up to two weeks.  The patient was advised this is not covered by insurance since it is made by a compounding pharmacy. They will receive an email to check out and the medication will be mailed to their home.     ketoconazole (NIZORAL) 2 % cream - inframammary, inguinal folds Apply 1 application topically 2 (two) times daily.  hydrocortisone 2.5 % cream - inframammary, inguinal folds Apply topically 2 (two) times daily as needed (Rash).  Hand dermatitis bilateral hands  Start TMC 0.1% cream twice daily to hands for up to 2 weeks as needed. Avoid applying to face, groin, and axilla. Use as directed. Risk of skin atrophy with long-term use reviewed.   Topical steroids (such as triamcinolone, fluocinolone, fluocinonide, mometasone, clobetasol, halobetasol, betamethasone, hydrocortisone) can cause thinning and lightening of the skin if they are used for too long in the same area. Your physician has selected the right strength medicine for your problem and area affected on the body. Please use your medication only as directed by your physician to prevent side effects.    Dermatofibroma Right Lower Leg  Benign-appearing.  Observation.  Call clinic for new or changing lesions.    Epidermal inclusion cyst Right Mid Back  Benign-appearing. Exam most consistent with an epidermal inclusion cyst. Discussed that a cyst is a benign growth that can grow over time and sometimes get irritated or inflamed. Recommend observation if it is not bothersome. Discussed option of surgical excision to remove it if it is  growing, symptomatic, or other changes noted. Please call for new or changing lesions so they can be evaluated.   Hemangiomas - Red papules - Discussed benign nature - Observe - Call for any changes  Return if symptoms worsen or fail to improve.  Graciella Belton, RMA, am acting as scribe for Forest Gleason, MD .  Documentation: I  have reviewed the above documentation for accuracy and completeness, and I agree with the above.  Forest Gleason, MD

## 2021-03-27 NOTE — Progress Notes (Signed)
Nursing Pain Medication Assessment:  Safety precautions to be maintained throughout the outpatient stay will include: orient to surroundings, keep bed in low position, maintain call bell within reach at all times, provide assistance with transfer out of bed and ambulation.  Medication Inspection Compliance: Pill count conducted under aseptic conditions, in front of the patient. Neither the pills nor the bottle was removed from the patient's sight at any time. Once count was completed pills were immediately returned to the patient in their original bottle.  Medication: Buprenorphine (Suboxone) Pill/Patch Count:  0 of 60  pills remain Pill/Patch Appearance: Markings consistent with prescribed medication Bottle Appearance: Standard pharmacy container. Clearly labeled. Filled Date: 08 / 24 / 2022 Last Medication intake:  Today

## 2021-03-27 NOTE — Progress Notes (Signed)
PROVIDER NOTE: Information contained herein reflects review and annotations entered in association with encounter. Interpretation of such information and data should be left to medically-trained personnel. Information provided to patient can be located elsewhere in the medical record under "Patient Instructions". Document created using STT-dictation technology, any transcriptional errors that may result from process are unintentional.    Patient: Vickie Stevens  Service Category: E/M  Provider:  , MD  DOB: 08/02/1970  DOS: 03/27/2021  Specialty: Interventional Pain Management  MRN: [id number]  Setting: Ambulatory outpatient  PCP: Tejan-Sie, S Ahmed, MD  Type: Established Patient    Referring Provider: Tejan-Sie, S Ahmed, MD  Location: Office  Delivery: Face-to-face     HPI  [name], a 50 y.o. 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 Ms. Santa's primary complain today is Shoulder Pain Last encounter: My last encounter with her was on 12/31/2020. Pertinent problems: Ms. Gupta has DDD (degenerative disc disease), cervical; Rotator cuff (capsule) sprain Tear of rotator cuff; Lumbar and sacral arthritis; Bilateral occipital neuralgia; Myotonic muscular dystrophy (HCC); Myotonic dystrophy (HCC); Chronic pain syndrome; and Lumbar facet arthropathy on their pertinent problem list. Pain Assessment: Severity of Chronic pain is reported as a 4 /10. Location: Shoulder Left, Right/radiates from shoulders down to leg. Onset: More than a month ago. Quality: Stabbing, Burning. Timing: Constant. Modifying factor(s): heat. Vitals:  height is 5' 9" (1.753 m) and weight is 228 lb (103.4 kg). Her temperature is 97 F (36.1 C) (abnormal). Her blood pressure is 116/96 (abnormal) and her pulse is 86. Her respiration is 18 and oxygen saturation is 100%.   Reason for encounter:   Patient went to muscular dystrophy conference last week in [place]  where she had a speaking engagement States that she was published in the [facility] patient story Refill of buprenorphine  Overall she states she is doing well.  ROS  Constitutional: Denies any fever or chills Gastrointestinal: No reported hemesis, hematochezia, vomiting, or acute GI distress Musculoskeletal: low back pain Neurological: No reported episodes of acute onset apraxia, aphasia, dysarthria, agnosia, amnesia, paralysis, loss of coordination, or loss of consciousness  Medication Review  Buprenorphine HCl, Cholecalciferol, Dextran 70-Hypromellose (PF), amLODipine, atorvastatin, loratadine, montelukast, and olmesartan  History Review  Allergy: Ms. Shallenberger is allergic to phenergan [promethazine hcl], codeine, cortisone, lidocaine, and oxycodone. Drug: Ms. Clyatt  reports no history of drug use. Alcohol:  reports no history of alcohol use. Tobacco:  reports that she has never smoked. She has never used smokeless tobacco. Social: Ms. Godina  reports that she has never smoked. She has never used smokeless tobacco. She reports that she does not drink alcohol and does not use drugs. Medical:  has a past medical history of ADHD (attention deficit hyperactivity disorder), Biceps tendonitis, Bipolar disorder (HCC), Chronic back pain, Cluster headache, Crohn's disease (HCC), DDD (degenerative disc disease), lumbar, Depression, Diabetes mellitus without complication (HCC), Hypercholesteremia, Hypertension, PCOS (polycystic ovarian syndrome), Rotator cuff tendinitis, Sleep apnea, and Tuberous sclerosis (HCC). Surgical: Ms. Bossier  has a past surgical history that includes Endometrial ablation (01-2011); Shoulder surgery (Left, 07-1999, 07-2011); Cataract extraction, bilateral; Esophagogastroduodenoscopy (egd) with propofol (N/A, 06/08/2018); Colonoscopy with propofol (N/A, 06/08/2018); and SCS 8/ 2021. Family: family history includes Diabetes in her father; Hyperlipidemia in her father; Hypertension  in her father.  Laboratory Chemistry Profile   Renal Lab Results  Component Value Date   BUN 22 (H) 12/08/2017   CREATININE 0.60 12/08/2017   GFRAA >  60 12/08/2017   GFRNONAA >60 12/08/2017     Hepatic Lab Results  Component Value Date   AST 18 12/08/2017   ALT 14 12/08/2017   ALBUMIN 3.3 (L) 12/08/2017   ALKPHOS 75 12/08/2017     Electrolytes Lab Results  Component Value Date   NA 139 12/08/2017   K 3.1 (L) 12/08/2017   CL 103 12/08/2017   CALCIUM 8.9 12/08/2017     Bone No results found for: VD25OH, VD125OH2TOT, GN5621HY8, MV7846NG2, 25OHVITD1, 25OHVITD2, 25OHVITD3, TESTOFREE, TESTOSTERONE   Inflammation (CRP: Acute Phase) (ESR: Chronic Phase) No results found for: CRP, ESRSEDRATE, LATICACIDVEN     Note: Above Lab results reviewed.   Physical Exam  General appearance: Well nourished, well developed, and well hydrated. In no apparent acute distress Mental status: Alert, oriented x 3 (person, place, & time)       Respiratory: No evidence of acute respiratory distress Eyes: PERLA Vitals: BP (!) 116/96 (BP Location: Left Arm, Patient Position: Sitting, Cuff Size: Normal)   Pulse 86   Temp (!) 97 F (36.1 C)   Resp 18   Ht 5' 9" (1.753 m)   Wt 228 lb (103.4 kg)   SpO2 100%   BMI 33.67 kg/m  BMI: Estimated body mass index is 33.67 kg/m as calculated from the following:   Height as of this encounter: 5' 9" (1.753 m).   Weight as of this encounter: 228 lb (103.4 kg). Ideal: Ideal body weight: 66.2 kg (145 lb 15.1 oz) Adjusted ideal body weight: 81.1 kg (178 lb 12.3 oz)  Upper Extremity (UE) Exam    Side: Right upper extremity  Side: Left upper extremity  Skin & Extremity Inspection: Skin color, temperature, and hair growth are WNL. No peripheral edema or cyanosis. No masses, redness, swelling, asymmetry, or associated skin lesions. No contractures.  Skin & Extremity Inspection: Skin color, temperature, and hair growth are WNL. No peripheral edema or cyanosis. No  masses, redness, swelling, asymmetry, or associated skin lesions. No contractures.  Functional ROM: Improved after treatment          Functional ROM: Unrestricted ROM          Muscle Tone/Strength: Functionally intact. No obvious neuro-muscular anomalies detected.  Muscle Tone/Strength: Functionally intact. No obvious neuro-muscular anomalies detected.  Sensory (Neurological): Improved          Sensory (Neurological): Unimpaired          Palpation: No palpable anomalies              Palpation: No palpable anomalies              Provocative Test(s):  Phalen's test: deferred Tinel's test: deferred Apley's scratch test (touch opposite shoulder):  Action 1 (Across chest): Improved ROM Action 2 (Overhead): Improved ROM Action 3 (LB reach): Improved ROM   Provocative Test(s):  Phalen's test: deferred Tinel's test: deferred Apley's scratch test (touch opposite shoulder):  Action 1 (Across chest): deferred Action 2 (Overhead): deferred Action 3 (LB reach): deferred    Lumbar Spine Area Exam  Skin & Axial Inspection: No masses, redness, or swelling Alignment: Symmetrical Functional ROM: Pain restricted ROM affecting both sides Stability: No instability detected Muscle Tone/Strength: Functionally intact. No obvious neuro-muscular anomalies detected. Sensory (Neurological): Musculoskeletal pain pattern   Assessment   Status Diagnosis  Controlled Controlled Controlled 1. Myotonic muscular dystrophy ([place])   2. 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 Spinal stenosis, lumbar region, with neurogenic claudication   4. Lumbar radiculopathy   5. Arthropathy  of right shoulder   6. Right rotator cuff tear arthropathy   7. Chronic right shoulder pain   8. Chronic pain syndrome        Plan of Care   Ms. Ineta A Peach has a current medication list which includes the following long-term medication(s): amlodipine, atorvastatin, loratadine, montelukast, and olmesartan.   Pharmacotherapy (Medications  Ordered): Meds ordered this encounter  Medications   Buprenorphine HCl (BELBUCA) 150 MCG FILM    Sig: Place 1 Film inside cheek every 12 (twelve) hours.    Dispense:  60 each    Refill:  5    For chronic pain syndrome    Orders Placed This Encounter  Procedures   ToxASSURE Select 13 (MW), Urine    Volume: 30 ml(s). Minimum 3 ml of urine is needed. Document temperature of fresh sample. Indications: Long term (current) use of opiate analgesic (Z79.891)    Order Specific Question:   Release to patient    Answer:   Immediate     Follow-up plan:   Return in about 6 months (around 09/24/2021) for Medication Management, in person.     Status post L4-L5 ESI #1 on 10/16/2019, repeat.  Left L4 [facility] peripheral nerve stimulation 02/19/2020; removed 05/08/2020., right L4 [facility] peripheral nerve stimulation 03/13/2020, lead fracture, replaced 04/15/2020, right removed 06/20/2020.  Right axillary peripheral nerve stimulation 07/29/2020.       Recent Visits Date Type Provider Dept  12/31/20 Office Visit , , MD [facility]  Showing recent visits within past 90 days and meeting all other requirements Today's Visits Date Type Provider Dept  03/27/21 Office Visit , , MD [facility]  Showing today's visits and meeting all other requirements Future Appointments No visits were found meeting these conditions. Showing future appointments within next 90 days and meeting all other requirements I discussed the assessment and treatment plan with the patient. The patient was provided an opportunity to ask questions and all were answered. The patient agreed with the plan and demonstrated an understanding of the instructions.  Patient advised to call back or seek an in-person evaluation if the symptoms or condition worsens.  Duration of encounter: 30 minutes.  Note by:  , MD Date: 03/27/2021; Time: 10:31 AM 

## 2021-03-28 DIAGNOSIS — Z202 Contact with and (suspected) exposure to infections with a predominantly sexual mode of transmission: Secondary | ICD-10-CM | POA: Diagnosis not present

## 2021-03-28 DIAGNOSIS — N760 Acute vaginitis: Secondary | ICD-10-CM | POA: Diagnosis not present

## 2021-03-28 DIAGNOSIS — E278 Other specified disorders of adrenal gland: Secondary | ICD-10-CM | POA: Diagnosis not present

## 2021-03-28 DIAGNOSIS — R87619 Unspecified abnormal cytological findings in specimens from cervix uteri: Secondary | ICD-10-CM | POA: Diagnosis not present

## 2021-03-28 DIAGNOSIS — Z1272 Encounter for screening for malignant neoplasm of vagina: Secondary | ICD-10-CM | POA: Diagnosis not present

## 2021-03-28 DIAGNOSIS — E162 Hypoglycemia, unspecified: Secondary | ICD-10-CM | POA: Diagnosis not present

## 2021-03-28 DIAGNOSIS — G7111 Myotonic muscular dystrophy: Secondary | ICD-10-CM | POA: Diagnosis not present

## 2021-03-28 DIAGNOSIS — E6609 Other obesity due to excess calories: Secondary | ICD-10-CM | POA: Diagnosis not present

## 2021-03-28 DIAGNOSIS — M7600 Gluteal tendinitis, unspecified hip: Secondary | ICD-10-CM | POA: Diagnosis not present

## 2021-03-28 DIAGNOSIS — E161 Other hypoglycemia: Secondary | ICD-10-CM | POA: Diagnosis not present

## 2021-03-31 DIAGNOSIS — F4310 Post-traumatic stress disorder, unspecified: Secondary | ICD-10-CM | POA: Diagnosis not present

## 2021-03-31 DIAGNOSIS — F411 Generalized anxiety disorder: Secondary | ICD-10-CM | POA: Diagnosis not present

## 2021-03-31 DIAGNOSIS — F0632 Mood disorder due to known physiological condition with major depressive-like episode: Secondary | ICD-10-CM | POA: Diagnosis not present

## 2021-04-01 ENCOUNTER — Encounter: Payer: Self-pay | Admitting: Dermatology

## 2021-04-01 LAB — TOXASSURE SELECT 13 (MW), URINE

## 2021-04-07 DIAGNOSIS — E119 Type 2 diabetes mellitus without complications: Secondary | ICD-10-CM | POA: Diagnosis not present

## 2021-04-09 DIAGNOSIS — F4310 Post-traumatic stress disorder, unspecified: Secondary | ICD-10-CM | POA: Diagnosis not present

## 2021-04-09 DIAGNOSIS — F0632 Mood disorder due to known physiological condition with major depressive-like episode: Secondary | ICD-10-CM | POA: Diagnosis not present

## 2021-04-09 DIAGNOSIS — F411 Generalized anxiety disorder: Secondary | ICD-10-CM | POA: Diagnosis not present

## 2021-04-11 DIAGNOSIS — E271 Primary adrenocortical insufficiency: Secondary | ICD-10-CM | POA: Diagnosis not present

## 2021-04-11 DIAGNOSIS — B009 Herpesviral infection, unspecified: Secondary | ICD-10-CM | POA: Diagnosis not present

## 2021-04-11 DIAGNOSIS — I10 Essential (primary) hypertension: Secondary | ICD-10-CM | POA: Diagnosis not present

## 2021-04-11 DIAGNOSIS — T1490XA Injury, unspecified, initial encounter: Secondary | ICD-10-CM | POA: Diagnosis not present

## 2021-04-11 DIAGNOSIS — F329 Major depressive disorder, single episode, unspecified: Secondary | ICD-10-CM | POA: Diagnosis not present

## 2021-04-11 DIAGNOSIS — G901 Familial dysautonomia [Riley-Day]: Secondary | ICD-10-CM | POA: Diagnosis not present

## 2021-04-11 DIAGNOSIS — E782 Mixed hyperlipidemia: Secondary | ICD-10-CM | POA: Diagnosis not present

## 2021-04-11 DIAGNOSIS — E119 Type 2 diabetes mellitus without complications: Secondary | ICD-10-CM | POA: Diagnosis not present

## 2021-04-11 DIAGNOSIS — M7581 Other shoulder lesions, right shoulder: Secondary | ICD-10-CM | POA: Diagnosis not present

## 2021-04-14 DIAGNOSIS — F0632 Mood disorder due to known physiological condition with major depressive-like episode: Secondary | ICD-10-CM | POA: Diagnosis not present

## 2021-04-14 DIAGNOSIS — Z7984 Long term (current) use of oral hypoglycemic drugs: Secondary | ICD-10-CM | POA: Diagnosis not present

## 2021-04-14 DIAGNOSIS — R7303 Prediabetes: Secondary | ICD-10-CM | POA: Diagnosis not present

## 2021-04-14 DIAGNOSIS — F4310 Post-traumatic stress disorder, unspecified: Secondary | ICD-10-CM | POA: Diagnosis not present

## 2021-04-14 DIAGNOSIS — E875 Hyperkalemia: Secondary | ICD-10-CM | POA: Diagnosis not present

## 2021-04-14 DIAGNOSIS — F411 Generalized anxiety disorder: Secondary | ICD-10-CM | POA: Diagnosis not present

## 2021-04-14 DIAGNOSIS — E162 Hypoglycemia, unspecified: Secondary | ICD-10-CM | POA: Diagnosis not present

## 2021-04-14 DIAGNOSIS — E278 Other specified disorders of adrenal gland: Secondary | ICD-10-CM | POA: Diagnosis not present

## 2021-04-15 DIAGNOSIS — G7111 Myotonic muscular dystrophy: Secondary | ICD-10-CM | POA: Diagnosis not present

## 2021-04-15 DIAGNOSIS — E875 Hyperkalemia: Secondary | ICD-10-CM | POA: Diagnosis not present

## 2021-04-15 DIAGNOSIS — Z888 Allergy status to other drugs, medicaments and biological substances status: Secondary | ICD-10-CM | POA: Diagnosis not present

## 2021-04-15 DIAGNOSIS — Z885 Allergy status to narcotic agent status: Secondary | ICD-10-CM | POA: Diagnosis not present

## 2021-04-15 DIAGNOSIS — H26493 Other secondary cataract, bilateral: Secondary | ICD-10-CM | POA: Diagnosis not present

## 2021-04-23 DIAGNOSIS — G4733 Obstructive sleep apnea (adult) (pediatric): Secondary | ICD-10-CM | POA: Diagnosis not present

## 2021-04-28 DIAGNOSIS — E278 Other specified disorders of adrenal gland: Secondary | ICD-10-CM | POA: Diagnosis not present

## 2021-04-28 DIAGNOSIS — D3502 Benign neoplasm of left adrenal gland: Secondary | ICD-10-CM | POA: Diagnosis not present

## 2021-04-30 DIAGNOSIS — T1490XA Injury, unspecified, initial encounter: Secondary | ICD-10-CM | POA: Diagnosis not present

## 2021-05-21 DIAGNOSIS — E875 Hyperkalemia: Secondary | ICD-10-CM | POA: Diagnosis not present

## 2021-05-21 DIAGNOSIS — E119 Type 2 diabetes mellitus without complications: Secondary | ICD-10-CM | POA: Diagnosis not present

## 2021-05-21 DIAGNOSIS — R55 Syncope and collapse: Secondary | ICD-10-CM | POA: Diagnosis not present

## 2021-05-21 DIAGNOSIS — G7111 Myotonic muscular dystrophy: Secondary | ICD-10-CM | POA: Diagnosis not present

## 2021-05-21 DIAGNOSIS — I10 Essential (primary) hypertension: Secondary | ICD-10-CM | POA: Diagnosis not present

## 2021-05-23 DIAGNOSIS — E875 Hyperkalemia: Secondary | ICD-10-CM | POA: Diagnosis not present

## 2021-05-23 DIAGNOSIS — E161 Other hypoglycemia: Secondary | ICD-10-CM | POA: Diagnosis not present

## 2021-05-23 DIAGNOSIS — G7111 Myotonic muscular dystrophy: Secondary | ICD-10-CM | POA: Diagnosis not present

## 2021-05-23 DIAGNOSIS — H43813 Vitreous degeneration, bilateral: Secondary | ICD-10-CM | POA: Diagnosis not present

## 2021-05-23 DIAGNOSIS — G245 Blepharospasm: Secondary | ICD-10-CM | POA: Diagnosis not present

## 2021-05-23 DIAGNOSIS — E119 Type 2 diabetes mellitus without complications: Secondary | ICD-10-CM | POA: Diagnosis not present

## 2021-05-23 DIAGNOSIS — Z0001 Encounter for general adult medical examination with abnormal findings: Secondary | ICD-10-CM | POA: Diagnosis not present

## 2021-05-23 DIAGNOSIS — E6609 Other obesity due to excess calories: Secondary | ICD-10-CM | POA: Diagnosis not present

## 2021-05-23 DIAGNOSIS — Z0100 Encounter for examination of eyes and vision without abnormal findings: Secondary | ICD-10-CM | POA: Diagnosis not present

## 2021-05-23 DIAGNOSIS — I10 Essential (primary) hypertension: Secondary | ICD-10-CM | POA: Diagnosis not present

## 2021-05-30 DIAGNOSIS — G4733 Obstructive sleep apnea (adult) (pediatric): Secondary | ICD-10-CM | POA: Diagnosis not present

## 2021-06-03 DIAGNOSIS — E875 Hyperkalemia: Secondary | ICD-10-CM | POA: Diagnosis not present

## 2021-06-03 DIAGNOSIS — F329 Major depressive disorder, single episode, unspecified: Secondary | ICD-10-CM | POA: Diagnosis not present

## 2021-06-03 DIAGNOSIS — M7581 Other shoulder lesions, right shoulder: Secondary | ICD-10-CM | POA: Diagnosis not present

## 2021-06-03 DIAGNOSIS — I10 Essential (primary) hypertension: Secondary | ICD-10-CM | POA: Diagnosis not present

## 2021-06-03 DIAGNOSIS — G901 Familial dysautonomia [Riley-Day]: Secondary | ICD-10-CM | POA: Diagnosis not present

## 2021-06-03 DIAGNOSIS — B009 Herpesviral infection, unspecified: Secondary | ICD-10-CM | POA: Diagnosis not present

## 2021-06-03 DIAGNOSIS — E271 Primary adrenocortical insufficiency: Secondary | ICD-10-CM | POA: Diagnosis not present

## 2021-06-03 DIAGNOSIS — E119 Type 2 diabetes mellitus without complications: Secondary | ICD-10-CM | POA: Diagnosis not present

## 2021-06-03 DIAGNOSIS — B354 Tinea corporis: Secondary | ICD-10-CM | POA: Diagnosis not present

## 2021-06-23 DIAGNOSIS — E162 Hypoglycemia, unspecified: Secondary | ICD-10-CM | POA: Diagnosis not present

## 2021-06-23 DIAGNOSIS — E119 Type 2 diabetes mellitus without complications: Secondary | ICD-10-CM | POA: Diagnosis not present

## 2021-06-23 DIAGNOSIS — E569 Vitamin deficiency, unspecified: Secondary | ICD-10-CM | POA: Diagnosis not present

## 2021-06-23 DIAGNOSIS — E875 Hyperkalemia: Secondary | ICD-10-CM | POA: Diagnosis not present

## 2021-06-24 DIAGNOSIS — E875 Hyperkalemia: Secondary | ICD-10-CM | POA: Diagnosis not present

## 2021-06-24 DIAGNOSIS — E569 Vitamin deficiency, unspecified: Secondary | ICD-10-CM | POA: Diagnosis not present

## 2021-06-24 DIAGNOSIS — E119 Type 2 diabetes mellitus without complications: Secondary | ICD-10-CM | POA: Diagnosis not present

## 2021-06-26 DIAGNOSIS — E119 Type 2 diabetes mellitus without complications: Secondary | ICD-10-CM | POA: Diagnosis not present

## 2021-07-10 ENCOUNTER — Other Ambulatory Visit: Payer: Self-pay | Admitting: Neurology

## 2021-07-10 DIAGNOSIS — G7111 Myotonic muscular dystrophy: Secondary | ICD-10-CM

## 2021-07-21 DIAGNOSIS — R519 Headache, unspecified: Secondary | ICD-10-CM | POA: Diagnosis not present

## 2021-07-21 DIAGNOSIS — E559 Vitamin D deficiency, unspecified: Secondary | ICD-10-CM | POA: Diagnosis not present

## 2021-07-21 DIAGNOSIS — I10 Essential (primary) hypertension: Secondary | ICD-10-CM | POA: Diagnosis not present

## 2021-07-23 ENCOUNTER — Encounter: Payer: Self-pay | Admitting: Student in an Organized Health Care Education/Training Program

## 2021-07-23 DIAGNOSIS — G4733 Obstructive sleep apnea (adult) (pediatric): Secondary | ICD-10-CM | POA: Diagnosis not present

## 2021-07-24 ENCOUNTER — Encounter: Payer: Self-pay | Admitting: Student in an Organized Health Care Education/Training Program

## 2021-07-24 DIAGNOSIS — M5416 Radiculopathy, lumbar region: Secondary | ICD-10-CM

## 2021-07-28 ENCOUNTER — Ambulatory Visit: Admission: RE | Admit: 2021-07-28 | Payer: Medicare PPO | Source: Ambulatory Visit

## 2021-07-28 DIAGNOSIS — E162 Hypoglycemia, unspecified: Secondary | ICD-10-CM | POA: Diagnosis not present

## 2021-08-06 DIAGNOSIS — E875 Hyperkalemia: Secondary | ICD-10-CM | POA: Diagnosis not present

## 2021-08-06 DIAGNOSIS — E278 Other specified disorders of adrenal gland: Secondary | ICD-10-CM | POA: Diagnosis not present

## 2021-08-06 DIAGNOSIS — I10 Essential (primary) hypertension: Secondary | ICD-10-CM | POA: Diagnosis not present

## 2021-08-28 DIAGNOSIS — G4733 Obstructive sleep apnea (adult) (pediatric): Secondary | ICD-10-CM | POA: Diagnosis not present

## 2021-09-01 DIAGNOSIS — E278 Other specified disorders of adrenal gland: Secondary | ICD-10-CM | POA: Diagnosis not present

## 2021-09-01 DIAGNOSIS — I10 Essential (primary) hypertension: Secondary | ICD-10-CM | POA: Diagnosis not present

## 2021-09-01 DIAGNOSIS — E162 Hypoglycemia, unspecified: Secondary | ICD-10-CM | POA: Diagnosis not present

## 2021-09-01 DIAGNOSIS — E875 Hyperkalemia: Secondary | ICD-10-CM | POA: Diagnosis not present

## 2021-09-15 DIAGNOSIS — E119 Type 2 diabetes mellitus without complications: Secondary | ICD-10-CM | POA: Diagnosis not present

## 2021-09-16 DIAGNOSIS — C719 Malignant neoplasm of brain, unspecified: Secondary | ICD-10-CM | POA: Diagnosis not present

## 2021-09-16 DIAGNOSIS — E875 Hyperkalemia: Secondary | ICD-10-CM | POA: Diagnosis not present

## 2021-09-16 DIAGNOSIS — G7111 Myotonic muscular dystrophy: Secondary | ICD-10-CM | POA: Diagnosis not present

## 2021-09-16 DIAGNOSIS — M48061 Spinal stenosis, lumbar region without neurogenic claudication: Secondary | ICD-10-CM | POA: Diagnosis not present

## 2021-09-16 DIAGNOSIS — E11649 Type 2 diabetes mellitus with hypoglycemia without coma: Secondary | ICD-10-CM | POA: Diagnosis not present

## 2021-09-17 DIAGNOSIS — Z1331 Encounter for screening for depression: Secondary | ICD-10-CM | POA: Diagnosis not present

## 2021-09-17 DIAGNOSIS — K5090 Crohn's disease, unspecified, without complications: Secondary | ICD-10-CM | POA: Diagnosis not present

## 2021-09-17 DIAGNOSIS — E875 Hyperkalemia: Secondary | ICD-10-CM | POA: Diagnosis not present

## 2021-09-17 DIAGNOSIS — I10 Essential (primary) hypertension: Secondary | ICD-10-CM | POA: Diagnosis not present

## 2021-09-17 DIAGNOSIS — Z1330 Encounter for screening examination for mental health and behavioral disorders, unspecified: Secondary | ICD-10-CM | POA: Diagnosis not present

## 2021-09-17 DIAGNOSIS — Z7689 Persons encountering health services in other specified circumstances: Secondary | ICD-10-CM | POA: Diagnosis not present

## 2021-09-17 DIAGNOSIS — R55 Syncope and collapse: Secondary | ICD-10-CM | POA: Diagnosis not present

## 2021-09-17 DIAGNOSIS — G7111 Myotonic muscular dystrophy: Secondary | ICD-10-CM | POA: Diagnosis not present

## 2021-09-17 DIAGNOSIS — E162 Hypoglycemia, unspecified: Secondary | ICD-10-CM | POA: Diagnosis not present

## 2021-09-18 ENCOUNTER — Other Ambulatory Visit: Payer: Self-pay

## 2021-09-18 ENCOUNTER — Ambulatory Visit
Payer: Medicare PPO | Attending: Student in an Organized Health Care Education/Training Program | Admitting: Student in an Organized Health Care Education/Training Program

## 2021-09-18 ENCOUNTER — Encounter: Payer: Self-pay | Admitting: Student in an Organized Health Care Education/Training Program

## 2021-09-18 VITALS — BP 126/101 | HR 77 | Temp 97.3°F | Resp 18 | Ht 69.0 in | Wt 228.0 lb

## 2021-09-18 DIAGNOSIS — M48062 Spinal stenosis, lumbar region with neurogenic claudication: Secondary | ICD-10-CM | POA: Insufficient documentation

## 2021-09-18 DIAGNOSIS — G7111 Myotonic muscular dystrophy: Secondary | ICD-10-CM | POA: Diagnosis not present

## 2021-09-18 DIAGNOSIS — M12811 Other specific arthropathies, not elsewhere classified, right shoulder: Secondary | ICD-10-CM | POA: Diagnosis not present

## 2021-09-18 DIAGNOSIS — M19011 Primary osteoarthritis, right shoulder: Secondary | ICD-10-CM | POA: Diagnosis not present

## 2021-09-18 DIAGNOSIS — M75101 Unspecified rotator cuff tear or rupture of right shoulder, not specified as traumatic: Secondary | ICD-10-CM | POA: Diagnosis not present

## 2021-09-18 DIAGNOSIS — E119 Type 2 diabetes mellitus without complications: Secondary | ICD-10-CM | POA: Diagnosis not present

## 2021-09-18 DIAGNOSIS — G894 Chronic pain syndrome: Secondary | ICD-10-CM | POA: Diagnosis not present

## 2021-09-18 DIAGNOSIS — M5416 Radiculopathy, lumbar region: Secondary | ICD-10-CM | POA: Diagnosis not present

## 2021-09-18 DIAGNOSIS — E7402 Pompe disease: Secondary | ICD-10-CM | POA: Diagnosis not present

## 2021-09-18 DIAGNOSIS — I10 Essential (primary) hypertension: Secondary | ICD-10-CM | POA: Diagnosis not present

## 2021-09-18 MED ORDER — BELBUCA 150 MCG BU FILM: 1.0000 | ORAL_FILM | Freq: Two times a day (BID) | BUCCAL | 5 refills | Status: DC

## 2021-09-18 NOTE — Progress Notes (Signed)
PROVIDER NOTE: Information contained herein reflects review and annotations entered in association with encounter. Interpretation of such information and data should be left to medically-trained personnel. Information provided to patient can be located elsewhere in the medical record under "Patient Instructions". Document created using STT-dictation technology, any transcriptional errors that may result from process are unintentional.  ?  ?Patient: Vickie Stevens  Service Category: E/M  Provider: Gillis Santa, MD  ?DOB: 1971/02/03  DOS: 09/18/2021  Specialty: Interventional Pain Management  ?MRN: [id number]  Setting: Ambulatory outpatient  PCP: Jodi Marble, MD  ?Type: Established Patient    Referring Provider: Jodi Marble, MD  ?Location: Office  Delivery: Face-to-face    ? ?HPI  ?[name], a 51 y.o. year old female, is here today because of her Lumbar radiculopathy [M54.16]. [name] primary complain today is Muscular Dystrophy  ? ?Last encounter: My last encounter with her was on 03/27/2021. ?Pertinent problems: [name] has DDD (degenerative disc disease), cervical; Rotator cuff (capsule) sprain Tear of rotator cuff; Lumbar and sacral arthritis; Bilateral occipital neuralgia; Myotonic muscular dystrophy ([place]); Myotonic dystrophy ([place]); Chronic pain syndrome; and Lumbar facet arthropathy on their pertinent problem list. ?Pain Assessment: Severity of Chronic pain is reported as a 7 /10. Location:  (MD pain)  / . Onset: More than a month ago. Quality: Aching, Constant, Throbbing. Timing: Constant. Modifying factor(s): medications, warm baths, water therapy, massage, sleep. ?Vitals:  height is 5' 9"  (1.753 m) and weight is 228 lb (103.4 kg). Her temporal temperature is 97.3 ?F (36.3 ?C) (abnormal). Her blood pressure is 126/101 (abnormal) and her pulse is 77. Her respiration is 18 and oxygen saturation is 99%.  ? ?Reason for encounter:  ? ? ?Refill of buprenorphine  ?Overall she states  she is doing well. ?She is working with endocrinology and nephrology to work-up her potassium and glucose abnormality ?She is still getting really good pain relief from her peripheral nerve stimulation procedures done for her lower back at L4 medial branch as well as her shoulder for which we did a axillary nerve stim ? ?ROS  ?Constitutional: Denies any fever or chills ?Gastrointestinal: No reported hemesis, hematochezia, vomiting, or acute GI distress ?Musculoskeletal: low back pain ?Neurological: No reported episodes of acute onset apraxia, aphasia, dysarthria, agnosia, amnesia, paralysis, loss of coordination, or loss of consciousness ? ?Medication Review  ?Buprenorphine HCl, amLODipine, and atorvastatin ? ?History Review  ?Allergy: [name] is allergic to phenergan [promethazine hcl], codeine, cortisone, lidocaine, and oxycodone. ?Drug: [name]  reports no history of drug use. ?Alcohol:  reports no history of alcohol use. ?Tobacco:  reports that she has never smoked. She has never used smokeless tobacco. ?Social: [name]  reports that she has never smoked. She has never used smokeless tobacco. She reports that she does not drink alcohol and does not use drugs. ?Medical:  has a past medical history of ADHD (attention deficit hyperactivity disorder), Biceps tendonitis, Bipolar disorder ([place]), Chronic back pain, Cluster headache, Crohn's disease ([place]), DDD (degenerative disc disease), lumbar, Depression, Diabetes mellitus without complication ([place]), Hypercholesteremia, Hypertension, PCOS (polycystic ovarian syndrome), Rotator cuff tendinitis, Sleep apnea, and Tuberous sclerosis ([place]). ?Surgical: [name]  has a past surgical history that includes Endometrial ablation (01-2011); Shoulder surgery (Left, 07-1999, 07-2011); Cataract extraction, bilateral; Esophagogastroduodenoscopy (egd) with propofol (N/A, 06/08/2018); Colonoscopy with propofol (N/A, 06/08/2018); and SCS 8/ 2021. ?Family: family history  includes Diabetes in her father; Hyperlipidemia in her father; Hypertension in her father. ? ?Laboratory Chemistry Profile  ? ?  Renal ?Lab Results  ?Component Value Date  ? BUN 22 (H) 12/08/2017  ? CREATININE 0.60 12/08/2017  ? GFRAA >60 12/08/2017  ? GFRNONAA >60 12/08/2017  ? ?  Hepatic ?Lab Results  ?Component Value Date  ? AST 18 12/08/2017  ? ALT 14 12/08/2017  ? ALBUMIN 3.3 (L) 12/08/2017  ? ALKPHOS 75 12/08/2017  ? ?  ?Electrolytes ?Lab Results  ?Component Value Date  ? NA 139 12/08/2017  ? K 3.1 (L) 12/08/2017  ? CL 103 12/08/2017  ? CALCIUM 8.9 12/08/2017  ? ?  Bone ?No results found for: [place], [id number], [id number], [id number], 25OHVITD1, 25OHVITD2, 25OHVITD3, TESTOFREE, TESTOSTERONE ?  ?Inflammation (CRP: Acute Phase) (ESR: Chronic Phase) ?No results found for: CRP, ESRSEDRATE, LATICACIDVEN ?    ?Note: Above Lab results reviewed. ? ? ?Physical Exam  ?General appearance: Well nourished, well developed, and well hydrated. In no apparent acute distress ?Mental status: Alert, oriented x 3 (person, place, & time)       ?Respiratory: No evidence of acute respiratory distress ?Eyes: PERLA ?Vitals: BP (!) 126/101   Pulse 77   Temp (!) 97.3 ?F (36.3 ?C) (Temporal)   Resp 18   Ht 5' 9"  (1.753 m)   Wt 228 lb (103.4 kg)   SpO2 99%   BMI 33.67 kg/m?  ?BMI: Estimated body mass index is 33.67 kg/m? as calculated from the following: ?  Height as of this encounter: 5' 9"  (1.753 m). ?  Weight as of this encounter: 228 lb (103.4 kg). ?Ideal: Ideal body weight: 66.2 kg (145 lb 15.1 oz) ?Adjusted ideal body weight: 81.1 kg (178 lb 12.3 oz) ? ?Upper Extremity (UE) Exam    ?Side: Right upper extremity  Side: Left upper extremity  ?Skin & Extremity Inspection: Skin color, temperature, and hair growth are WNL. No peripheral edema or cyanosis. No masses, redness, swelling, asymmetry, or associated skin lesions. No contractures.  Skin & Extremity Inspection: Skin color, temperature, and hair growth are WNL. No peripheral  edema or cyanosis. No masses, redness, swelling, asymmetry, or associated skin lesions. No contractures.  ?Functional ROM: Improved after treatment          Functional ROM: Unrestricted ROM          ?Muscle Tone/Strength: Functionally intact. No obvious neuro-muscular anomalies detected.  Muscle Tone/Strength: Functionally intact. No obvious neuro-muscular anomalies detected.  ?Sensory (Neurological): Improved          Sensory (Neurological): Unimpaired          ?Palpation: No palpable anomalies              Palpation: No palpable anomalies              ?Provocative Test(s):  ?Phalen's test: deferred ?Tinel's test: deferred ?Apley's scratch test (touch opposite shoulder):  ?Action 1 (Across chest): Improved ROM ?Action 2 (Overhead): Improved ROM ?Action 3 (LB reach): Improved ROM ?  Provocative Test(s):  ?Phalen's test: deferred ?Tinel's test: deferred ?Apley's scratch test (touch opposite shoulder):  ?Action 1 (Across chest): deferred ?Action 2 (Overhead): deferred ?Action 3 (LB reach): deferred ?  ? ?Lumbar Spine Area Exam  ?Skin & Axial Inspection: No masses, redness, or swelling ?Alignment: Symmetrical ?Functional ROM: Pain restricted ROM affecting both sides ?Stability: No instability detected ?Muscle Tone/Strength: Functionally intact. No obvious neuro-muscular anomalies detected. ?Sensory (Neurological): Musculoskeletal pain pattern ? ? ?Assessment  ? ?Status Diagnosis  ?Controlled ?Controlled ?Controlled 1. Lumbar radiculopathy   ?2. Myotonic muscular dystrophy ([place])   ?3. 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  4. Spinal stenosis, lumbar region, with neurogenic claudication   ?5. Arthropathy of right shoulder   ?6. Right rotator cuff tear arthropathy   ?7. Chronic pain syndrome   ? ? ? ?  ? ?Plan of Care  ? ?[name] has a current medication list which includes the following long-term medication(s): amlodipine and atorvastatin. ? ? ?Pharmacotherapy (Medications Ordered): ?Meds ordered this encounter   ?Medications  ? Buprenorphine HCl (BELBUCA) 150 MCG FILM  ?  Sig: Place 1 Film inside cheek every 12 (twelve) hours.  ?  Dispense:  60 each  ?  Refill:  5  ?  For chronic pain syndrome  ? ? ?No orders of the defined types wer

## 2021-09-18 NOTE — Progress Notes (Signed)
Nursing Pain Medication Assessment:  ?Safety precautions to be maintained throughout the outpatient stay will include: orient to surroundings, keep bed in low position, maintain call bell within reach at all times, provide assistance with transfer out of bed and ambulation.  ?Medication Inspection Compliance: Vickie Stevens did not comply with our request to bring her pills to be counted. She was reminded that bringing the medication bottles, even when empty, is a requirement. ? ?Medication: None brought in. ?Pill/Patch Count: None available to be counted. ?Bottle Appearance: No container available. Did not bring bottle(s) to appointment. ?Filled Date: N/A ?Last Medication intake:  Yesterday ?

## 2021-09-23 ENCOUNTER — Encounter: Payer: Medicare PPO | Admitting: Student in an Organized Health Care Education/Training Program

## 2021-10-01 ENCOUNTER — Other Ambulatory Visit: Payer: Self-pay | Admitting: Internal Medicine

## 2021-10-01 ENCOUNTER — Other Ambulatory Visit: Payer: Self-pay | Admitting: Nurse Practitioner

## 2021-10-01 DIAGNOSIS — Z1231 Encounter for screening mammogram for malignant neoplasm of breast: Secondary | ICD-10-CM

## 2021-10-03 DIAGNOSIS — B009 Herpesviral infection, unspecified: Secondary | ICD-10-CM | POA: Diagnosis not present

## 2021-10-03 DIAGNOSIS — E119 Type 2 diabetes mellitus without complications: Secondary | ICD-10-CM | POA: Diagnosis not present

## 2021-10-03 DIAGNOSIS — I10 Essential (primary) hypertension: Secondary | ICD-10-CM | POA: Diagnosis not present

## 2021-10-03 DIAGNOSIS — E875 Hyperkalemia: Secondary | ICD-10-CM | POA: Diagnosis not present

## 2021-10-03 DIAGNOSIS — G901 Familial dysautonomia [Riley-Day]: Secondary | ICD-10-CM | POA: Diagnosis not present

## 2021-10-03 DIAGNOSIS — B354 Tinea corporis: Secondary | ICD-10-CM | POA: Diagnosis not present

## 2021-10-03 DIAGNOSIS — M7581 Other shoulder lesions, right shoulder: Secondary | ICD-10-CM | POA: Diagnosis not present

## 2021-10-03 DIAGNOSIS — E271 Primary adrenocortical insufficiency: Secondary | ICD-10-CM | POA: Diagnosis not present

## 2021-10-03 DIAGNOSIS — F329 Major depressive disorder, single episode, unspecified: Secondary | ICD-10-CM | POA: Diagnosis not present

## 2021-10-21 DIAGNOSIS — G4733 Obstructive sleep apnea (adult) (pediatric): Secondary | ICD-10-CM | POA: Diagnosis not present

## 2021-10-27 ENCOUNTER — Ambulatory Visit
Admission: RE | Admit: 2021-10-27 | Discharge: 2021-10-27 | Disposition: A | Discharge status: Home / Self Care | Payer: Medicare PPO | Source: Ambulatory Visit | Attending: Nurse Practitioner | Admitting: Nurse Practitioner

## 2021-10-27 DIAGNOSIS — Z1231 Encounter for screening mammogram for malignant neoplasm of breast: Secondary | ICD-10-CM | POA: Diagnosis not present

## 2021-11-03 DIAGNOSIS — M6281 Muscle weakness (generalized): Secondary | ICD-10-CM | POA: Diagnosis not present

## 2021-11-07 DIAGNOSIS — Z8669 Personal history of other diseases of the nervous system and sense organs: Secondary | ICD-10-CM | POA: Diagnosis not present

## 2021-11-07 DIAGNOSIS — H00025 Hordeolum internum left lower eyelid: Secondary | ICD-10-CM | POA: Diagnosis not present

## 2021-11-07 DIAGNOSIS — Z961 Presence of intraocular lens: Secondary | ICD-10-CM | POA: Diagnosis not present

## 2021-11-10 DIAGNOSIS — H0015 Chalazion left lower eyelid: Secondary | ICD-10-CM | POA: Diagnosis not present

## 2021-11-27 DIAGNOSIS — E278 Other specified disorders of adrenal gland: Secondary | ICD-10-CM | POA: Diagnosis not present

## 2021-11-27 DIAGNOSIS — E559 Vitamin D deficiency, unspecified: Secondary | ICD-10-CM | POA: Diagnosis not present

## 2021-11-27 DIAGNOSIS — R7303 Prediabetes: Secondary | ICD-10-CM | POA: Diagnosis not present

## 2021-11-27 DIAGNOSIS — E162 Hypoglycemia, unspecified: Secondary | ICD-10-CM | POA: Diagnosis not present

## 2021-12-04 DIAGNOSIS — E119 Type 2 diabetes mellitus without complications: Secondary | ICD-10-CM | POA: Diagnosis not present

## 2022-01-07 DIAGNOSIS — E669 Obesity, unspecified: Secondary | ICD-10-CM | POA: Diagnosis not present

## 2022-01-07 DIAGNOSIS — E278 Other specified disorders of adrenal gland: Secondary | ICD-10-CM | POA: Diagnosis not present

## 2022-01-07 DIAGNOSIS — E559 Vitamin D deficiency, unspecified: Secondary | ICD-10-CM | POA: Diagnosis not present

## 2022-01-07 DIAGNOSIS — E162 Hypoglycemia, unspecified: Secondary | ICD-10-CM | POA: Diagnosis not present

## 2022-01-07 DIAGNOSIS — R7303 Prediabetes: Secondary | ICD-10-CM | POA: Diagnosis not present

## 2022-01-13 DIAGNOSIS — G4733 Obstructive sleep apnea (adult) (pediatric): Secondary | ICD-10-CM | POA: Diagnosis not present

## 2022-01-29 ENCOUNTER — Encounter: Payer: Self-pay | Admitting: Student in an Organized Health Care Education/Training Program

## 2022-02-10 DIAGNOSIS — F4310 Post-traumatic stress disorder, unspecified: Secondary | ICD-10-CM | POA: Diagnosis not present

## 2022-02-10 DIAGNOSIS — Z7689 Persons encountering health services in other specified circumstances: Secondary | ICD-10-CM | POA: Diagnosis not present

## 2022-02-10 DIAGNOSIS — Z1211 Encounter for screening for malignant neoplasm of colon: Secondary | ICD-10-CM | POA: Diagnosis not present

## 2022-02-10 DIAGNOSIS — K50919 Crohn's disease, unspecified, with unspecified complications: Secondary | ICD-10-CM | POA: Diagnosis not present

## 2022-02-10 DIAGNOSIS — E271 Primary adrenocortical insufficiency: Secondary | ICD-10-CM | POA: Diagnosis not present

## 2022-02-10 DIAGNOSIS — G7100 Muscular dystrophy, unspecified: Secondary | ICD-10-CM | POA: Diagnosis not present

## 2022-02-10 DIAGNOSIS — G4730 Sleep apnea, unspecified: Secondary | ICD-10-CM | POA: Diagnosis not present

## 2022-02-23 DIAGNOSIS — E119 Type 2 diabetes mellitus without complications: Secondary | ICD-10-CM | POA: Diagnosis not present

## 2022-03-02 DIAGNOSIS — Z79899 Other long term (current) drug therapy: Secondary | ICD-10-CM | POA: Diagnosis not present

## 2022-03-02 DIAGNOSIS — E119 Type 2 diabetes mellitus without complications: Secondary | ICD-10-CM | POA: Diagnosis not present

## 2022-03-02 DIAGNOSIS — I10 Essential (primary) hypertension: Secondary | ICD-10-CM | POA: Diagnosis not present

## 2022-03-02 DIAGNOSIS — G7111 Myotonic muscular dystrophy: Secondary | ICD-10-CM | POA: Diagnosis not present

## 2022-03-02 DIAGNOSIS — E559 Vitamin D deficiency, unspecified: Secondary | ICD-10-CM | POA: Diagnosis not present

## 2022-03-02 DIAGNOSIS — E875 Hyperkalemia: Secondary | ICD-10-CM | POA: Diagnosis not present

## 2022-03-02 DIAGNOSIS — I951 Orthostatic hypotension: Secondary | ICD-10-CM | POA: Diagnosis not present

## 2022-03-03 DIAGNOSIS — R7309 Other abnormal glucose: Secondary | ICD-10-CM | POA: Diagnosis not present

## 2022-03-04 DIAGNOSIS — I10 Essential (primary) hypertension: Secondary | ICD-10-CM | POA: Diagnosis not present

## 2022-03-04 DIAGNOSIS — E119 Type 2 diabetes mellitus without complications: Secondary | ICD-10-CM | POA: Diagnosis not present

## 2022-03-04 DIAGNOSIS — G7111 Myotonic muscular dystrophy: Secondary | ICD-10-CM | POA: Diagnosis not present

## 2022-03-04 DIAGNOSIS — K5900 Constipation, unspecified: Secondary | ICD-10-CM | POA: Diagnosis not present

## 2022-03-04 DIAGNOSIS — M7581 Other shoulder lesions, right shoulder: Secondary | ICD-10-CM | POA: Diagnosis not present

## 2022-03-04 DIAGNOSIS — G901 Familial dysautonomia [Riley-Day]: Secondary | ICD-10-CM | POA: Diagnosis not present

## 2022-03-04 DIAGNOSIS — F329 Major depressive disorder, single episode, unspecified: Secondary | ICD-10-CM | POA: Diagnosis not present

## 2022-03-04 DIAGNOSIS — B009 Herpesviral infection, unspecified: Secondary | ICD-10-CM | POA: Diagnosis not present

## 2022-03-04 DIAGNOSIS — E782 Mixed hyperlipidemia: Secondary | ICD-10-CM | POA: Diagnosis not present

## 2022-03-05 ENCOUNTER — Other Ambulatory Visit: Payer: Self-pay | Admitting: Student in an Organized Health Care Education/Training Program

## 2022-03-05 ENCOUNTER — Ambulatory Visit
Payer: Medicare PPO | Attending: Student in an Organized Health Care Education/Training Program | Admitting: Student in an Organized Health Care Education/Training Program

## 2022-03-05 ENCOUNTER — Encounter: Payer: Self-pay | Admitting: Student in an Organized Health Care Education/Training Program

## 2022-03-05 VITALS — BP 119/84 | HR 81 | Temp 97.1°F | Resp 18 | Ht 69.0 in | Wt 225.0 lb

## 2022-03-05 DIAGNOSIS — E7402 Pompe disease: Secondary | ICD-10-CM

## 2022-03-05 DIAGNOSIS — M19011 Primary osteoarthritis, right shoulder: Secondary | ICD-10-CM | POA: Diagnosis not present

## 2022-03-05 DIAGNOSIS — M75101 Unspecified rotator cuff tear or rupture of right shoulder, not specified as traumatic: Secondary | ICD-10-CM | POA: Insufficient documentation

## 2022-03-05 DIAGNOSIS — M12811 Other specific arthropathies, not elsewhere classified, right shoulder: Secondary | ICD-10-CM

## 2022-03-05 DIAGNOSIS — G7111 Myotonic muscular dystrophy: Secondary | ICD-10-CM | POA: Diagnosis not present

## 2022-03-05 DIAGNOSIS — M5416 Radiculopathy, lumbar region: Secondary | ICD-10-CM | POA: Diagnosis not present

## 2022-03-05 DIAGNOSIS — M48062 Spinal stenosis, lumbar region with neurogenic claudication: Secondary | ICD-10-CM | POA: Diagnosis not present

## 2022-03-05 DIAGNOSIS — G894 Chronic pain syndrome: Secondary | ICD-10-CM | POA: Diagnosis not present

## 2022-03-05 MED ORDER — BELBUCA 150 MCG BU FILM: 1.0000 | ORAL_FILM | Freq: Two times a day (BID) | BUCCAL | 5 refills | Status: DC

## 2022-03-05 NOTE — Progress Notes (Signed)
03Nursing Pain Medication Assessment:  Safety precautions to be maintained throughout the outpatient stay will include: orient to surroundings, keep bed in low position, maintain call bell within reach at all times, provide assistance with transfer out of bed and ambulation.  Medication Inspection Compliance: Pill count conducted under aseptic conditions, in front of the patient. Neither the pills nor the bottle was removed from the patient's sight at any time. Once count was completed pills were immediately returned to the patient in their original bottle.  Medication: Buprenorphine (Suboxone) Pill/Patch Count:  03 of 60 pills remain Pill/Patch Appearance: Markings consistent with prescribed medication Bottle Appearance: no pharmacy label Filled Date: no pharmacy label Last Medication intake:  Today  Pt states 5 of 60 films in current box were thicker than normal rendering films unable to adhere to mucosa, instead curling up on themselves - pt states 3X regular thickness, and that she tried to hold films in place with her tongue but they curled up more and fell off. Pt states that she reported to pharmacist and was directed to report to manufacturer, which she did.

## 2022-03-05 NOTE — Progress Notes (Addendum)
PROVIDER NOTE: Information contained herein reflects review and annotations entered in association with encounter. Interpretation of such information and data should be left to medically-trained personnel. Information provided to patient can be located elsewhere in the medical record under "Patient Instructions". Document created using STT-dictation technology, any transcriptional errors that may result from process are unintentional.    Patient: Vickie Stevens  Service Category: E/M  Provider: Gillis Santa, MD  DOB: 1970/10/16  DOS: 03/05/2022  Specialty: Interventional Pain Management  MRN: [id number]  Setting: Ambulatory outpatient  PCP: Jodi Marble, MD  Type: Established Patient    Referring Provider: Jodi Marble, MD  Location: Office  Delivery: Face-to-face     HPI  [name], a 51 y.o. 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 Ms. Bily primary complain today is No chief complaint on file.  Last encounter: My last encounter with her was on 09/18/21 Pertinent problems: Ms. 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 Pain Assessment: Severity of Chronic pain is reported as a 6 /10. Location: Other (Comment) (and RIGHT ELBOW and LEFT side of neck) Left/ . Onset: More than a month ago. Quality: Aching, Throbbing. Timing: Constant. Modifying factor(s): meds, PNS. Vitals:  height is 5' 9"  (1.753 m) and weight is 225 lb (102.1 kg). Her temporal temperature is 97.1 F (36.2 C) (abnormal). Her blood pressure is 119/84 and her pulse is 81. Her respiration is 18 and oxygen saturation is 100%.   Reason for encounter:    Refill of buprenorphine. States that some of  her belbuca films in her box are thicker than others and do not adhere to her inside cheek She has notified the pharmacy and also the manufacturer Has upcoming conference in [place] medial epicondyle pain that is new onset, no inciting or traumatic event, will continue to monitor Left SCM pain as well for which she has tried TPIs with limited response.  ROS  Constitutional: Denies any fever or chills Gastrointestinal: No reported hemesis, hematochezia, vomiting, or acute GI distress Musculoskeletal: low back pain Neurological: No reported episodes of acute onset apraxia, aphasia, dysarthria, agnosia, amnesia, paralysis, loss of coordination, or loss of consciousness  Medication Review  Buprenorphine HCl, amLODipine, and atorvastatin  History Review  Allergy: Ms. Provencio is allergic to phenergan [promethazine hcl], codeine, cortisone, lidocaine, and oxycodone. Drug: Ms. Schier  reports no history of drug use. Alcohol:  reports no history of alcohol use. Tobacco:  reports that she has never smoked. She has never used smokeless tobacco. Social: Ms. Grawe  reports that she has never smoked. She has never used smokeless tobacco. She reports that she does not drink alcohol and does not use drugs. 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 Surgical: Ms. Livas  has a past surgical history that includes Endometrial ablation (01-2011); Shoulder surgery (Left, 07-1999, 07-2011); Cataract extraction, bilateral; Esophagogastroduodenoscopy (egd) with propofol (N/A, 06/08/2018); Colonoscopy with propofol (N/A,  06/08/2018); and SCS 8/ 2021. Family: family history includes Diabetes in her father;  Hyperlipidemia in her father; Hypertension in her father.  Laboratory Chemistry Profile   Renal Lab Results  Component Value Date   BUN 22 (H) 12/08/2017   CREATININE 0.60 12/08/2017   GFRAA >60 12/08/2017   GFRNONAA >60 12/08/2017     Hepatic Lab Results  Component Value Date   AST 18 12/08/2017   ALT 14 12/08/2017   ALBUMIN 3.3 (L) 12/08/2017   ALKPHOS 75 12/08/2017     Electrolytes Lab Results  Component Value Date   NA 139 12/08/2017   K 3.1 (L) 12/08/2017   CL 103 12/08/2017   CALCIUM 8.9 12/08/2017     Bone No results found for: "VD25OH", "VD125OH2TOT", "ZD6387FI4", "PP2951OA4", "25OHVITD1", "25OHVITD2", "25OHVITD3", "TESTOFREE", "TESTOSTERONE"   Inflammation (CRP: Acute Phase) (ESR: Chronic Phase) No results found for: "CRP", "ESRSEDRATE", "LATICACIDVEN"     Note: Above Lab results reviewed.   Physical Exam  General appearance: Well nourished, well developed, and well hydrated. In no apparent acute distress Mental status: Alert, oriented x 3 (person, place, & time)       Respiratory: No evidence of acute respiratory distress Eyes: PERLA Vitals: BP 119/84   Pulse 81   Temp (!) 97.1 F (36.2 C) (Temporal)   Resp 18   Ht 5' 9"  (1.753 m)   Wt 225 lb (102.1 kg)   SpO2 100%   BMI 33.23 kg/m  BMI: Estimated body mass index is 33.23 kg/m as calculated from the following:   Height as of this encounter: 5' 9"  (1.753 m).   Weight as of this encounter: 225 lb (102.1 kg). Ideal: Ideal body weight: 66.2 kg (145 lb 15.1 oz) Adjusted ideal body weight: 80.5 kg (177 lb 9.1 oz)  Upper Extremity (UE) Exam    Side: Right upper extremity  Side: Left upper extremity  Skin & Extremity Inspection: Skin color, temperature, and hair growth are WNL. No peripheral edema or cyanosis. No masses, redness, swelling, asymmetry, or associated skin lesions. No contractures.  Skin & Extremity  Inspection: Skin color, temperature, and hair growth are WNL. No peripheral edema or cyanosis. No masses, redness, swelling, asymmetry, or associated skin lesions. No contractures.  Functional ROM: Improved after treatment          Functional ROM: Unrestricted ROM          Muscle Tone/Strength: Functionally intact. No obvious neuro-muscular anomalies detected.  Muscle Tone/Strength: Functionally intact. No obvious neuro-muscular anomalies detected.  Sensory (Neurological): Improved          Sensory (Neurological): Unimpaired          Palpation: No palpable anomalies              Palpation: No palpable anomalies              Provocative Test(s):  Phalen's test: deferred Tinel's test: deferred Apley's scratch test (touch opposite shoulder):  Action 1 (Across chest): Improved ROM Action 2 (Overhead): Improved ROM Action 3 (LB reach): Improved ROM   Provocative Test(s):  Phalen's test: deferred Tinel's test: deferred Apley's scratch test (touch opposite shoulder):  Action 1 (Across chest): deferred Action 2 (Overhead): deferred Action 3 (LB reach): deferred    Lumbar Spine Area Exam  Skin & Axial Inspection: No masses, redness, or swelling Alignment: Symmetrical Functional ROM: Pain restricted ROM affecting both sides Stability: No instability detected Muscle Tone/Strength: Functionally intact. No obvious neuro-muscular anomalies detected. Sensory (Neurological): Musculoskeletal pain pattern   Assessment   Status Diagnosis  Controlled Controlled Controlled 1. 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 Spinal stenosis, lumbar region, with neurogenic claudication   4. Arthropathy of right shoulder   5. Right rotator cuff tear arthropathy   6. Lumbar radiculopathy   7. Chronic pain syndrome      Plan of Care   [name] has a current medication list which includes the following long-term medication(s): amlodipine and  atorvastatin.   Pharmacotherapy (Medications Ordered): Meds ordered this encounter  Medications   Buprenorphine HCl (BELBUCA) 150 MCG FILM    Sig: Place 1 Film inside cheek every 12 (twelve) hours.    Dispense:  60 each    Refill:  5    For chronic pain syndrome    Orders Placed This Encounter  Procedures   ToxASSURE Select 13 (MW), Urine    Volume: 30 ml(s). Minimum 3 ml of urine is needed. Document temperature of fresh sample. Indications: Long term (current) use of opiate analgesic [phone])    Order Specific Question:   Release to patient    Answer:   Immediate     Follow-up plan:   Return in about 6 months (around 09/03/2022) for Medication Management, in person.     Status post L4-L5 ESI #1 on 10/16/2019, repeat.  Left L4 Sprint peripheral nerve stimulation 02/19/2020; removed 05/08/2020., right L4 Sprint peripheral nerve stimulation 03/13/2020, lead fracture, replaced 04/15/2020, right removed 06/20/2020.  Right axillary peripheral nerve stimulation 07/29/2020.       Recent Visits No visits were found meeting these conditions. Showing recent visits within past 90 days and meeting all other requirements Today's Visits Date Type Provider Dept  03/05/22 Office Visit Gillis Santa, MD [facility]  Showing today's visits and meeting all other requirements Future Appointments No visits were found meeting these conditions. Showing future appointments within next 90 days and meeting all other requirements  I discussed the assessment and treatment plan with the patient. The patient was provided an opportunity to ask questions and all were answered. The patient agreed with the plan and demonstrated an understanding of the instructions.  Patient advised to call back or seek an in-person evaluation if the symptoms or condition worsens.  Duration of encounter: 30 minutes.  Note by: Gillis Santa, MD Date: 03/05/2022; Time: 2:53 PM

## 2022-03-10 ENCOUNTER — Encounter: Payer: Medicare PPO | Admitting: Student in an Organized Health Care Education/Training Program

## 2022-03-11 LAB — TOXASSURE SELECT 13 (MW), URINE

## 2022-03-27 DIAGNOSIS — N760 Acute vaginitis: Secondary | ICD-10-CM | POA: Diagnosis not present

## 2022-03-27 DIAGNOSIS — Z1272 Encounter for screening for malignant neoplasm of vagina: Secondary | ICD-10-CM | POA: Diagnosis not present

## 2022-03-27 DIAGNOSIS — Z0001 Encounter for general adult medical examination with abnormal findings: Secondary | ICD-10-CM | POA: Diagnosis not present

## 2022-03-27 DIAGNOSIS — B354 Tinea corporis: Secondary | ICD-10-CM | POA: Diagnosis not present

## 2022-03-31 ENCOUNTER — Ambulatory Visit
Payer: Medicare PPO | Attending: Student in an Organized Health Care Education/Training Program | Admitting: Student in an Organized Health Care Education/Training Program

## 2022-03-31 ENCOUNTER — Encounter: Payer: Self-pay | Admitting: Student in an Organized Health Care Education/Training Program

## 2022-03-31 DIAGNOSIS — M25512 Pain in left shoulder: Secondary | ICD-10-CM

## 2022-03-31 DIAGNOSIS — M25552 Pain in left hip: Secondary | ICD-10-CM | POA: Diagnosis not present

## 2022-03-31 DIAGNOSIS — G8929 Other chronic pain: Secondary | ICD-10-CM | POA: Diagnosis not present

## 2022-03-31 DIAGNOSIS — G894 Chronic pain syndrome: Secondary | ICD-10-CM | POA: Diagnosis not present

## 2022-03-31 NOTE — Progress Notes (Signed)
Patient: Vickie Stevens  Service Category: E/M  Provider: Gillis Santa, MD  DOB: 1970-07-23  DOS: 03/31/2022  Location: Office  MRN: [id number]  Setting: Ambulatory outpatient  Referring Provider: Jodi Marble, MD  Type: Established Patient  Specialty: Interventional Pain Management  PCP: Jodi Marble, MD  Location: Remote location  Delivery: TeleHealth     Virtual Encounter - Pain Management PROVIDER NOTE: Information contained herein reflects review and annotations entered in association with encounter. Interpretation of such information and data should be left to medically-trained personnel. Information provided to patient can be located elsewhere in the medical record under "Patient Instructions". Document created using STT-dictation technology, any transcriptional errors that may result from process are unintentional.    Contact & Pharmacy Preferred: [phone] Home: [phone] (home) Mobile: [phone] (mobile) E-mail: [email or web address]  [facility], [place] [address] Phone: [phone] Fax: [phone]  [facility] [place] [place] [address] Phone: [phone] Fax: [phone]  [id number] [place] [address] [place] [address]Phone: [phone]: [phone] [id number] [place] [address]: [phone]: [phone] Pre-screening  Ms. Senft offered "in-person" vs "virtual" encounter. She indicated preferring virtual for this encounter.   Reason COVID-19*  Social distancing based on CDC and AMA recommendations.   I contacted EiDeveron Furlongn 03/31/2022 via telephone.      I clearly identified myself as BiGillis SantaMD. I verified that I was speaking with the correct person using two  identifiers (Name: EiKAYLYNNE ANDRESand date of birth: 7/09-Jun-1971  Consent I sought verbal advanced consent from EiDeveron Furlongor virtual visit interactions. I informed Ms. KhNoycef possible security and privacy concerns, risks, and limitations associated with providing "not-in-person" medical evaluation and management services. I also informed Ms. KhSerdaf the availability of "in-person" appointments. Finally, I informed her that there would be a charge for the virtual visit and that she could be  personally, fully or partially, financially responsible for it. Ms. KhPronovostxpressed understanding and agreed to proceed.   Historic Elements   Ms. EiAZLYNN MITNICKs a [age].o. year old, female patient evaluated today after our last contact on 03/05/2022. Ms. 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 Ms. KhHillmannas a current medication list which includes the following prescription(s): amlodipine, atorvastatin, and belbuca. She  reports that she has never smoked. She has never used smokeless tobacco. She reports that she does not drink alcohol and does not use drugs. Ms. KhCavendishs allergic to phenergan [promethazine hcl], codeine, cortisone, lidocaine, and oxycodone.   HPI  Today, she is being contacted for new problems.  Increased left hip  pain, increased pain from sitting to standing  Left hip pain does improve after walking Also having increased left shoulder  pain Started approx 1 month ago No fall, no trauma  Pharmacotherapy Assessment   Opioid Analgesic: Belbuca 150 mcg twice daily    Monitoring: [place] PMP: PDMP not reviewed this encounter.       Pharmacotherapy: No side-effects or adverse reactions reported. Compliance: No problems identified. Effectiveness: Clinically acceptable. Plan: Refer to "POC". UDS:  Summary  Date Value Ref Range Status  03/05/2022 Note  Final    Comment:    ==================================================================== ToxASSURE Select 13 (MW) ==================================================================== Test                             Result       Flag       Units  Drug Present and Declared for Prescription Verification   Buprenorphine                  4            EXPECTED   ng/mg creat   Norbuprenorphine               14           EXPECTED   ng/mg creat    Source of buprenorphine is a scheduled prescription medication.    Norbuprenorphine is an expected metabolite of buprenorphine.  ==================================================================== Test                      Result    Flag   Units      Ref Range   Creatinine              190              mg/dL      >=20 ==================================================================== Declared Medications:  The flagging and interpretation on this report are based on the  following declared medications.  Unexpected results may arise from  inaccuracies in the declared medications.   **Note: The testing scope of this panel does not include small to  moderate amounts of these reported medications:   Buprenorphine (Belbuca)   **Note: The testing scope of this panel does not include the  following reported medications:   Amlodipine (Norvasc)  Atorvastatin  (Lipitor) ==================================================================== For clinical consultation, please call [phone]. ====================================================================    No results found for: "CBDTHCR", "D8THCCBX", "D9THCCBX"   Laboratory Chemistry Profile   Renal Lab Results  Component Value Date   BUN 22 (H) 12/08/2017   CREATININE 0.60 12/08/2017   GFRAA >60 12/08/2017   GFRNONAA >60 12/08/2017    Hepatic Lab Results  Component Value Date   AST 18 12/08/2017   ALT 14 12/08/2017   ALBUMIN 3.3 (L) 12/08/2017   ALKPHOS 75 12/08/2017    Electrolytes Lab Results  Component Value Date   NA 139 12/08/2017   K 3.1 (L) 12/08/2017   CL 103 12/08/2017   CALCIUM 8.9 12/08/2017    Bone No results found for: "VD25OH", "VD125OH2TOT", "HC6237SE8", "BT5176HY0", "25OHVITD1", "25OHVITD2", "25OHVITD3", "TESTOFREE", "TESTOSTERONE"  Inflammation (CRP: Acute Phase) (ESR: Chronic Phase) No results found for: "CRP", "ESRSEDRATE", "LATICACIDVEN"       Note: Above Lab results reviewed.  Imaging  MM 3D SCREEN BREAST BILATERAL CLINICAL DATA:  Screening.  EXAM: DIGITAL SCREENING BILATERAL MAMMOGRAM WITH TOMOSYNTHESIS AND CAD  TECHNIQUE: Bilateral screening digital craniocaudal and mediolateral oblique mammograms were obtained. Bilateral screening digital breast tomosynthesis was performed. The images were evaluated with computer-aided detection.  COMPARISON:  Previous exam(s).  ACR Breast Density  Category c: The breast tissue is heterogeneously dense, which may obscure small masses.  FINDINGS: There are no findings suspicious for malignancy.  IMPRESSION: No mammographic evidence of malignancy. A result letter of this screening mammogram will be mailed directly to the patient.  RECOMMENDATION: Screening mammogram in one year. (Code:SM-B-01Y)  BI-RADS CATEGORY  1: Negative.  Electronically Signed   By: Abelardo Diesel M.D.   On: 10/28/2021  09:12  Assessment  The primary encounter diagnosis was Left hip pain. Diagnoses of Chronic pain syndrome and Chronic left shoulder pain were also pertinent to this visit.  Plan of Care   Orders:  Orders Placed This Encounter  Procedures   DG HIP UNILAT W OR W/O PELVIS 2-3 VIEWS LEFT    Please describe any evidence of DJD, such as joint narrowing, asymmetry, cysts, or any anomalies in bone density, production, or erosion.    Standing Status:   Future    Standing Expiration Date:   04/30/2022    Scheduling Instructions:     Imaging must be done as soon as possible. Inform patient that order will expire within 30 days and I will not renew it.    Order Specific Question:   Reason for Exam (SYMPTOM  OR DIAGNOSIS REQUIRED)    Answer:   Left hip pain/arthralgia    Order Specific Question:   Is the patient pregnant?    Answer:   No    Order Specific Question:   Preferred imaging location?    Answer:   [facility]    Order Specific Question:   Call Results- Best Contact Number?    Answer:   [phone] ([facility])    Order Specific Question:   Release to patient    Answer:   Immediate   DG Shoulder Left    Standing Status:   Future    Standing Expiration Date:   04/30/2022    Scheduling Instructions:     Imaging must be done as soon as possible. Inform patient that order will expire within 30 days and I will not renew it.    Order Specific Question:   Reason for Exam (SYMPTOM  OR DIAGNOSIS REQUIRED)    Answer:   Left shoulder pain    Order Specific Question:   Is the patient pregnant?    Answer:   No    Order Specific Question:   Preferred imaging location?    Answer:   [facility]    Order Specific Question:   Call Results- Best Contact Number?    Answer:   [phone] ([facility])    Order Specific Question:   Release to patient    Answer:   Immediate   Follow-up plan:   Return for will call pt with xray results and treatment plan.     Status post  L4-L5 ESI #1 on 10/16/2019, repeat.  Left L4 Sprint peripheral nerve stimulation 02/19/2020; removed 05/08/2020., right L4 Sprint peripheral nerve stimulation 03/13/2020, lead fracture, replaced 04/15/2020, right removed 06/20/2020.  Right axillary peripheral nerve stimulation 07/29/2020.        Recent Visits Date Type Provider Dept  03/05/22 Office Visit Gillis Santa, MD [facility]  Showing recent visits within past 90 days and meeting all other requirements Today's Visits Date Type Provider Dept  03/31/22 Office Visit Gillis Santa, MD [facility]  Showing today's visits and meeting all other requirements Future Appointments No visits were found meeting these conditions. Showing future appointments within next 90 days and  meeting all other requirements  I discussed the assessment and treatment plan with the patient. The patient was provided an opportunity to ask questions and all were answered. The patient agreed with the plan and demonstrated an understanding of the instructions.  Patient advised to call back or seek an in-person evaluation if the symptoms or condition worsens.  Duration of encounter:43mnutes.  Note by: BGillis Santa MD Date: 03/31/2022; Time: 3:45 PM

## 2022-04-03 ENCOUNTER — Ambulatory Visit
Admission: RE | Admit: 2022-04-03 | Discharge: 2022-04-03 | Disposition: A | Discharge status: Home / Self Care | Payer: Medicare PPO | Source: Ambulatory Visit | Attending: Nephrology | Admitting: Nephrology

## 2022-04-03 ENCOUNTER — Ambulatory Visit
Admission: RE | Admit: 2022-04-03 | Discharge: 2022-04-03 | Disposition: A | Discharge status: Home / Self Care | Payer: Medicare PPO | Source: Ambulatory Visit | Attending: Student in an Organized Health Care Education/Training Program | Admitting: Student in an Organized Health Care Education/Training Program

## 2022-04-03 DIAGNOSIS — M19012 Primary osteoarthritis, left shoulder: Secondary | ICD-10-CM | POA: Diagnosis not present

## 2022-04-03 DIAGNOSIS — G8929 Other chronic pain: Secondary | ICD-10-CM

## 2022-04-03 DIAGNOSIS — M25552 Pain in left hip: Secondary | ICD-10-CM | POA: Insufficient documentation

## 2022-04-03 DIAGNOSIS — G894 Chronic pain syndrome: Secondary | ICD-10-CM

## 2022-04-03 DIAGNOSIS — M25512 Pain in left shoulder: Secondary | ICD-10-CM | POA: Insufficient documentation

## 2022-04-06 DIAGNOSIS — G4733 Obstructive sleep apnea (adult) (pediatric): Secondary | ICD-10-CM | POA: Diagnosis not present

## 2022-04-07 ENCOUNTER — Telehealth: Payer: Self-pay

## 2022-04-07 NOTE — Telephone Encounter (Signed)
Called patient and notified of xray results, PT and take tylenol if able. Patient with understanding.

## 2022-04-14 DIAGNOSIS — E669 Obesity, unspecified: Secondary | ICD-10-CM | POA: Diagnosis not present

## 2022-04-14 DIAGNOSIS — R7303 Prediabetes: Secondary | ICD-10-CM | POA: Diagnosis not present

## 2022-04-14 DIAGNOSIS — E278 Other specified disorders of adrenal gland: Secondary | ICD-10-CM | POA: Diagnosis not present

## 2022-04-14 DIAGNOSIS — E559 Vitamin D deficiency, unspecified: Secondary | ICD-10-CM | POA: Diagnosis not present

## 2022-05-08 DIAGNOSIS — Z79891 Long term (current) use of opiate analgesic: Secondary | ICD-10-CM | POA: Diagnosis not present

## 2022-05-08 DIAGNOSIS — I517 Cardiomegaly: Secondary | ICD-10-CM | POA: Diagnosis not present

## 2022-05-08 DIAGNOSIS — R55 Syncope and collapse: Secondary | ICD-10-CM | POA: Diagnosis not present

## 2022-05-08 DIAGNOSIS — G7111 Myotonic muscular dystrophy: Secondary | ICD-10-CM | POA: Diagnosis not present

## 2022-05-08 DIAGNOSIS — I482 Chronic atrial fibrillation, unspecified: Secondary | ICD-10-CM | POA: Diagnosis not present

## 2022-05-08 DIAGNOSIS — Z79899 Other long term (current) drug therapy: Secondary | ICD-10-CM | POA: Diagnosis not present

## 2022-05-14 DIAGNOSIS — E119 Type 2 diabetes mellitus without complications: Secondary | ICD-10-CM | POA: Diagnosis not present

## 2022-06-14 DIAGNOSIS — R55 Syncope and collapse: Secondary | ICD-10-CM | POA: Diagnosis not present

## 2022-06-18 ENCOUNTER — Encounter: Payer: Self-pay | Admitting: Student in an Organized Health Care Education/Training Program

## 2022-06-18 DIAGNOSIS — I10 Essential (primary) hypertension: Secondary | ICD-10-CM | POA: Diagnosis not present

## 2022-06-18 DIAGNOSIS — E782 Mixed hyperlipidemia: Secondary | ICD-10-CM | POA: Diagnosis not present

## 2022-06-18 DIAGNOSIS — E119 Type 2 diabetes mellitus without complications: Secondary | ICD-10-CM | POA: Diagnosis not present

## 2022-06-18 DIAGNOSIS — E559 Vitamin D deficiency, unspecified: Secondary | ICD-10-CM | POA: Diagnosis not present

## 2022-06-22 DIAGNOSIS — G901 Familial dysautonomia [Riley-Day]: Secondary | ICD-10-CM | POA: Diagnosis not present

## 2022-06-22 DIAGNOSIS — Z0001 Encounter for general adult medical examination with abnormal findings: Secondary | ICD-10-CM | POA: Diagnosis not present

## 2022-06-22 DIAGNOSIS — M7581 Other shoulder lesions, right shoulder: Secondary | ICD-10-CM | POA: Diagnosis not present

## 2022-06-22 DIAGNOSIS — E559 Vitamin D deficiency, unspecified: Secondary | ICD-10-CM | POA: Diagnosis not present

## 2022-06-22 DIAGNOSIS — F329 Major depressive disorder, single episode, unspecified: Secondary | ICD-10-CM | POA: Diagnosis not present

## 2022-06-22 DIAGNOSIS — B009 Herpesviral infection, unspecified: Secondary | ICD-10-CM | POA: Diagnosis not present

## 2022-06-22 DIAGNOSIS — E1165 Type 2 diabetes mellitus with hyperglycemia: Secondary | ICD-10-CM | POA: Diagnosis not present

## 2022-06-22 DIAGNOSIS — I10 Essential (primary) hypertension: Secondary | ICD-10-CM | POA: Diagnosis not present

## 2022-06-22 DIAGNOSIS — Z1331 Encounter for screening for depression: Secondary | ICD-10-CM | POA: Diagnosis not present

## 2022-06-25 DIAGNOSIS — G4733 Obstructive sleep apnea (adult) (pediatric): Secondary | ICD-10-CM | POA: Diagnosis not present

## 2022-06-30 DIAGNOSIS — F329 Major depressive disorder, single episode, unspecified: Secondary | ICD-10-CM | POA: Diagnosis not present

## 2022-06-30 DIAGNOSIS — E782 Mixed hyperlipidemia: Secondary | ICD-10-CM | POA: Diagnosis not present

## 2022-06-30 DIAGNOSIS — E559 Vitamin D deficiency, unspecified: Secondary | ICD-10-CM | POA: Diagnosis not present

## 2022-06-30 DIAGNOSIS — K5900 Constipation, unspecified: Secondary | ICD-10-CM | POA: Diagnosis not present

## 2022-06-30 DIAGNOSIS — Z1211 Encounter for screening for malignant neoplasm of colon: Secondary | ICD-10-CM | POA: Diagnosis not present

## 2022-06-30 DIAGNOSIS — G7111 Myotonic muscular dystrophy: Secondary | ICD-10-CM | POA: Diagnosis not present

## 2022-06-30 DIAGNOSIS — Z1331 Encounter for screening for depression: Secondary | ICD-10-CM | POA: Diagnosis not present

## 2022-06-30 DIAGNOSIS — E119 Type 2 diabetes mellitus without complications: Secondary | ICD-10-CM | POA: Diagnosis not present

## 2022-06-30 DIAGNOSIS — E1165 Type 2 diabetes mellitus with hyperglycemia: Secondary | ICD-10-CM | POA: Diagnosis not present

## 2022-07-03 DIAGNOSIS — Z1212 Encounter for screening for malignant neoplasm of rectum: Secondary | ICD-10-CM | POA: Diagnosis not present

## 2022-07-03 DIAGNOSIS — Z1211 Encounter for screening for malignant neoplasm of colon: Secondary | ICD-10-CM | POA: Diagnosis not present

## 2022-07-09 LAB — COLOGUARD: COLOGUARD: NEGATIVE

## 2022-08-03 ENCOUNTER — Encounter: Payer: Self-pay | Admitting: Student in an Organized Health Care Education/Training Program

## 2022-08-03 ENCOUNTER — Telehealth: Payer: Self-pay | Admitting: *Deleted

## 2022-08-03 ENCOUNTER — Telehealth: Payer: Self-pay | Admitting: Student in an Organized Health Care Education/Training Program

## 2022-08-03 NOTE — Telephone Encounter (Signed)
Patient called in and states that d/t faulty films she is going to run out early.  She currently has enough for 19 days, if there are no more duds.  She has contacted the manufacturer but has not gotten very far with them.

## 2022-08-03 NOTE — Telephone Encounter (Signed)
Working on this message.

## 2022-08-03 NOTE — Telephone Encounter (Signed)
Patient called in and states that d/t faulty films she is going to run out early.  She currently has enough for 19 days, if there are no more duds.  She has contacted the manufacturer but has not gotten very far with them.    She will be running out on the 08/22/22.  Appt is not until 08/25/22.  Scheduling told her there were no appts.  Last fill 06/27/22 written on 03/05/22.

## 2022-08-03 NOTE — Telephone Encounter (Signed)
PT stated that she doesn't have enough medications to last until her appt on 08-25-22. PT stated that when her medications comes some of meds aren't in good conditions. PT stated that she had spoke with the provider however nothing has been done. Please give patient a call. Thanks

## 2022-08-07 ENCOUNTER — Other Ambulatory Visit: Payer: Self-pay | Admitting: Internal Medicine

## 2022-08-12 DIAGNOSIS — E119 Type 2 diabetes mellitus without complications: Secondary | ICD-10-CM | POA: Diagnosis not present

## 2022-08-14 DIAGNOSIS — I10 Essential (primary) hypertension: Secondary | ICD-10-CM | POA: Diagnosis not present

## 2022-08-14 DIAGNOSIS — E7800 Pure hypercholesterolemia, unspecified: Secondary | ICD-10-CM | POA: Diagnosis not present

## 2022-08-14 DIAGNOSIS — R7303 Prediabetes: Secondary | ICD-10-CM | POA: Diagnosis not present

## 2022-08-14 DIAGNOSIS — E162 Hypoglycemia, unspecified: Secondary | ICD-10-CM | POA: Diagnosis not present

## 2022-08-14 DIAGNOSIS — K50919 Crohn's disease, unspecified, with unspecified complications: Secondary | ICD-10-CM | POA: Diagnosis not present

## 2022-08-14 DIAGNOSIS — E875 Hyperkalemia: Secondary | ICD-10-CM | POA: Diagnosis not present

## 2022-08-14 DIAGNOSIS — K5901 Slow transit constipation: Secondary | ICD-10-CM | POA: Diagnosis not present

## 2022-08-20 ENCOUNTER — Ambulatory Visit
Payer: Medicare PPO | Attending: Student in an Organized Health Care Education/Training Program | Admitting: Student in an Organized Health Care Education/Training Program

## 2022-08-20 ENCOUNTER — Encounter: Payer: Self-pay | Admitting: Student in an Organized Health Care Education/Training Program

## 2022-08-20 VITALS — BP 135/93 | HR 85 | Temp 97.4°F | Ht 69.0 in | Wt 235.0 lb

## 2022-08-20 DIAGNOSIS — M48062 Spinal stenosis, lumbar region with neurogenic claudication: Secondary | ICD-10-CM

## 2022-08-20 DIAGNOSIS — G894 Chronic pain syndrome: Secondary | ICD-10-CM | POA: Insufficient documentation

## 2022-08-20 DIAGNOSIS — E7402 Pompe disease: Secondary | ICD-10-CM | POA: Insufficient documentation

## 2022-08-20 DIAGNOSIS — G7111 Myotonic muscular dystrophy: Secondary | ICD-10-CM | POA: Diagnosis not present

## 2022-08-20 DIAGNOSIS — M1612 Unilateral primary osteoarthritis, left hip: Secondary | ICD-10-CM | POA: Insufficient documentation

## 2022-08-20 DIAGNOSIS — M25552 Pain in left hip: Secondary | ICD-10-CM | POA: Diagnosis not present

## 2022-08-20 DIAGNOSIS — M5416 Radiculopathy, lumbar region: Secondary | ICD-10-CM | POA: Insufficient documentation

## 2022-08-20 MED ORDER — BELBUCA 150 MCG BU FILM: 1.0000 | ORAL_FILM | Freq: Two times a day (BID) | BUCCAL | 5 refills | Status: DC

## 2022-08-20 NOTE — Progress Notes (Signed)
Nursing Pain Medication Assessment:  Safety precautions to be maintained throughout the outpatient stay will include: orient to surroundings, keep bed in low position, maintain call bell within reach at all times, provide assistance with transfer out of bed and ambulation.  Medication Inspection Compliance: Pill count conducted under aseptic conditions, in front of the patient. Neither the pills nor the bottle was removed from the patient's sight at any time. Once count was completed pills were immediately returned to the patient in their original bottle.  Medication: Buprenorphine (Suboxone) Pill/Patch Count:  0 of 60 pills remain Pill/Patch Appearance: Markings consistent with prescribed medication Bottle Appearance: Standard pharmacy container. Clearly labeled. Filled Date: 1 / 3 / 2024 Last Medication intake:  YesterdaySafety precautions to be maintained throughout the outpatient stay will include: orient to surroundings, keep bed in low position, maintain call bell within reach at all times, provide assistance with transfer out of bed and ambulation.

## 2022-08-20 NOTE — Progress Notes (Signed)
PROVIDER NOTE: Information contained herein reflects review and annotations entered in association with encounter. Interpretation of such information and data should be left to medically-trained personnel. Information provided to patient can be located elsewhere in the medical record under "Patient Instructions". Document created using STT-dictation technology, any transcriptional errors that may result from process are unintentional.    Patient: Vickie Stevens  Service Category: E/M  Provider: Gillis Santa, MD  DOB: 03-04-1971  DOS: 08/20/2022  Specialty: Interventional Pain Management  MRN: [id number]  Setting: Ambulatory outpatient  PCP: Vickie Marble, MD  Type: Established Patient    Referring Provider: Jodi Marble, MD  Location: Office  Delivery: Face-to-face     HPI  [name], a 52 y.o. year old female, is here today because of her Left hip pain [M25.552]. 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 Pain Assessment: Severity of Chronic pain is reported as a 5 /10. Location: Hip Left, Right/at times pain radiaities down her leg. Onset: More than a month ago. Quality: Constant, Aching, Burning, Discomfort. Timing: Constant. Modifying factor(s): walking, patches. Vitals:  height is 5' 9"$  (1.753 m) and weight is 235 lb (106.6 kg). Her temperature is 97.4 F (36.3 C) (abnormal). Her blood pressure is 135/93 (abnormal) and her pulse is 85. Her oxygen saturation is 100%.   Reason for encounter:    Refill of buprenorphine. States that some of her belbuca films in  her box are thicker than others and do not adhere to her inside cheek, she has contacted [facility] they informed her they will investigate further. Continues to have left hip pain, hip xray below which shows mild hip degenerative changes Discussed alternatives to [facility] and she states that she has exhausted many medication trials in the past and that Belbuca has been most affordable and helpful  ROS  Constitutional: Denies any fever or chills Gastrointestinal: No reported hemesis, hematochezia, vomiting, or acute GI distress Musculoskeletal: left hip pain Neurological: No reported episodes of acute onset apraxia, aphasia, dysarthria, agnosia, amnesia, paralysis, loss of coordination, or loss of consciousness  Medication Review  Accu-Chek FastClix Lancet, Accu-Chek FastClix Lancets, Buprenorphine HCl, amLODipine, atorvastatin, and glucose blood  History Review  Allergy: Vickie Stevens is allergic to phenergan [promethazine hcl], codeine, cortisone, lidocaine, and oxycodone. Drug: Vickie Stevens  reports no history of drug use. Alcohol:  reports no history of alcohol use. Tobacco:  reports that she has never smoked. She has never used smokeless tobacco. Social: Vickie Stevens  reports that she has never smoked. She has never used smokeless tobacco. She reports that she does not drink alcohol and does not use drugs. 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 Surgical: Vickie Stevens  has a past surgical history that includes Endometrial ablation (01-2011); Shoulder surgery (Left, 07-1999, 07-2011); Cataract extraction, bilateral;  Esophagogastroduodenoscopy (egd) with propofol (N/A, 06/08/2018); Colonoscopy with propofol (N/A, 06/08/2018); and SCS 8/  2021. Family: family history includes Diabetes in her father; Hyperlipidemia in her father; Hypertension in her father.  Laboratory Chemistry Profile   Renal Lab Results  Component Value Date   BUN 22 (H) 12/08/2017   CREATININE 0.60 12/08/2017   GFRAA >60 12/08/2017   GFRNONAA >60 12/08/2017     Hepatic Lab Results  Component Value Date   AST 18 12/08/2017   ALT 14 12/08/2017   ALBUMIN 3.3 (L) 12/08/2017   ALKPHOS 75 12/08/2017     Electrolytes Lab Results  Component Value Date   NA 139 12/08/2017   K 3.1 (L) 12/08/2017   CL 103 12/08/2017   CALCIUM 8.9 12/08/2017     Bone No results found for: "VD25OH", "VD125OH2TOT", "[id number]", "[id number]", "25OHVITD1", "25OHVITD2", "25OHVITD3", "TESTOFREE", "TESTOSTERONE"   Inflammation (CRP: Acute Phase) (ESR: Chronic Phase) No results found for: "CRP", "ESRSEDRATE", "LATICACIDVEN"     Note: Above Lab results reviewed.   Physical Exam  General appearance: Well nourished, well developed, and well hydrated. In no apparent acute distress Mental status: Alert, oriented x 3 (person, place, & time)       Respiratory: No evidence of acute respiratory distress Eyes: PERLA Vitals: BP (!) 135/93   Pulse 85   Temp (!) 97.4 F (36.3 C)   Ht 5' 9"$  (1.753 m)   Wt 235 lb (106.6 kg)   SpO2 100%   BMI 34.70 kg/m  BMI: Estimated body mass index is 34.7 kg/m as calculated from the following:   Height as of this encounter: 5' 9"$  (1.753 m).   Weight as of this encounter: 235 lb (106.6 kg). Ideal: Ideal body weight: 66.2 kg (145 lb 15.1 oz) Adjusted ideal body weight: 82.4 kg (181 lb 9.1 oz)  Upper Extremity (UE) Exam    Side: Right upper extremity  Side: Left upper extremity  Skin & Extremity Inspection: Skin color, temperature, and hair growth are WNL. No peripheral edema or cyanosis. No masses, redness, swelling,  asymmetry, or associated skin lesions. No contractures.  Skin & Extremity Inspection: Skin color, temperature, and hair growth are WNL. No peripheral edema or cyanosis. No masses, redness, swelling, asymmetry, or associated skin lesions. No contractures.  Functional ROM: Improved after treatment          Functional ROM: Unrestricted ROM          Muscle Tone/Strength: Functionally intact. No obvious neuro-muscular anomalies detected.  Muscle Tone/Strength: Functionally intact. No obvious neuro-muscular anomalies detected.  Sensory (Neurological): Improved          Sensory (Neurological): Unimpaired          Palpation: No palpable anomalies              Palpation: No palpable anomalies              Provocative Test(s):  Phalen's test: deferred Tinel's test: deferred Apley's scratch test (touch opposite shoulder):  Action 1 (Across chest): Improved ROM Action 2 (Overhead): Improved ROM Action 3 (LB reach): Improved ROM   Provocative Test(s):  Phalen's test: deferred Tinel's test: deferred Apley's scratch test (touch opposite shoulder):  Action 1 (Across chest): deferred Action 2 (Overhead): deferred Action 3 (LB reach): deferred    Lumbar Spine Area Exam  Skin & Axial Inspection: No masses, redness, or swelling Alignment: Symmetrical Functional ROM: Pain restricted ROM affecting both sides Stability: No instability detected Muscle Tone/Strength: Functionally intact. No obvious neuro-muscular anomalies detected. Sensory (Neurological): Musculoskeletal pain pattern  Lower Extremity Exam    Side: Right lower extremity  Side: Left lower extremity  Stability: No instability observed          Stability: No instability observed          Skin & Extremity Inspection: Skin color, temperature, and hair growth are WNL. No peripheral edema or cyanosis. No masses, redness, swelling, asymmetry, or associated skin lesions. No contractures.  Skin & Extremity Inspection: Skin color, temperature, and hair  growth are WNL. No peripheral edema or cyanosis. No masses, redness, swelling, asymmetry, or associated skin lesions. No contractures.  Functional ROM: Unrestricted ROM                  Functional ROM: Pain restricted ROM for hip joint          Muscle Tone/Strength: Functionally intact. No obvious neuro-muscular anomalies detected.  Muscle Tone/Strength: Functionally intact. No obvious neuro-muscular anomalies detected.  Sensory (Neurological): Unimpaired        Sensory (Neurological): Arthropathic arthralgia        DTR: Patellar: deferred today Achilles: deferred today Plantar: deferred today  DTR: Patellar: deferred today Achilles: deferred today Plantar: deferred today  Palpation: No palpable anomalies  Palpation: No palpable anomalies     Assessment   Status Diagnosis  Having a Flare-up Having a Flare-up Controlled 1. Left hip pain   2. Primary osteoarthritis of left hip   3. 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 Chronic pain syndrome   5. Lumbar radiculopathy   6. 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 Spinal stenosis, lumbar region, with neurogenic claudication      Plan of Care   [name] has a current medication list which includes the following long-term medication(s): amlodipine and atorvastatin.   Pharmacotherapy (Medications Ordered): Meds ordered this encounter  Medications   Buprenorphine HCl (BELBUCA) 150 MCG FILM    Sig: Place 1 Film inside cheek every 12 (twelve) hours.    Dispense:  60 each    Refill:  5    For chronic pain syndrome    Orders Placed This Encounter  Procedures   HIP INJECTION    Standing Status:   Future    Standing Expiration Date:   11/18/2022    Scheduling Instructions:     Side: LEFT     Sedation: none     Timeframe: As soon as schedule allows     Follow-up plan:   Return in about 2 weeks (around 09/03/2022) for left hip injection, in clinic NS.     Status post L4-L5 ESI #1 on 10/16/2019, repeat.  Left L4 Sprint peripheral  nerve stimulation 02/19/2020; removed 05/08/2020., right L4 Sprint peripheral nerve stimulation 03/13/2020, lead fracture, replaced 04/15/2020, right removed 06/20/2020.  Right axillary peripheral nerve stimulation 07/29/2020.       Recent Visits No visits were found meeting these conditions. Showing recent visits within past 90 days and meeting all other requirements Today's Visits Date Type Provider Dept  08/20/22 Office Visit Vickie Santa, MD [facility]  Showing today's visits and meeting all other requirements Future Appointments No visits were found meeting these conditions. Showing future appointments within next 90 days and meeting all other requirements  I discussed the assessment and treatment plan with the patient. The patient was provided an opportunity to ask questions and all were answered. The patient agreed with the plan and demonstrated an understanding of the instructions.  Patient advised to call back or seek an in-person evaluation if the symptoms or condition worsens.  Duration of encounter: 30 minutes.  Note by: Vickie Santa, MD Date: 08/20/2022; Time: 9:28 AM

## 2022-08-25 ENCOUNTER — Encounter: Payer: Medicare PPO | Admitting: Student in an Organized Health Care Education/Training Program

## 2022-08-27 DIAGNOSIS — F32A Depression, unspecified: Secondary | ICD-10-CM | POA: Diagnosis not present

## 2022-08-27 DIAGNOSIS — Z8719 Personal history of other diseases of the digestive system: Secondary | ICD-10-CM | POA: Diagnosis not present

## 2022-08-27 DIAGNOSIS — G8929 Other chronic pain: Secondary | ICD-10-CM | POA: Diagnosis not present

## 2022-08-27 DIAGNOSIS — Z1211 Encounter for screening for malignant neoplasm of colon: Secondary | ICD-10-CM | POA: Diagnosis not present

## 2022-08-27 DIAGNOSIS — M545 Low back pain, unspecified: Secondary | ICD-10-CM | POA: Diagnosis not present

## 2022-09-02 DIAGNOSIS — G7111 Myotonic muscular dystrophy: Secondary | ICD-10-CM | POA: Diagnosis not present

## 2022-09-02 DIAGNOSIS — E875 Hyperkalemia: Secondary | ICD-10-CM | POA: Diagnosis not present

## 2022-09-02 DIAGNOSIS — I10 Essential (primary) hypertension: Secondary | ICD-10-CM | POA: Diagnosis not present

## 2022-09-02 DIAGNOSIS — E278 Other specified disorders of adrenal gland: Secondary | ICD-10-CM | POA: Diagnosis not present

## 2022-09-04 ENCOUNTER — Encounter: Payer: Self-pay | Admitting: Internal Medicine

## 2022-09-04 ENCOUNTER — Encounter: Payer: Self-pay | Admitting: Student in an Organized Health Care Education/Training Program

## 2022-09-04 ENCOUNTER — Ambulatory Visit (INDEPENDENT_AMBULATORY_CARE_PROVIDER_SITE_OTHER): Payer: Medicare PPO | Admitting: Internal Medicine

## 2022-09-04 VITALS — BP 135/72 | HR 86 | Ht 68.0 in | Wt 238.8 lb

## 2022-09-04 DIAGNOSIS — N3001 Acute cystitis with hematuria: Secondary | ICD-10-CM | POA: Diagnosis not present

## 2022-09-04 LAB — POCT URINALYSIS DIPSTICK
Bilirubin, UA: NEGATIVE
Blood, UA: NEGATIVE
Glucose, UA: POSITIVE — AB
Ketones, UA: NEGATIVE
Leukocytes, UA: NEGATIVE
Nitrite, UA: POSITIVE
Protein, UA: POSITIVE — AB
Spec Grav, UA: >=1.03 — AB (ref 1.010–1.025)
Urobilinogen, UA: 0.2 E.U./dL
pH, UA: 6 (ref 5.0–8.0)

## 2022-09-04 MED ORDER — CIPROFLOXACIN HCL 500 MG PO TABS: 500.0000 mg | ORAL_TABLET | Freq: Two times a day (BID) | ORAL | 0 refills | Status: AC

## 2022-09-04 MED ORDER — PHENAZOPYRIDINE HCL 200 MG PO TABS: 200.0000 mg | ORAL_TABLET | Freq: Three times a day (TID) | ORAL | 0 refills | Status: AC | PRN

## 2022-09-04 NOTE — Progress Notes (Signed)
Established Patient Office Visit  Subjective:  Patient ID: Vickie Stevens, female    DOB: 06-13-1971  Age: 52 y.o. MRN: [id number]  Chief Complaint  Patient presents with   Urinary Tract Infection    Possible uti    SUBJECTIVE: Vickie Stevens is a 52 y.o. female who complains of urinary frequency, urgency and dysuria, bleeding x 7 days, without flank pain, fever, chills, or abnormal vaginal discharge.   OBJECTIVE: Appears well, in no apparent distress.  Vital signs are normal. The abdomen is soft without tenderness, guarding, mass, rebound or organomegaly. No CVA tenderness or inguinal adenopathy noted. Urine dipstick shows positive for WBC'Breeze Berringer, positive for glucose, and positive for nitrates.  Micro exam: not done.   ASSESSMENT: UTI uncomplicated without evidence of pyelonephritis  PLAN: Treatment per orders - also push fluids, may use Pyridium OTC prn. Call or return to clinic prn if these symptoms worsen or fail to improve as anticipated.      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[place])     Social History   Socioeconomic History   Marital status: Divorced    Spouse name: Not on file   Number of children: Not on file   Years of education: Not on file   Highest education level: Not on file  Occupational History   Not on file  Tobacco Use   Smoking status: Never   Smokeless tobacco: Never  Vaping Use   Vaping Use: Never used  Substance and Sexual Activity   Alcohol use: No   Drug use: Never   Sexual activity: Not on file  Other Topics Concern   Not on  file  Social History Narrative   Not on file   Social Determinants of Health   Financial Resource Strain: Not on file  Food Insecurity: Not on file  Transportation Needs: Not on file  Physical Activity: Not on file  Stress: Not on file  Social Connections: Not on file  Intimate Partner Violence: Not on file    Family History  Problem Relation Age of Onset   Diabetes Father    Hyperlipidemia Father    Hypertension Father     Allergies  Allergen Reactions   Phenergan [Promethazine Hcl] Other (See Comments)    Altered mental status; patient remarked that she ripped out her IV and tore up the hospital room   Codeine Hives   Cortisone Other (See Comments)    Steroid injections may have contributed to cataracts   Lidocaine Hives    With oral, only   Oxycodone Diarrhea and Rash    Pruritic rash, also    Review of Systems  All other systems reviewed and are negative.      Objective:   BP 135/72   Pulse 86   Ht '5\' 8"'$  (1.727 m)   Wt 238 lb 12.8 oz (108.3 kg)   SpO2 99%   BMI 36.31 kg/m   Vitals:   09/04/22 1453  BP: 135/72  Pulse: 86  Height: '5\' 8"'$  (1.727 m)  Weight: 238 lb 12.8 oz (108.3 kg)  SpO2: 99%  BMI (Calculated): 36.32    Physical Exam Vitals reviewed.  Constitutional:      General: She is not in acute distress. HENT:     Head: Normocephalic.     Nose: Nose normal.     Mouth/Throat:     Mouth: Mucous membranes are moist.  Eyes:     Extraocular Movements: Extraocular movements intact.     Pupils: Pupils are equal, round, and reactive to light.  Cardiovascular:     Rate and Rhythm: Normal rate and regular rhythm.     Heart sounds: No murmur heard. Pulmonary:     Effort: Pulmonary effort is normal.     Breath sounds: No rhonchi or rales.  Abdominal:     General: Abdomen is flat.     Palpations: There is no hepatomegaly, splenomegaly or mass.  Musculoskeletal:        General: Normal range of motion.     Cervical back: Normal range of  motion. No tenderness.  Skin:    General: Skin is warm and dry.  Neurological:     General: No focal deficit present.     Mental Status: She is alert and oriented to person, place, and time.     Cranial Nerves: No cranial nerve deficit.     Motor: No weakness.  Psychiatric:        Mood and Affect: Mood normal.        Behavior: Behavior normal.      Results for orders placed or performed in visit on 09/04/22  POCT Urinalysis Dipstick (81002)  Result Value Ref Range   Color, UA orange    Clarity, UA cloudy    Glucose, UA Positive (A) Negative   Bilirubin, UA negative    Ketones, UA negative    Spec Grav, UA >=1.030 (A) 1.010 - 1.025   Blood, UA negative    pH, UA 6.0 5.0 - 8.0   Protein, UA Positive (A) Negative   Urobilinogen, UA 0.2 0.2 or 1.0 E.U./dL   Nitrite, UA positive    Leukocytes, UA Negative Negative   Appearance orange    Odor yes     Recent Results (from the past 2160 hour(Jannel Lynne))  POCT Urinalysis Dipstick [id number])     Status: Abnormal   Collection Time: 09/04/22  3:32 PM  Result Value Ref Range   Color, UA orange    Clarity, UA cloudy    Glucose, UA Positive (A) Negative   Bilirubin, UA negative    Ketones, UA negative    Spec Grav, UA >=1.030 (A) 1.010 - 1.025   Blood, UA negative    pH, UA 6.0 5.0 - 8.0   Protein, UA Positive (A) Negative   Urobilinogen, UA 0.2 0.2 or 1.0 E.U./dL   Nitrite, UA positive    Leukocytes, UA Negative Negative   Appearance orange    Odor yes       Assessment & Plan:   Problem List Items Addressed This Visit   None Visit Diagnoses     Acute cystitis with hematuria    -  Primary   Relevant Orders   POCT Glucose (CBG)   POCT Urinalysis Dipstick (81002) (Completed)       No follow-ups on file.   Total time spent: 30 minutes  Volanda Napoleon, MD  09/04/2022

## 2022-09-09 ENCOUNTER — Ambulatory Visit
Admission: RE | Admit: 2022-09-09 | Discharge: 2022-09-09 | Disposition: A | Discharge status: Home / Self Care | Payer: Medicare PPO | Source: Ambulatory Visit | Attending: Student in an Organized Health Care Education/Training Program | Admitting: Student in an Organized Health Care Education/Training Program

## 2022-09-09 ENCOUNTER — Ambulatory Visit
Payer: Medicare PPO | Attending: Student in an Organized Health Care Education/Training Program | Admitting: Student in an Organized Health Care Education/Training Program

## 2022-09-09 ENCOUNTER — Encounter: Payer: Self-pay | Admitting: Student in an Organized Health Care Education/Training Program

## 2022-09-09 VITALS — BP 130/86 | HR 78 | Temp 97.1°F | Resp 18 | Ht 68.0 in | Wt 235.0 lb

## 2022-09-09 DIAGNOSIS — M25552 Pain in left hip: Secondary | ICD-10-CM | POA: Diagnosis not present

## 2022-09-09 DIAGNOSIS — M19021 Primary osteoarthritis, right elbow: Secondary | ICD-10-CM | POA: Diagnosis not present

## 2022-09-09 DIAGNOSIS — G894 Chronic pain syndrome: Secondary | ICD-10-CM | POA: Insufficient documentation

## 2022-09-09 DIAGNOSIS — M1612 Unilateral primary osteoarthritis, left hip: Secondary | ICD-10-CM | POA: Diagnosis not present

## 2022-09-09 LAB — URINE CULTURE

## 2022-09-09 MED ORDER — DEXAMETHASONE SODIUM PHOSPHATE 10 MG/ML IJ SOLN
INTRAMUSCULAR | Status: AC
  Filled 2022-09-09: qty 1

## 2022-09-09 MED ORDER — METHYLPREDNISOLONE ACETATE 40 MG/ML IJ SUSP
40.0000 mg | Freq: Once | INTRAMUSCULAR | Status: AC
  Administered 2022-09-09: 40 mg

## 2022-09-09 MED ORDER — ROPIVACAINE HCL 2 MG/ML IJ SOLN
4.0000 mL | Freq: Once | INTRAMUSCULAR | Status: AC
  Administered 2022-09-09: 4 mL via PERINEURAL

## 2022-09-09 MED ORDER — IOHEXOL 180 MG/ML  SOLN
10.0000 mL | Freq: Once | INTRAMUSCULAR | Status: AC
  Administered 2022-09-09: 10 mL via INTRA_ARTICULAR

## 2022-09-09 MED ORDER — LIDOCAINE HCL (PF) 2 % IJ SOLN
INTRAMUSCULAR | Status: AC
  Filled 2022-09-09: qty 10

## 2022-09-09 MED ORDER — LIDOCAINE HCL 2 % IJ SOLN
20.0000 mL | Freq: Once | INTRAMUSCULAR | Status: AC
  Administered 2022-09-09: 100 mg

## 2022-09-09 MED ORDER — ROPIVACAINE HCL 2 MG/ML IJ SOLN
INTRAMUSCULAR | Status: AC
  Filled 2022-09-09: qty 20

## 2022-09-09 MED ORDER — IOHEXOL 180 MG/ML  SOLN
INTRAMUSCULAR | Status: AC
  Filled 2022-09-09: qty 20

## 2022-09-09 NOTE — Progress Notes (Signed)
PROVIDER NOTE: Interpretation of information contained herein should be left to medically-trained personnel. Specific patient instructions are provided elsewhere under "Patient Instructions" section of medical record. This document was created in part using STT-dictation technology, any transcriptional errors that may result from this process are unintentional.  Patient: Vickie Stevens Type: Established DOB: 10/25/1970 MRN: [id number] PCP: Jodi Marble, MD  Service: Procedure DOS: 09/09/2022 Setting: Ambulatory Location: Ambulatory outpatient facility Delivery: Face-to-face Provider: Gillis Santa, MD Specialty: Interventional Pain Management Specialty designation: 09 Location: Outpatient facility Ref. Prov.: Jodi Marble, MD       Interventional Therapy   Procedure: Intra-articular hip injection  #1  Laterality: Left (-LT)  Approach: Percutaneous anterior approach. Level: Lower pelvic and hip joint level.  Imaging: Fluoroscopy-guided         Anesthesia: Local anesthesia (1-2% Lidocaine)  DOS: 09/09/2022  Performed by: Gillis Santa, MD  Purpose: Diagnostic/Therapeutic Indications: Hip pain severe enough to impact quality of life or function. Rationale (medical necessity): procedure needed and proper for the diagnosis and/or treatment of Ms. Vitrano medical symptoms and needs. 1. Left hip pain   2. Primary osteoarthritis of left hip   3. Chronic pain syndrome   4. Primary osteoarthritis of right elbow    NAS-11 Pain score:   Pre-procedure: 5 /10   Post-procedure: 0-No pain/10      Target: Intra-articular hip joint Region: Hip joint, upper (proximal) femoral region Type of procedure: Percutaneous joint injection    Pre-op H&P Assessment:  Vickie Stevens is a 52 y.o. (year old), female patient, seen today for interventional treatment. She  has a past surgical history that includes Endometrial ablation (01-2011); Shoulder surgery (Left, 07-1999, 07-2011); Cataract  extraction, bilateral; Esophagogastroduodenoscopy (egd) with propofol (N/A, 06/08/2018); Colonoscopy with propofol (N/A, 06/08/2018); and SCS 8/ 2021. Vickie Stevens has a current medication list which includes the following prescription(s): accu-chek fastclix lancets, accu-chek guide, amlodipine, atorvastatin, belbuca, fluticasone, accu-chek fastclix lancet, linzess, quetiapine, valacyclovir, vitamin d (ergocalciferol), and ciprofloxacin. Her primarily concern today is the Hip Pain (left)  Initial Vital Signs:  Pulse/HCG Rate: 78ECG Heart Rate: 76 Temp: (!) 97.1 F (36.2 C) Resp: 18 BP: (!) 136/106 (Dr Holley Raring notified; saw PCP on Friday; instructed to follow up with PCP) SpO2: 100 %  BMI: Estimated body mass index is 35.73 kg/m as calculated from the following:   Height as of this encounter: '5\' 8"'$  (1.727 m).   Weight as of this encounter: 235 lb (106.6 kg).  Risk Assessment: Allergies: Reviewed. She is allergic to phenergan [promethazine hcl], codeine, cortisone, lidocaine, and oxycodone.  Allergy Precautions: None required Coagulopathies: Reviewed. None identified.  Blood-thinner therapy: None at this time Active Infection(s): Reviewed. None identified. Vickie Stevens is afebrile  Site Confirmation: Vickie Stevens was asked to confirm the procedure and laterality before marking the site Procedure checklist: Completed Consent: Before the procedure and under the influence of no sedative(s), amnesic(s), or anxiolytics, the patient was informed of the treatment options, risks and possible complications. To fulfill our ethical and legal obligations, as recommended by the American Medical Association's Code of Ethics, I have informed the patient of my clinical impression; the nature and purpose of the treatment or procedure; the risks, benefits, and possible complications of the intervention; the alternatives, including doing nothing; the risk(s) and benefit(s) of the alternative treatment(s) or  procedure(s); and the risk(s) and benefit(s) of doing nothing. The patient was provided information about the general risks and possible complications associated with the procedure. These may include, but are  not limited to: failure to achieve desired goals, infection, bleeding, organ or nerve damage, allergic reactions, paralysis, and death. In addition, the patient was informed of those risks and complications associated to the procedure, such as failure to decrease pain; infection; bleeding; organ or nerve damage with subsequent damage to sensory, motor, and/or autonomic systems, resulting in permanent pain, numbness, and/or weakness of one or several areas of the body; allergic reactions; (i.e.: anaphylactic reaction); and/or death. Furthermore, the patient was informed of those risks and complications associated with the medications. These include, but are not limited to: allergic reactions (i.e.: anaphylactic or anaphylactoid reaction(s)); adrenal axis suppression; blood sugar elevation that in diabetics may result in ketoacidosis or comma; water retention that in patients with history of congestive heart failure may result in shortness of breath, pulmonary edema, and decompensation with resultant heart failure; weight gain; swelling or edema; medication-induced neural toxicity; particulate matter embolism and blood vessel occlusion with resultant organ, and/or nervous system infarction; and/or aseptic necrosis of one or more joints. Finally, the patient was informed that Medicine is not an exact science; therefore, there is also the possibility of unforeseen or unpredictable risks and/or possible complications that may result in a catastrophic outcome. The patient indicated having understood very clearly. We have given the patient no guarantees and we have made no promises. Enough time was given to the patient to ask questions, all of which were answered to the patient's satisfaction. Vickie Stevens has  indicated that she wanted to continue with the procedure. Attestation: I, the ordering provider, attest that I have discussed with the patient the benefits, risks, side-effects, alternatives, likelihood of achieving goals, and potential problems during recovery for the procedure that I have provided informed consent. Date  Time: 09/09/2022 10:37 AM  Pre-Procedure Preparation:  Monitoring: As per clinic protocol. Respiration, ETCO2, SpO2, BP, heart rate and rhythm monitor placed and checked for adequate function Safety Precautions: Patient was assessed for positional comfort and pressure points before starting the procedure. Time-out: I initiated and conducted the "Time-out" before starting the procedure, as per protocol. The patient was asked to participate by confirming the accuracy of the "Time Out" information. Verification of the correct person, site, and procedure were performed and confirmed by me, the nursing staff, and the patient. "Time-out" conducted as per Joint Commission's Universal Protocol (UP.01.01.01). Time: 1108  Description/Narrative of Procedure:          Rationale (medical necessity): procedure needed and proper for the diagnosis and/or treatment of the patient's medical symptoms and needs. Procedural Technique Safety Precautions: Aspiration looking for blood return was conducted prior to all injections. At no point did we inject any substances, as a needle was being advanced. No attempts were made at seeking any paresthesias. Safe injection practices and needle disposal techniques used. Medications properly checked for expiration dates. SDV (single dose vial) medications used. Description of the Procedure: Protocol guidelines were followed. The patient was assisted into a comfortable position. The target area was identified and the area prepped in the usual manner. Skin & deeper tissues infiltrated with local anesthetic. Appropriate amount of time allowed to pass for local  anesthetics to take effect. The procedure needles were then advanced to the target area. Proper needle placement secured. Negative aspiration confirmed. Solution injected in intermittent fashion, asking for systemic symptoms every 0.5cc of injectate. The needles were then removed and the area cleansed, making sure to leave some of the prepping solution back to take advantage of its long term bactericidal properties.  Technical description of procedure:  Skin & deeper tissues infiltrated with local anesthetic. Appropriate amount of time allowed to pass for local anesthetics to take effect. The procedure needles were then advanced to the target area. Proper needle placement secured. Negative aspiration confirmed. Solution injected in intermittent fashion, asking for systemic symptoms every 0.5cc of injectate. The needles were then removed and the area cleansed, making sure to leave some of the prepping solution back to take advantage of its long term bactericidal properties.  5 cc solution made of 4 cc of 0.2% ropivacaine, 1 cc of methylprednisolone, 40 mg/cc.  Injected into the left hip joint after contrast confirmation under fluoroscopy.       Vitals:   09/09/22 1046 09/09/22 1103 09/09/22 1109 09/09/22 1111  BP: 134/85 138/88 (!) 141/86 130/86  Pulse:      Resp:  '18 20 18  '$ Temp:      TempSrc:      SpO2:  100% 100% 100%  Weight:      Height:         Start Time: 1108 hrs. End Time: 1111 hrs.  Imaging Guidance (Non-Spinal):          Type of Imaging Technique: Fluoroscopy Guidance (Non-Spinal) Indication(s): Assistance in needle guidance and placement for procedures requiring needle placement in or near specific anatomical locations not easily accessible without such assistance. Exposure Time: Please see nurses notes. Contrast: Before injecting any contrast, we confirmed that the patient did not have an allergy to iodine, shellfish, or radiological contrast. Once satisfactory needle  placement was completed at the desired level, radiological contrast was injected. Contrast injected under live fluoroscopy. No contrast complications. See chart for type and volume of contrast used. Fluoroscopic Guidance: I was personally present during the use of fluoroscopy. "Tunnel Vision Technique" used to obtain the best possible view of the target area. Parallax error corrected before commencing the procedure. "Direction-depth-direction" technique used to introduce the needle under continuous pulsed fluoroscopy. Once target was reached, antero-posterior, oblique, and lateral fluoroscopic projection used confirm needle placement in all planes. Images permanently stored in EMR. Interpretation: I personally interpreted the imaging intraoperatively. Adequate needle placement confirmed in multiple planes. Appropriate spread of contrast into desired area was observed. No evidence of afferent or efferent intravascular uptake. Permanent images saved into the patient's record.  Post-operative Assessment:  Post-procedure Vital Signs:  Pulse/HCG Rate: 7871 Temp: (!) 97.1 F (36.2 C) Resp: 18 BP: 130/86 SpO2: 100 %  EBL: None  Complications: No immediate post-treatment complications observed by team, or reported by patient.  Note: The patient tolerated the entire procedure well. A repeat set of vitals were taken after the procedure and the patient was kept under observation following institutional policy, for this type of procedure. Post-procedural neurological assessment was performed, showing return to baseline, prior to discharge. The patient was provided with post-procedure discharge instructions, including a section on how to identify potential problems. Should any problems arise concerning this procedure, the patient was given instructions to immediately contact us, at any time, without hesitation. In any case, we plan to contact the patient by telephone for a follow-up status report regarding this  interventional procedure.  Comments:  No additional relevant information.  Plan of Care (POC)  Orders:  Orders Placed This Encounter  Procedures   Medium Joint Injection/Arthrocentesis    Standing Status:   Future    Standing Expiration Date:   12/10/2022    Scheduling Instructions:     Right elbow steroid injection  [facility] C-ARM 1-60 MIN NO REPORT    Intraoperative interpretation by procedural physician at [facility].    Standing Status:   Standing    Number of Occurrences:   1    Order Specific Question:   Reason for exam:    Answer:   Assistance in needle guidance and placement for procedures requiring needle placement in or near specific anatomical locations not easily accessible without such assistance.   Chronic Opioid Analgesic:  Belbuca 150 mcg twice daily    Medications ordered for procedure: Meds ordered this encounter  Medications   iohexol (OMNIPAQUE) 180 MG/ML injection 10 mL    Must be Myelogram-compatible. If not available, you may substitute with a water-soluble, non-ionic, hypoallergenic, myelogram-compatible radiological contrast medium.   lidocaine (XYLOCAINE) 2 % (with pres) injection 400 mg   methylPREDNISolone acetate (DEPO-MEDROL) injection 40 mg   ropivacaine (PF) 2 mg/mL (0.2%) (NAROPIN) injection 4 mL   Medications administered: We administered iohexol, lidocaine, methylPREDNISolone acetate, and ropivacaine (PF) 2 mg/mL (0.2%).  See the medical record for exact dosing, route, and time of administration.  Follow-up plan:   Return in about 3 weeks (around 09/30/2022) for Right elbow steroid injection.       Recent Visits Date Type Provider Dept  08/20/22 Office Visit Gillis Santa, MD [facility]  Showing recent visits within past 90 days and meeting all other requirements Today's Visits Date Type Provider Dept  09/09/22 Procedure visit Gillis Santa, MD [facility]  Showing today's visits and meeting all  other requirements Future Appointments No visits were found meeting these conditions. Showing future appointments within next 90 days and meeting all other requirements  Disposition: Discharge home  Discharge (Date  Time): 09/09/2022; 1120 hrs.   Primary Care Physician: Jodi Marble, MD Location: [facility] Note by: Gillis Santa, MD (TTS technology used. I apologize for any typographical errors that were not detected and corrected.) Date: 09/09/2022; Time: 11:31 AM  Disclaimer:  Medicine is not an Chief Strategy Officer. The only guarantee in medicine is that nothing is guaranteed. It is important to note that the decision to proceed with this intervention was based on the information collected from the patient. The Data and conclusions were drawn from the patient's questionnaire, the interview, and the physical examination. Because the information was provided in large part by the patient, it cannot be guaranteed that it has not been purposely or unconsciously manipulated. Every effort has been made to obtain as much relevant data as possible for this evaluation. It is important to note that the conclusions that lead to this procedure are derived in large part from the available data. Always take into account that the treatment will also be dependent on availability of resources and existing treatment guidelines, considered by other Pain Management Practitioners as being common knowledge and practice, at the time of the intervention. For Medico-Legal purposes, it is also important to point out that variation in procedural techniques and pharmacological choices are the acceptable norm. The indications, contraindications, technique, and results of the above procedure should only be interpreted and judged by a Board-Certified Interventional Pain Specialist with extensive familiarity and expertise in the same exact procedure and technique.

## 2022-09-09 NOTE — Patient Instructions (Signed)

## 2022-09-09 NOTE — Progress Notes (Signed)
Safety precautions to be maintained throughout the outpatient stay will include: orient to surroundings, keep bed in low position, maintain call bell within reach at all times, provide assistance with transfer out of bed and ambulation.  

## 2022-09-10 ENCOUNTER — Telehealth: Payer: Self-pay

## 2022-09-10 NOTE — Telephone Encounter (Signed)
Post procedure follow up.  Patient states she is doing good.  

## 2022-09-11 ENCOUNTER — Other Ambulatory Visit: Payer: Self-pay | Admitting: Internal Medicine

## 2022-09-16 ENCOUNTER — Encounter: Payer: Self-pay | Admitting: Student in an Organized Health Care Education/Training Program

## 2022-09-16 DIAGNOSIS — G4733 Obstructive sleep apnea (adult) (pediatric): Secondary | ICD-10-CM | POA: Diagnosis not present

## 2022-09-19 ENCOUNTER — Other Ambulatory Visit: Payer: Self-pay | Admitting: Internal Medicine

## 2022-09-19 DIAGNOSIS — E559 Vitamin D deficiency, unspecified: Secondary | ICD-10-CM

## 2022-09-22 ENCOUNTER — Ambulatory Visit: Payer: Medicare PPO | Admitting: Dermatology

## 2022-09-28 ENCOUNTER — Ambulatory Visit: Payer: Medicare PPO | Admitting: Internal Medicine

## 2022-10-12 ENCOUNTER — Ambulatory Visit
Payer: Medicare PPO | Attending: Student in an Organized Health Care Education/Training Program | Admitting: Student in an Organized Health Care Education/Training Program

## 2022-10-12 ENCOUNTER — Encounter: Payer: Self-pay | Admitting: Student in an Organized Health Care Education/Training Program

## 2022-10-12 ENCOUNTER — Other Ambulatory Visit: Payer: Self-pay | Admitting: Nurse Practitioner

## 2022-10-12 VITALS — BP 120/89 | HR 83 | Temp 97.2°F | Ht 69.0 in | Wt 230.0 lb

## 2022-10-12 DIAGNOSIS — M19021 Primary osteoarthritis, right elbow: Secondary | ICD-10-CM | POA: Diagnosis not present

## 2022-10-12 DIAGNOSIS — Z1231 Encounter for screening mammogram for malignant neoplasm of breast: Secondary | ICD-10-CM

## 2022-10-12 MED ORDER — LIDOCAINE HCL 2 % IJ SOLN: 20.0000 mL | Freq: Once | INTRAMUSCULAR | Status: DC

## 2022-10-12 MED ORDER — METHYLPREDNISOLONE ACETATE 40 MG/ML IJ SUSP
INTRAMUSCULAR | Status: AC
  Filled 2022-10-12: qty 1

## 2022-10-12 MED ORDER — ROPIVACAINE HCL 2 MG/ML IJ SOLN
INTRAMUSCULAR | Status: AC
  Filled 2022-10-12: qty 20

## 2022-10-12 MED ORDER — METHYLPREDNISOLONE ACETATE 40 MG/ML IJ SUSP
40.0000 mg | Freq: Once | INTRAMUSCULAR | Status: AC
  Administered 2022-10-12: 40 mg

## 2022-10-12 MED ORDER — LIDOCAINE HCL 2 % IJ SOLN
INTRAMUSCULAR | Status: AC
  Filled 2022-10-12: qty 20

## 2022-10-12 MED ORDER — ROPIVACAINE HCL 2 MG/ML IJ SOLN
2.0000 mL | Freq: Once | INTRAMUSCULAR | Status: AC
  Administered 2022-10-12: 2 mL via EPIDURAL

## 2022-10-12 MED ORDER — ROPIVACAINE HCL 2 MG/ML IJ SOLN
2.0000 mL | Freq: Once | INTRAMUSCULAR | Status: AC
  Administered 2022-10-12: 2 mL via INTRA_ARTICULAR

## 2022-10-12 NOTE — Progress Notes (Signed)
Safety precautions to be maintained throughout the outpatient stay will include: orient to surroundings, keep bed in low position, maintain call bell within reach at all times, provide assistance with transfer out of bed and ambulation.  

## 2022-10-12 NOTE — Progress Notes (Signed)
PROVIDER NOTE: Interpretation of information contained herein should be left to medically-trained personnel. Specific patient instructions are provided elsewhere under "Patient Instructions" section of medical record. This document was created in part using STT-dictation technology, any transcriptional errors that may result from this process are unintentional.  Patient: Vickie Stevens Type: Established DOB: [birth year] MRN: [id number] PCP: Sherron Monday, MD  Service: Procedure DOS: 10/12/2022 Setting: Ambulatory Location: Ambulatory outpatient facility Delivery: Face-to-face Provider: Edward Jolly, MD Specialty: Interventional Pain Management Specialty designation: 09 Location: Outpatient facility Ref. Prov.: Sherron Monday, MD       Interventional Therapy   Primary Reason for Visit: Interventional Pain Management Treatment. CC: Elbow Pain (right)    Procedure:            Type: Therapeutic Elbow Joint Steroid Injection  #1  Region: Anterolateral and Posterolateral Elbow Area Level: Elbow Laterality: Right-Sided     Position: Sitting   1. Primary osteoarthritis of right elbow    NAS-11 Pain score:   Pre-procedure: 6 /10   Post-procedure: 6 /10     Pre-op H&P Assessment:  Vickie Stevens is a 52 y.o. (year old), female patient, seen today for interventional treatment. She  has a past surgical history that includes Endometrial ablation (01-2011); Shoulder surgery (Left, 07-1999, 07-2011); Cataract extraction, bilateral; Esophagogastroduodenoscopy (egd) with propofol (N/A, 06/08/2018); Colonoscopy with propofol (N/A, 06/08/2018); and SCS 8/ 2021. Vickie Stevens has a current medication list which includes the following prescription(s): accu-chek fastclix lancets, accu-chek guide, amlodipine, atorvastatin, belbuca, vitamin d3, fluticasone, lactulose, accu-chek fastclix lancet, linzess, quetiapine, valacyclovir, and vitamin d (ergocalciferol), and the following Facility-Administered  Medications: ropivacaine (pf) 2 mg/ml (0.2%). Her primarily concern today is the Elbow Pain (right)  Initial Vital Signs:  Pulse/HCG Rate: 83  Temp: (!) 97.2 F (36.2 C) Resp:   BP: 120/89 SpO2: 99 %  BMI: Estimated body mass index is 33.97 kg/m as calculated from the following:   Height as of this encounter: 5\' 9"  (1.753 m).   Weight as of this encounter: 230 lb (104.3 kg).  Risk Assessment: Allergies: Reviewed. She is allergic to phenergan [promethazine hcl], codeine, cortisone, lidocaine, and oxycodone.  Allergy Precautions: None required Coagulopathies: Reviewed. None identified.  Blood-thinner therapy: None at this time Active Infection(s): Reviewed. None identified. Vickie Stevens is afebrile  Site Confirmation: Vickie Stevens was asked to confirm the procedure and laterality before marking the site Procedure checklist: Completed Consent: Before the procedure and under the influence of no sedative(s), amnesic(s), or anxiolytics, the patient was informed of the treatment options, risks and possible complications. To fulfill our ethical and legal obligations, as recommended by the American Medical Association's Code of Ethics, I have informed the patient of my clinical impression; the nature and purpose of the treatment or procedure; the risks, benefits, and possible complications of the intervention; the alternatives, including doing nothing; the risk(s) and benefit(s) of the alternative treatment(s) or procedure(s); and the risk(s) and benefit(s) of doing nothing. The patient was provided information about the general risks and possible complications associated with the procedure. These may include, but are not limited to: failure to achieve desired goals, infection, bleeding, organ or nerve damage, allergic reactions, paralysis, and death. In addition, the patient was informed of those risks and complications associated to the procedure, such as failure to decrease pain; infection;  bleeding; organ or nerve damage with subsequent damage to sensory, motor, and/or autonomic systems, resulting in permanent pain, numbness, and/or weakness of one or several areas of the body; allergic  reactions; (i.e.: anaphylactic reaction); and/or death. Furthermore, the patient was informed of those risks and complications associated with the medications. These include, but are not limited to: allergic reactions (i.e.: anaphylactic or anaphylactoid reaction(s)); adrenal axis suppression; blood sugar elevation that in diabetics may result in ketoacidosis or comma; water retention that in patients with history of congestive heart failure may result in shortness of breath, pulmonary edema, and decompensation with resultant heart failure; weight gain; swelling or edema; medication-induced neural toxicity; particulate matter embolism and blood vessel occlusion with resultant organ, and/or nervous system infarction; and/or aseptic necrosis of one or more joints. Finally, the patient was informed that Medicine is not an exact science; therefore, there is also the possibility of unforeseen or unpredictable risks and/or possible complications that may result in a catastrophic outcome. The patient indicated having understood very clearly. We have given the patient no guarantees and we have made no promises. Enough time was given to the patient to ask questions, all of which were answered to the patient's satisfaction. Vickie Stevens has indicated that she wanted to continue with the procedure. Attestation: I, the ordering provider, attest that I have discussed with the patient the benefits, risks, side-effects, alternatives, likelihood of achieving goals, and potential problems during recovery for the procedure that I have provided informed consent. Date  Time: 10/12/2022  9:55 AM  Pre-Procedure Preparation:  Monitoring: As per clinic protocol. Respiration, ETCO2, SpO2, BP, heart rate and rhythm monitor placed and  checked for adequate function Safety Precautions: Patient was assessed for positional comfort and pressure points before starting the procedure. Time-out: I initiated and conducted the "Time-out" before starting the procedure, as per protocol. The patient was asked to participate by confirming the accuracy of the "Time Out" information. Verification of the correct person, site, and procedure were performed and confirmed by me, the nursing staff, and the patient. "Time-out" conducted as per Joint Commission's Universal Protocol (UP.01.01.01). Time: 1020 Start Time: 1020 hrs.  Description of Procedure:          Target Area: Medial epicondyle Approach: Lateral approach. Area Prepped: Entire elbow Region DuraPrep (Iodine Povacrylex [0.7% available iodine] and Isopropyl Alcohol, 74% w/w) Safety Precautions: Aspiration looking for blood return was conducted prior to all injections. At no point did we inject any substances, as a needle was being advanced. No attempts were made at seeking any paresthesias. Safe injection practices and needle disposal techniques used. Medications properly checked for expiration dates. SDV (single dose vial) medications used. Description of the Procedure: Protocol guidelines were followed. The patient was placed in position. The target area was identified and the area prepped in the usual manner. Skin & deeper tissues infiltrated with local anesthetic. Appropriate amount of time allowed to pass for local anesthetics to take effect. The procedure needles were then advanced to the target area. Proper needle placement secured. Negative aspiration confirmed. Solution injected in intermittent fashion, asking for systemic symptoms every 0.5cc of injectate. The needles were then removed and the area cleansed, making sure to leave some of the prepping solution back to take advantage of its long term bactericidal properties. Vitals:   10/12/22 0957  BP: 120/89  Pulse: 83  Temp: (!)  97.2 F (36.2 C)  TempSrc: Temporal  SpO2: 99%  Weight: 230 lb (104.3 kg)  Height: 5\' 9"  (1.753 m)    Start Time: 1020 hrs. End Time: 1026 hrs. Materials:  Needle(s) Type: Spinal Needle Gauge: 22G Length: 3.5-in Medication(s): Please see orders for medications and dosing details.  3 cc solution made of 2 cc of 0.2% ropivacaine, 1 cc of methylprednisolone, 40 mg/cc.  Injected into the left medial epicondyle under ultrasound guidance                 Imaging Guidance:          Type of Imaging Technique: Ultrasound Guidance Indication(s): Assistance in needle guidance and placement for procedures requiring needle placement in or near specific anatomical locations not easily accessible without such assistance.   Antibiotic Prophylaxis:   Anti-infectives (From admission, onward)    None      Indication(s): None identified  Post-operative Assessment:  Post-procedure Vital Signs:  Pulse/HCG Rate: 83  Temp: (!) 97.2 F (36.2 C) Resp:   BP: 120/89 SpO2: 99 %  EBL: None  Complications: No immediate post-treatment complications observed by team, or reported by patient.  Note: The patient tolerated the entire procedure well. A repeat set of vitals were taken after the procedure and the patient was kept under observation following institutional policy, for this type of procedure. Post-procedural neurological assessment was performed, showing return to baseline, prior to discharge. The patient was provided with post-procedure discharge instructions, including a section on how to identify potential problems. Should any problems arise concerning this procedure, the patient was given instructions to immediately contact us, at any time, without hesitation. In any case, we plan to contact the patient by telephone for a follow-up status report regarding this interventional procedure.  Comments:  No additional relevant information.  Plan of Care (POC)    Chronic Opioid Analgesic:   Belbuca 150 mcg twice daily    Medications ordered for procedure: Meds ordered this encounter  Medications   DISCONTD: lidocaine (XYLOCAINE) 2 % (with pres) injection 400 mg   methylPREDNISolone acetate (DEPO-MEDROL) injection 40 mg   ropivacaine (PF) 2 mg/mL (0.2%) (NAROPIN) injection 2 mL   Medications administered: We administered methylPREDNISolone acetate.  See the medical record for exact dosing, route, and time of administration.  Follow-up plan:   Return in about 6 weeks (around 11/23/2022) for VV PPE.       Status post L4-L5 ESI #1 on 10/16/2019, repeat.  Left L4 Sprint peripheral nerve stimulation 02/19/2020; removed 05/08/2020., right L4 Sprint peripheral nerve stimulation 03/13/2020, lead fracture, replaced 04/15/2020, right removed 06/20/2020.  Right axillary peripheral nerve stimulation 07/29/2020.         Recent Visits Date Type Provider Dept  09/09/22 Procedure visit Edward JollyLateef, Rakim Moone, MD [facility]  08/20/22 Office Visit Edward JollyLateef, Kirtan Sada, MD [facility]  Showing recent visits within past 90 days and meeting all other requirements Today's Visits Date Type Provider Dept  10/12/22 Procedure visit Edward JollyLateef, Semaje Kinker, MD [facility]  Showing today's visits and meeting all other requirements Future Appointments No visits were found meeting these conditions. Showing future appointments within next 90 days and meeting all other requirements  Disposition: Discharge home  Discharge (Date  Time): 10/12/2022; 1028 hrs.   Primary Care Physician: Sherron Mondayejan-Sie, S Ahmed, MD [facility] Note by: Edward JollyBilal Charae Depaolis, MD (TTS technology used. I apologize for any typographical errors that were not detected and corrected.) Date: 10/12/2022; Time: 10:51 AM  Disclaimer:  Medicine is not an Visual merchandiserexact science. The only guarantee in medicine is that nothing is guaranteed. It is important to note that the decision to proceed with this intervention  was based on the information collected from the patient. The Data and conclusions were drawn from the patient's questionnaire, the interview,  and the physical examination. Because the information was provided in large part by the patient, it cannot be guaranteed that it has not been purposely or unconsciously manipulated. Every effort has been made to obtain as much relevant data as possible for this evaluation. It is important to note that the conclusions that lead to this procedure are derived in large part from the available data. Always take into account that the treatment will also be dependent on availability of resources and existing treatment guidelines, considered by other Pain Management Practitioners as being common knowledge and practice, at the time of the intervention. For Medico-Legal purposes, it is also important to point out that variation in procedural techniques and pharmacological choices are the acceptable norm. The indications, contraindications, technique, and results of the above procedure should only be interpreted and judged by a Board-Certified Interventional Pain Specialist with extensive familiarity and expertise in the same exact procedure and technique.

## 2022-10-12 NOTE — Patient Instructions (Signed)

## 2022-10-13 ENCOUNTER — Other Ambulatory Visit: Payer: Self-pay | Admitting: Internal Medicine

## 2022-10-13 DIAGNOSIS — S43429A Sprain of unspecified rotator cuff capsule, initial encounter: Secondary | ICD-10-CM | POA: Diagnosis not present

## 2022-10-13 DIAGNOSIS — E119 Type 2 diabetes mellitus without complications: Secondary | ICD-10-CM | POA: Diagnosis not present

## 2022-10-13 DIAGNOSIS — H4322 Crystalline deposits in vitreous body, left eye: Secondary | ICD-10-CM | POA: Diagnosis not present

## 2022-10-13 DIAGNOSIS — H524 Presbyopia: Secondary | ICD-10-CM | POA: Diagnosis not present

## 2022-10-13 DIAGNOSIS — Z961 Presence of intraocular lens: Secondary | ICD-10-CM | POA: Diagnosis not present

## 2022-10-13 DIAGNOSIS — M5030 Other cervical disc degeneration, unspecified cervical region: Secondary | ICD-10-CM | POA: Diagnosis not present

## 2022-10-13 DIAGNOSIS — M47816 Spondylosis without myelopathy or radiculopathy, lumbar region: Secondary | ICD-10-CM | POA: Diagnosis not present

## 2022-10-13 DIAGNOSIS — Z0100 Encounter for examination of eyes and vision without abnormal findings: Secondary | ICD-10-CM | POA: Diagnosis not present

## 2022-10-14 ENCOUNTER — Ambulatory Visit (INDEPENDENT_AMBULATORY_CARE_PROVIDER_SITE_OTHER): Payer: Medicare PPO | Admitting: Dermatology

## 2022-10-14 ENCOUNTER — Ambulatory Visit: Payer: Medicare PPO | Admitting: Internal Medicine

## 2022-10-14 VITALS — BP 134/89 | HR 85

## 2022-10-14 DIAGNOSIS — L821 Other seborrheic keratosis: Secondary | ICD-10-CM | POA: Diagnosis not present

## 2022-10-14 DIAGNOSIS — D1801 Hemangioma of skin and subcutaneous tissue: Secondary | ICD-10-CM

## 2022-10-14 DIAGNOSIS — D692 Other nonthrombocytopenic purpura: Secondary | ICD-10-CM | POA: Diagnosis not present

## 2022-10-14 DIAGNOSIS — T148XXA Other injury of unspecified body region, initial encounter: Secondary | ICD-10-CM

## 2022-10-14 MED ORDER — CLOBETASOL PROPIONATE 0.05 % EX OINT: 1.0000 | TOPICAL_OINTMENT | Freq: Two times a day (BID) | CUTANEOUS | 0 refills | Status: DC

## 2022-10-14 NOTE — Progress Notes (Signed)
   Follow Up Visit   Subjective  Vickie Stevens is a 52 y.o. female who presents for the following: Rash,  Occurs after using products with adhesives, becomes blistered and scabbed, patient would like to discuss allergy testing. She mentioned it to her PCP, but they said she would have to see derm. Crusted lesion on the neck, growing larger, changing, patient would like checked today. Red spots on the legs, would like checked today.   The following portions of the chart were reviewed this encounter and updated as appropriate: medications, allergies, medical history  Review of Systems:  No other skin or systemic complaints except as noted in HPI or Assessment and Plan.  Objective  Well appearing patient in no apparent distress; mood and affect are within normal limits.  A focused examination was performed of the following areas: The chest and arms   Relevant exam findings are noted in the Assessment and Plan.    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 Condition is symptomatic/ bothersome to patient. Not currently at goal as this is significantly bothersome and she does not have a treatment to clear it when it happens.  Treatment Plan:  Discussed patch testing and extended patch testing at [facility]. Apply clobetasol 0.05% ointment twice a day as needed to affected areas for up to 2 weeks. Avoid applying to face, groin, and axilla. Use as directed. Long-term use can cause thinning of the skin. Recommend barrier cream between adhesive and skin.   Topical steroids (such as triamcinolone, fluocinolone, fluocinonide, mometasone, clobetasol, halobetasol, betamethasone, hydrocortisone) can cause thinning and lightening of the skin if they are used for too long in the same area. Your physician has selected the right strength medicine for your  problem and area affected on the body. Please use your medication only as directed by your physician to prevent side effects.    Scattered perifollicular petechiae, easy bruising, and cork screw hairs.   Suspicious for vitamin C deficiency - will order vitamin C level  HEMANGIOMA Exam: red papule(s) Discussed benign nature. Recommend observation. Call for changes.  SEBORRHEIC KERATOSIS - post neck - Stuck-on, waxy, tan-brown papules and/or plaques  - Benign-appearing - Discussed benign etiology and prognosis. 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 Avoid applying to face, groin, and axilla. Use as directed. Long-term use can cause thinning of the skin.  Topical steroids (such as triamcinolone, fluocinolone, fluocinonide, mometasone, clobetasol, halobetasol, betamethasone, hydrocortisone) can cause thinning and lightening of the skin if they are used for too long in the same area. Your physician has selected the right strength medicine for your problem and area affected on the body. Please use your medication only as directed by your physician to prevent side effects.   Return in about 3 months (around 01/13/2023) for rash follow up.  Maylene Roes, CMA, am acting as scribe for Darden Dates, MD .  Documentation: I have reviewed the above documentation for accuracy and completeness, and I agree with the above.  Darden Dates, MD

## 2022-10-14 NOTE — Patient Instructions (Signed)
Due to recent changes in healthcare laws, you may see results of your pathology and/or laboratory studies on MyChart before the doctors have had a chance to review them. We understand that in some cases there may be results that are confusing or concerning to you. Please understand that not all results are received at the same time and often the doctors may need to interpret multiple results in order to provide you with the best plan of care or course of treatment. Therefore, we ask that you please give us 2 business days to thoroughly review all your results before contacting the office for clarification. Should we see a critical lab result, you will be contacted sooner.   If You Need Anything After Your Visit  If you have any questions or concerns for your doctor, please call our main line at [phone] and press option 4 to reach your doctor's medical assistant. If no one answers, please leave a voicemail as directed and we will return your call as soon as possible. Messages left after 4 pm will be answered the following business day.   You may also send us a message via MyChart. We typically respond to MyChart messages within 1-2 business days.  For prescription refills, please ask your pharmacy to contact our office. Our fax number is [phone].  If you have an urgent issue when the clinic is closed that cannot wait until the next business day, you can page your doctor at the number below.    Please note that while we do our best to be available for urgent issues outside of office hours, we are not available 24/7.   If you have an urgent issue and are unable to reach us, you may choose to seek medical care at your doctor's office, retail clinic, urgent care center, or emergency room.  If you have a medical emergency, please immediately call 911 or go to the emergency department.  Pager Numbers  - Dr. Kowalski: [phone]  - Dr. Moye: [phone]  - Dr. Stewart:  [phone]  In the event of inclement weather, please call our main line at [phone] for an update on the status of any delays or closures.  Dermatology Medication Tips: Please keep the boxes that topical medications come in in order to help keep track of the instructions about where and how to use these. Pharmacies typically print the medication instructions only on the boxes and not directly on the medication tubes.   If your medication is too expensive, please contact our office at [phone] option 4 or send us a message through MyChart.   We are unable to tell what your co-pay for medications will be in advance as this is different depending on your insurance coverage. However, we may be able to find a substitute medication at lower cost or fill out paperwork to get insurance to cover a needed medication.   If a prior authorization is required to get your medication covered by your insurance company, please allow us 1-2 business days to complete this process.  Drug prices often vary depending on where the prescription is filled and some pharmacies may offer cheaper prices.  The website www.goodrx.com contains coupons for medications through different pharmacies. The prices here do not account for what the cost may be with help from insurance (it may be cheaper with your insurance), but the website can give you the price if you did not use any insurance.  - You can print the associated coupon and take it with   your prescription to the pharmacy.  - You may also stop by our office during regular business hours and pick up a GoodRx coupon card.  - If you need your prescription sent electronically to a different pharmacy, notify our office through [place] MyChart or by phone at [phone] option 4.     Si Usted Necesita Algo Despus de Su Visita  Tambin puede enviarnos un mensaje a travs de MyChart. Por lo general respondemos a los mensajes de MyChart en el transcurso de 1 a 2  das hbiles.  Para renovar recetas, por favor pida a su farmacia que se ponga en contacto con nuestra oficina. Nuestro nmero de fax es el [phone].  Si tiene un asunto urgente cuando la clnica est cerrada y que no puede esperar hasta el siguiente da hbil, puede llamar/localizar a su doctor(a) al nmero que aparece a continuacin.   Por favor, tenga en cuenta que aunque hacemos todo lo posible para estar disponibles para asuntos urgentes fuera del horario de oficina, no estamos disponibles las 24 horas del da, los 7 das de la semana.   Si tiene un problema urgente y no puede comunicarse con nosotros, puede optar por buscar atencin mdica  en el consultorio de su doctor(a), en una clnica privada, en un centro de atencin urgente o en una sala de emergencias.  Si tiene una emergencia mdica, por favor llame inmediatamente al 911 o vaya a la sala de emergencias.  Nmeros de bper  - Dr. Kowalski: [phone]  - Dra. Moye: [phone]  - Dra. Stewart: [phone]  En caso de inclemencias del tiempo, por favor llame a nuestra lnea principal al [phone] para una actualizacin sobre el estado de cualquier retraso o cierre.  Consejos para la medicacin en dermatologa: Por favor, guarde las cajas en las que vienen los medicamentos de uso tpico para ayudarle a seguir las instrucciones sobre dnde y cmo usarlos. Las farmacias generalmente imprimen las instrucciones del medicamento slo en las cajas y no directamente en los tubos del medicamento.   Si su medicamento es muy caro, por favor, pngase en contacto con nuestra oficina llamando al [phone] y presione la opcin 4 o envenos un mensaje a travs de MyChart.   No podemos decirle cul ser su copago por los medicamentos por adelantado ya que esto es diferente dependiendo de la cobertura de su seguro. Sin embargo, es posible que podamos encontrar un medicamento sustituto a menor costo o llenar un formulario para que el  seguro cubra el medicamento que se considera necesario.   Si se requiere una autorizacin previa para que su compaa de seguros cubra su medicamento, por favor permtanos de 1 a 2 das hbiles para completar este proceso.  Los precios de los medicamentos varan con frecuencia dependiendo del lugar de dnde se surte la receta y alguna farmacias pueden ofrecer precios ms baratos.  El sitio web www.goodrx.com tiene cupones para medicamentos de diferentes farmacias. Los precios aqu no tienen en cuenta lo que podra costar con la ayuda del seguro (puede ser ms barato con su seguro), pero el sitio web puede darle el precio si no utiliz ningn seguro.  - Puede imprimir el cupn correspondiente y llevarlo con su receta a la farmacia.  - Tambin puede pasar por nuestra oficina durante el horario de atencin regular y recoger una tarjeta de cupones de GoodRx.  - Si necesita que su receta se enve electrnicamente a una farmacia diferente, informe a nuestra oficina a travs de MyChart de [place]   o por telfono llamando al [phone] y presione la opcin 4.  

## 2022-10-15 DIAGNOSIS — K5909 Other constipation: Secondary | ICD-10-CM | POA: Diagnosis not present

## 2022-10-15 DIAGNOSIS — T148XXA Other injury of unspecified body region, initial encounter: Secondary | ICD-10-CM | POA: Diagnosis not present

## 2022-10-15 DIAGNOSIS — K219 Gastro-esophageal reflux disease without esophagitis: Secondary | ICD-10-CM | POA: Diagnosis not present

## 2022-10-15 DIAGNOSIS — D692 Other nonthrombocytopenic purpura: Secondary | ICD-10-CM | POA: Diagnosis not present

## 2022-10-19 ENCOUNTER — Encounter: Payer: Self-pay | Admitting: Internal Medicine

## 2022-10-19 LAB — VITAMIN C: Vitamin C: 0.6 mg/dL (ref 0.4–2.0)

## 2022-10-21 ENCOUNTER — Telehealth: Payer: Self-pay

## 2022-10-21 NOTE — Telephone Encounter (Signed)
Discussed lab results and recommendations for Vit. C levels. Patient reports her endocrinologist has told her to avoid fruits. She states she has low blood sugar and fruits cause crashes. Recommend vegetables high in Vit. C or can take a Vit. C supplement.

## 2022-10-21 NOTE — Telephone Encounter (Signed)
-----   Message from [place], MD sent at 10/20/2022  2:42 PM EDT ----- Vitamin C normal (at low end of normal) --> recommend fruits and vegetables to keep this in the normal range  MAs please call. Thank you!

## 2022-10-22 NOTE — Telephone Encounter (Signed)
Thank you :)

## 2022-10-25 ENCOUNTER — Encounter: Payer: Self-pay | Admitting: Dermatology

## 2022-10-26 ENCOUNTER — Encounter: Payer: Self-pay | Admitting: *Deleted

## 2022-10-26 ENCOUNTER — Ambulatory Visit: Payer: Medicare PPO | Admitting: Gastroenterology

## 2022-10-26 ENCOUNTER — Encounter: Payer: Self-pay | Admitting: Student in an Organized Health Care Education/Training Program

## 2022-11-10 ENCOUNTER — Ambulatory Visit
Admission: RE | Admit: 2022-11-10 | Discharge: 2022-11-10 | Disposition: A | Discharge status: Home / Self Care | Payer: Medicare PPO | Source: Ambulatory Visit | Attending: Nurse Practitioner | Admitting: Nurse Practitioner

## 2022-11-10 ENCOUNTER — Other Ambulatory Visit: Payer: Medicare PPO

## 2022-11-10 ENCOUNTER — Other Ambulatory Visit: Payer: Self-pay | Admitting: Internal Medicine

## 2022-11-10 DIAGNOSIS — Z1231 Encounter for screening mammogram for malignant neoplasm of breast: Secondary | ICD-10-CM | POA: Diagnosis not present

## 2022-11-10 DIAGNOSIS — E119 Type 2 diabetes mellitus without complications: Secondary | ICD-10-CM

## 2022-11-10 DIAGNOSIS — I10 Essential (primary) hypertension: Secondary | ICD-10-CM

## 2022-11-10 DIAGNOSIS — E782 Mixed hyperlipidemia: Secondary | ICD-10-CM

## 2022-11-11 LAB — COMPREHENSIVE METABOLIC PANEL
ALT: 16 IU/L (ref 0–32)
AST: 20 IU/L (ref 0–40)
Albumin/Globulin Ratio: 2 (ref 1.2–2.2)
Albumin: 4 g/dL (ref 3.9–4.9)
Alkaline Phosphatase: 130 IU/L — ABNORMAL HIGH (ref 44–121)
BUN/Creatinine Ratio: 20 (ref 9–23)
BUN: 12 mg/dL (ref 6–24)
Bilirubin Total: 0.4 mg/dL (ref 0.0–1.2)
CO2: 20 mmol/L (ref 20–29)
Calcium: 9.4 mg/dL (ref 8.7–10.2)
Chloride: 107 mmol/L — ABNORMAL HIGH (ref 96–106)
Creatinine, Ser: 0.59 mg/dL (ref 0.57–1.00)
Globulin, Total: 2 g/dL (ref 1.5–4.5)
Glucose: 125 mg/dL — ABNORMAL HIGH (ref 70–99)
Potassium: 4.7 mmol/L (ref 3.5–5.2)
Sodium: 144 mmol/L (ref 134–144)
Total Protein: 6 g/dL (ref 6.0–8.5)
eGFR: 110 mL/min/{1.73_m2} (ref >59)

## 2022-11-11 LAB — LIPID PANEL
Chol/HDL Ratio: 2.4 ratio (ref 0.0–4.4)
Cholesterol, Total: 161 mg/dL (ref 100–199)
HDL: 66 mg/dL (ref >39)
LDL Chol Calc (NIH): 81 mg/dL (ref 0–99)
Triglycerides: 75 mg/dL (ref 0–149)
VLDL Cholesterol Cal: 14 mg/dL (ref 5–40)

## 2022-11-11 LAB — HEMOGLOBIN A1C
Est. average glucose Bld gHb Est-mCnc: 143 mg/dL
Hgb A1c MFr Bld: 6.6 % — ABNORMAL HIGH (ref 4.8–5.6)

## 2022-11-12 DIAGNOSIS — M47816 Spondylosis without myelopathy or radiculopathy, lumbar region: Secondary | ICD-10-CM | POA: Diagnosis not present

## 2022-11-12 DIAGNOSIS — M5030 Other cervical disc degeneration, unspecified cervical region: Secondary | ICD-10-CM | POA: Diagnosis not present

## 2022-11-12 DIAGNOSIS — S43429A Sprain of unspecified rotator cuff capsule, initial encounter: Secondary | ICD-10-CM | POA: Diagnosis not present

## 2022-11-13 ENCOUNTER — Ambulatory Visit (INDEPENDENT_AMBULATORY_CARE_PROVIDER_SITE_OTHER): Payer: Medicare PPO | Admitting: Internal Medicine

## 2022-11-13 ENCOUNTER — Encounter: Payer: Self-pay | Admitting: Internal Medicine

## 2022-11-13 VITALS — BP 142/90 | HR 79 | Ht 67.5 in | Wt 240.0 lb

## 2022-11-13 DIAGNOSIS — K5909 Other constipation: Secondary | ICD-10-CM | POA: Diagnosis not present

## 2022-11-13 DIAGNOSIS — G7111 Myotonic muscular dystrophy: Secondary | ICD-10-CM | POA: Diagnosis not present

## 2022-11-13 DIAGNOSIS — E119 Type 2 diabetes mellitus without complications: Secondary | ICD-10-CM | POA: Diagnosis not present

## 2022-11-13 DIAGNOSIS — D469 Myelodysplastic syndrome, unspecified: Secondary | ICD-10-CM | POA: Diagnosis not present

## 2022-11-13 DIAGNOSIS — E782 Mixed hyperlipidemia: Secondary | ICD-10-CM | POA: Diagnosis not present

## 2022-11-13 DIAGNOSIS — I10 Essential (primary) hypertension: Secondary | ICD-10-CM

## 2022-11-13 MED ORDER — OLMESARTAN MEDOXOMIL 20 MG PO TABS
20.0000 mg | ORAL_TABLET | Freq: Every day | ORAL | 0 refills | Status: DC
Start: 2022-11-13 — End: 2023-02-11

## 2022-11-13 MED ORDER — LINACLOTIDE 290 MCG PO CAPS
290.0000 ug | ORAL_CAPSULE | Freq: Every day | ORAL | 2 refills | Status: DC
Start: 2022-11-13 — End: 2023-02-11

## 2022-11-13 NOTE — Progress Notes (Signed)
Established Patient Office Visit  Subjective:  Patient ID: Vickie Stevens, female    DOB: 1971/06/23  Age: 52 y.o. MRN: [id number]  Chief Complaint  Patient presents with   Follow-up    Follow up    No new complaints, here for lab review and medication refills. Labs reviewed and notable for well controlled diabetes, A1c at target, lipids at target with unremarkable cmp. Denies any hypoglycemic episodes and home bg readings have been at target. BP has also been elevated lately.    No other concerns at this time.   Past Medical History:  Diagnosis Date   ADHD (attention deficit hyperactivity disorder)    Biceps tendonitis    Bipolar disorder (HCC)    Chronic back pain    Cluster headache    Crohn'Anique Beckley disease (HCC)    DDD (degenerative disc disease), lumbar    Depression    Diabetes mellitus without complication (HCC)    Hypercholesteremia    Hypertension    PCOS (polycystic ovarian syndrome)    Rotator cuff tendinitis    Sleep apnea    Tuberous sclerosis (HCC)     Past Surgical History:  Procedure Laterality Date   CATARACT EXTRACTION, BILATERAL     COLONOSCOPY WITH PROPOFOL N/A 06/08/2018   Procedure: COLONOSCOPY WITH PROPOFOL;  Surgeon: Christena Deem, MD;  Location: [facility] ENDOSCOPY;  Service: Endoscopy;  Laterality: N/A;   ENDOMETRIAL ABLATION  01-2011   ESOPHAGOGASTRODUODENOSCOPY (EGD) WITH PROPOFOL N/A 06/08/2018   Procedure: ESOPHAGOGASTRODUODENOSCOPY (EGD) WITH PROPOFOL;  Surgeon: Christena Deem, MD;  Location: [facility] ENDOSCOPY;  Service: Endoscopy;  Laterality: N/A;   SCS 8/ 2021     SHOULDER SURGERY Left 07-1999, 07-2011   x 4    Social History   Socioeconomic History   Marital status: Divorced    Spouse name: Not on file   Number of children: Not on file   Years of education: Not on file   Highest education level: Not on file  Occupational History   Not on file  Tobacco Use   Smoking status: Never   Smokeless tobacco: Never  Vaping Use   Vaping  Use: Never used  Substance and Sexual Activity   Alcohol use: No   Drug use: Never   Sexual activity: Not on file  Other Topics Concern   Not on file  Social History Narrative   Not on file   Social Determinants of Health   Financial Resource Strain: Not on file  Food Insecurity: Not on file  Transportation Needs: Not on file  Physical Activity: Not on file  Stress: Not on file  Social Connections: Not on file  Intimate Partner Violence: Not on file    Family History  Problem Relation Age of Onset   Diabetes Father    Hyperlipidemia Father    Hypertension Father    Breast cancer Maternal Aunt     Allergies  Allergen Reactions   Phenergan [Promethazine Hcl] Other (See Comments)    Altered mental status; patient remarked that she ripped out her IV and tore up the hospital room   Codeine Hives   Cortisone Other (See Comments)    Steroid injections may have contributed to cataracts   Lidocaine Hives    With oral, only   Oxycodone Diarrhea and Rash    Pruritic rash, also    Review of Systems  Constitutional: Negative.        Gained weight  HENT: Negative.    Eyes: Negative.   Respiratory:  Negative.    Cardiovascular: Negative.   Gastrointestinal: Negative.   Genitourinary: Negative.   Skin: Negative.   Neurological: Negative.   Endo/Heme/Allergies: Negative.        Objective:   BP (!) 142/90   Pulse 79   Ht 5' 7.5" (1.715 m)   Wt 240 lb (108.9 kg)   SpO2 98%   BMI 37.03 kg/m   Vitals:   11/13/22 1552  BP: (!) 142/90  Pulse: 79  Height: 5' 7.5" (1.715 m)  Weight: 240 lb (108.9 kg)  SpO2: 98%  BMI (Calculated): 37.01    Physical Exam Vitals reviewed.  Constitutional:      General: She is not in acute distress. HENT:     Head: Normocephalic.     Nose: Nose normal.     Mouth/Throat:     Mouth: Mucous membranes are moist.  Eyes:     Extraocular Movements: Extraocular movements intact.     Pupils: Pupils are equal, round, and reactive to  light.  Cardiovascular:     Rate and Rhythm: Normal rate and regular rhythm.     Heart sounds: No murmur heard. Pulmonary:     Effort: Pulmonary effort is normal.     Breath sounds: No rhonchi or rales.  Abdominal:     General: Abdomen is flat.     Palpations: There is no hepatomegaly, splenomegaly or mass.  Musculoskeletal:        General: Normal range of motion.     Cervical back: Normal range of motion. No tenderness.  Skin:    General: Skin is warm and dry.  Neurological:     General: No focal deficit present.     Mental Status: She is alert and oriented to person, place, and time.     Cranial Nerves: No cranial nerve deficit.     Motor: No weakness.  Psychiatric:        Mood and Affect: Mood normal.        Behavior: Behavior normal.      No results found for any visits on 11/13/22.  Recent Results (from the past 2160 hour(Therisa Mennella))  POCT Urinalysis Dipstick (45409)     Status: Abnormal   Collection Time: 09/04/22  3:32 PM  Result Value Ref Range   Color, UA orange    Clarity, UA cloudy    Glucose, UA Positive (A) Negative   Bilirubin, UA negative    Ketones, UA negative    Spec Grav, UA >=1.030 (A) 1.010 - 1.025   Blood, UA negative    pH, UA 6.0 5.0 - 8.0   Protein, UA Positive (A) Negative   Urobilinogen, UA 0.2 0.2 or 1.0 E.U./dL   Nitrite, UA positive    Leukocytes, UA Negative Negative   Appearance orange    Odor yes   Culture, Urine     Status: Abnormal   Collection Time: 09/04/22  6:21 PM   Specimen: Urine   Urine  Result Value Ref Range   Urine Culture, Routine Final report (A)    Organism ID, Bacteria Escherichia coli (A)     Comment: Cefazolin <=4 ug/mL Cefazolin with an MIC <=16 predicts susceptibility to the oral agents cefaclor, cefdinir, cefpodoxime, cefprozil, cefuroxime, cephalexin, and loracarbef when used for therapy of uncomplicated urinary tract infections due to E. coli, Klebsiella pneumoniae, and Proteus mirabilis. Greater than 100,000  colony forming units per mL    Antimicrobial Susceptibility Comment     Comment:       ** Toney Difatta =  Susceptible; I = Intermediate; R = Resistant **                    P = Positive; N = Negative             MICS are expressed in micrograms per mL    Antibiotic                 RSLT#1    RSLT#2    RSLT#3    RSLT#4 Amoxicillin/Clavulanic Acid    Graysen Depaula Ampicillin                     Parag Dorton Cefepime                       Rafiel Mecca Ceftriaxone                    Missi Mcmackin Cefuroxime                     Marva Hendryx Ciprofloxacin                  Haelee Bolen Ertapenem                      Rital Cavey Gentamicin                     Henchy Mccauley Imipenem                       Cynthia Cogle Levofloxacin                   Wetona Viramontes Meropenem                      Thao Vanover Nitrofurantoin                 Nevin Kozuch Piperacillin/Tazobactam        Katonya Blecher Tetracycline                   Zuri Lascala Tobramycin                     Jezelle Gullick Trimethoprim/Sulfa             R   Vitamin C     Status: None   Collection Time: 10/15/22  2:23 PM  Result Value Ref Range   Vitamin C 0.6 0.4 - 2.0 mg/dL    Comment: Vitamin C deficiency is generally defined as plasma or serum concentrations less than 0.2 mg/dL and levels between 0.2 and 0.4 mg/dL are considered low.   Comprehensive metabolic panel     Status: Abnormal   Collection Time: 11/10/22 12:02 PM  Result Value Ref Range   Glucose 125 (H) 70 - 99 mg/dL   BUN 12 6 - 24 mg/dL   Creatinine, Ser 1.61 0.57 - 1.00 mg/dL   eGFR 096 >04 VW/UJW/1.19   BUN/Creatinine Ratio 20 9 - 23   Sodium 144 134 - 144 mmol/L   Potassium 4.7 3.5 - 5.2 mmol/L   Chloride 107 (H) 96 - 106 mmol/L   CO2 20 20 - 29 mmol/L   Calcium 9.4 8.7 - 10.2 mg/dL   Total Protein 6.0 6.0 - 8.5 g/dL   Albumin 4.0 3.9 - 4.9 g/dL   Globulin, Total 2.0 1.5 - 4.5 g/dL   Albumin/Globulin Ratio 2.0 1.2 - 2.2   Bilirubin Total 0.4 0.0 -  1.2 mg/dL   Alkaline Phosphatase 130 (H) 44 - 121 IU/L   AST 20 0 - 40 IU/L   ALT 16 0 - 32 IU/L  Lipid panel     Status: None   Collection Time: 11/10/22 12:02 PM  Result Value Ref  Range   Cholesterol, Total 161 100 - 199 mg/dL   Triglycerides 75 0 - 149 mg/dL   HDL 66 >16 mg/dL   VLDL Cholesterol Cal 14 5 - 40 mg/dL   LDL Chol Calc (NIH) 81 0 - 99 mg/dL   Chol/HDL Ratio 2.4 0.0 - 4.4 ratio    Comment:                                   T. Chol/HDL Ratio                                             Men  Women                               1/2 Avg.Risk  3.4    3.3                                   Avg.Risk  5.0    4.4                                2X Avg.Risk  9.6    7.1                                3X Avg.Risk 23.4   11.0   Hemoglobin A1c     Status: Abnormal   Collection Time: 11/10/22 12:02 PM  Result Value Ref Range   Hgb A1c MFr Bld 6.6 (H) 4.8 - 5.6 %    Comment:          Prediabetes: 5.7 - 6.4          Diabetes: >6.4          Glycemic control for adults with diabetes: <7.0    Est. average glucose Bld gHb Est-mCnc 143 mg/dL      Assessment & Plan:   Problem List Items Addressed This Visit   None   No follow-ups on file.   Total time spent: 30 minutes  Luna Fuse, MD  11/13/2022

## 2022-11-21 ENCOUNTER — Other Ambulatory Visit: Payer: Self-pay | Admitting: Internal Medicine

## 2022-11-23 ENCOUNTER — Encounter: Payer: Self-pay | Admitting: Student in an Organized Health Care Education/Training Program

## 2022-11-23 ENCOUNTER — Ambulatory Visit
Payer: Medicare PPO | Attending: Student in an Organized Health Care Education/Training Program | Admitting: Student in an Organized Health Care Education/Training Program

## 2022-11-23 ENCOUNTER — Encounter: Payer: Self-pay | Admitting: Internal Medicine

## 2022-11-23 DIAGNOSIS — M25552 Pain in left hip: Secondary | ICD-10-CM

## 2022-11-23 DIAGNOSIS — M19021 Primary osteoarthritis, right elbow: Secondary | ICD-10-CM | POA: Diagnosis not present

## 2022-11-23 DIAGNOSIS — M1612 Unilateral primary osteoarthritis, left hip: Secondary | ICD-10-CM

## 2022-11-23 NOTE — Progress Notes (Signed)
Patient: Vickie Stevens  Service Category: E/M  Provider: Edward Jolly, MD  DOB: [birth year]  DOS: 11/23/2022  Location: Office  MRN: [id number]  Setting: Ambulatory outpatient  Referring Provider: Sherron Monday, MD  Type: Established Patient  Specialty: Interventional Pain Management  PCP: Sherron Monday, MD  Location: Remote location  Delivery: TeleHealth     Virtual Encounter - Pain Management PROVIDER NOTE: Information contained herein reflects review and annotations entered in association with encounter. Interpretation of such information and data should be left to medically-trained personnel. Information provided to patient can be located elsewhere in the medical record under "Patient Instructions". Document created using STT-dictation technology, any transcriptional errors that may result from process are unintentional.    Contact & Pharmacy Preferred: [phone] Home: [phone] (home) Mobile: [phone] (mobile) E-mail: [email or web address]  [facility] - [place], [place] - [address] [place] [address] Phone: [phone] Fax: [phone]  [facility] [place] - [address] [place] [address] Phone: [phone] Fax: [phone]  [facility] [address] [place], [place] - [address] [place] [address] Phone: [phone] Fax: [phone]  [facility] [address] [place] - [address] [place] [address] Phone: [phone] Fax: [phone]  [facility] - [place], [place] - [address] [place] [address] Phone: [phone] Fax: [phone]   Pre-screening  Vickie Stevens offered "in-person" vs "virtual" encounter. She indicated preferring virtual for this encounter.   Reason COVID-19*  Social distancing based on CDC and AMA recommendations.   I contacted Vickie Stevens on 11/23/2022  via telephone.      I clearly identified myself as Edward Jolly, MD. I verified that I was speaking with the correct person using two identifiers (Name: HEIDIE KELLEN, and date of birth: 04/08/1971).  Consent I sought verbal advanced consent from Vickie Stevens for virtual visit interactions. I informed Vickie Stevens of possible security and privacy concerns, risks, and limitations associated with providing "not-in-person" medical evaluation and management services. I also informed Vickie Stevens of the availability of "in-person" appointments. Finally, I informed her that there would be a charge for the virtual visit and that she could be  personally, fully or partially, financially responsible for it. Vickie Stevens expressed understanding and agreed to proceed.   Historic Elements   Vickie Stevens is a 52 y.o. year old, female patient evaluated today after our last contact on 10/12/2022. Vickie Stevens  has a past medical history of ADHD (attention deficit hyperactivity disorder), Biceps tendonitis, Bipolar disorder (HCC), Chronic back pain, Cluster headache, Crohn's disease (HCC), DDD (degenerative disc disease), lumbar, Depression, Diabetes mellitus without complication (HCC), Hypercholesteremia, Hypertension, PCOS (polycystic ovarian syndrome), Rotator cuff tendinitis, Sleep apnea, and Tuberous sclerosis (HCC). She also  has a past surgical history that includes Endometrial ablation (01-2011); Shoulder surgery (Left, 07-1999, 07-2011); Cataract extraction, bilateral; Esophagogastroduodenoscopy (egd) with propofol (N/A, 06/08/2018); Colonoscopy with propofol (N/A, 06/08/2018); and SCS 8/ 2021. Vickie Stevens has a current medication list which includes the following prescription(s): accu-chek fastclix lancets, accu-chek guide, amlodipine, belbuca, vitamin d3, clobetasol ointment, fluticasone, accu-chek fastclix lancet, lubiprostone, mexiletine, olmesartan, quetiapine, valacyclovir, vitamin d (ergocalciferol),  atorvastatin, lactulose, and linaclotide. She  reports that she has never smoked. She has never used smokeless tobacco. She reports that she does not drink alcohol and does not use drugs. Vickie Stevens is allergic  to phenergan [promethazine hcl], codeine, cortisone, lidocaine, and oxycodone.  BMI: Estimated body mass index is 37.03 kg/m as calculated from the following:   Height as of 11/13/22: 5' 7.5" (1.715 m).   Weight as of 11/13/22: 240 lb (108.9 kg). Last encounter: 08/20/2022. Last procedure: 10/12/2022.  HPI  Today, she is being contacted for a post-procedure assessment.   Post-procedure evaluation    Procedure:            Type: Therapeutic Elbow Joint Steroid Injection  #1  Region: Anterolateral and Posterolateral Elbow Area Level: Elbow Laterality: Right-Sided     Position: Sitting   1. Primary osteoarthritis of right elbow    NAS-11 Pain score:   Pre-procedure: 6 /10   Post-procedure: 6 /10      Effectiveness:  Initial hour after procedure: 0 %  Subsequent 4-6 hours post-procedure: 0 %  Analgesia past initial 6 hours: 95 %  Ongoing improvement:  Analgesic:  95%   Pharmacotherapy Assessment   Opioid Analgesic: Belbuca 150 mcg twice daily    Monitoring: [place] PMP: PDMP not reviewed this encounter.       Pharmacotherapy: No side-effects or adverse reactions reported. Compliance: No problems identified. Effectiveness: Clinically acceptable. Plan: Refer to "POC". UDS:  Summary  Date Value Ref Range Status  03/05/2022 Note  Final    Comment:    ==================================================================== [employer]ox[employer] Select 13 (MW) ==================================================================== Test                             Result       Flag       Units  Drug Present and Declared for Prescription Verification   Buprenorphine                  4            EXPECTED   ng/mg creat   Norbuprenorphine               14           EXPECTED   ng/mg creat     Source of buprenorphine is a scheduled prescription medication.    Norbuprenorphine is an expected metabolite of buprenorphine.  ==================================================================== Test                      Result    Flag   Units      Ref Range   Creatinine              190              mg/dL      >=16 ==================================================================== Declared Medications:  The flagging and interpretation on this report are based on the  following declared medications.  Unexpected results may arise from  inaccuracies in the declared medications.   **Note: The testing scope of this panel does not include small to  moderate amounts of these reported medications:   Buprenorphine (Belbuca)   **Note: The testing scope of this panel does not include the  following reported medications:   Amlodipine (Norvasc)  Atorvastatin (Lipitor) ==================================================================== For clinical consultation, please call [phone]. ====================================================================    No results found for: "CBDTHCR", "D8THCCBX", "D9THCCBX"   Laboratory Chemistry Profile   Renal Lab Results  Component Value Date   BUN 12 11/10/2022   CREATININE 0.59 11/10/2022   BCR 20 11/10/2022   GFRAA >60 12/08/2017   GFRNONAA >60 12/08/2017  Hepatic Lab Results  Component Value Date   AST 20 11/10/2022   ALT 16 11/10/2022   ALBUMIN 4.0 11/10/2022   ALKPHOS 130 (H) 11/10/2022    Electrolytes Lab Results  Component Value Date   NA 144 11/10/2022   K 4.7 11/10/2022   CL 107 (H) 11/10/2022   CALCIUM 9.4 11/10/2022    Bone No results found for: "VD25OH", "VD125OH2TOT", "ZO1096EA5", "WU9811BJ4", "25OHVITD1", "25OHVITD2", "25OHVITD3", "TESTOFREE", "TESTOSTERONE"  Inflammation (CRP: Acute Phase) (ESR: Chronic Phase) No results found for: "CRP", "ESRSEDRATE", "LATICACIDVEN"       Note: Above Lab results  reviewed.  Imaging  MM 3D SCREENING MAMMOGRAM BILATERAL BREAST CLINICAL DATA:  Screening.  EXAM: DIGITAL SCREENING BILATERAL MAMMOGRAM WITH TOMOSYNTHESIS AND CAD  TECHNIQUE: Bilateral screening digital craniocaudal and mediolateral oblique mammograms were obtained. Bilateral screening digital breast tomosynthesis was performed. The images were evaluated with computer-aided detection.  COMPARISON:  Previous exam(s).  ACR Breast Density Category c: The breasts are heterogeneously dense, which may obscure small masses.  FINDINGS: There are no findings suspicious for malignancy.  IMPRESSION: No mammographic evidence of malignancy. A result letter of this screening mammogram will be mailed directly to the patient.  RECOMMENDATION: Screening mammogram in one year. (Code:SM-B-01Y)  BI-RADS CATEGORY  1: Negative.  Electronically Signed   By: Edwin Cap M.D.   On: 11/11/2022 12:07  Assessment  The primary encounter diagnosis was Primary osteoarthritis of right elbow. Diagnoses of Left hip pain and Primary osteoarthritis of left hip were also pertinent to this visit.  Plan of Care  Patient doing well after her right elbow steroid injection and states that her elbow is approximately 95% better from a pain standpoint.  She is endorsing return of left hip pain that is worse with weightbearing related to hip osteoarthritis she is status post left intra-articular hip steroid injection under fluoroscopy on 09/09/2022 that provided her with approximately 70% pain relief for 5 to 6 weeks with gradual return of pain thereafter.  We discussed repeating injection, I will add more volume this time around the hip joint to see if that translates to a longer duration of pain relief.  Risk and benefits reviewed and patient would like to proceed.  Orders:  Orders Placed This Encounter  Procedures   HIP INJECTION    Standing Status:   Future    Standing Expiration Date:   02/23/2023     Scheduling Instructions:     Side: LEFT     Sedation: without     Timeframe: As soon as schedule allows   Follow-up plan:   Return in about 23 days (around 12/16/2022).      Status post L4-L5 ESI #1 on 10/16/2019, repeat.  Left L4 Sprint peripheral nerve stimulation 02/19/2020; removed 05/08/2020., right L4 Sprint peripheral nerve stimulation 03/13/2020, lead fracture, replaced 04/15/2020, right removed 06/20/2020.  Right axillary peripheral nerve stimulation 07/29/2020.          Recent Visits Date Type Provider Dept  10/12/22 Procedure visit Edward Jolly, MD [facility]  09/09/22 Procedure visit Edward Jolly, MD [facility]  Showing recent visits within past 90 days and meeting all other requirements Today's Visits Date Type Provider Dept  11/23/22 Office Visit Edward Jolly, MD [facility]  Showing today's visits and meeting all other requirements Future Appointments Date Type Provider Dept  02/11/23 Appointment Edward Jolly, MD [facility]  Showing future appointments within next 90 days and meeting all other requirements  I discussed the assessment and  treatment plan with the patient. The patient was provided an opportunity to ask questions and all were answered. The patient agreed with the plan and demonstrated an understanding of the instructions.  Patient advised to call back or seek an in-person evaluation if the symptoms or condition worsens.  Duration of encounter: .  Note by: Edward Jolly, MD Date: 11/23/2022; Time: 3:20 PM

## 2022-11-23 NOTE — Patient Instructions (Signed)

## 2022-12-10 ENCOUNTER — Ambulatory Visit: Payer: Medicare PPO

## 2022-12-10 DIAGNOSIS — I10 Essential (primary) hypertension: Secondary | ICD-10-CM

## 2022-12-11 DIAGNOSIS — G7111 Myotonic muscular dystrophy: Secondary | ICD-10-CM | POA: Diagnosis not present

## 2022-12-11 DIAGNOSIS — I1A0 Resistant hypertension: Secondary | ICD-10-CM | POA: Diagnosis not present

## 2022-12-11 DIAGNOSIS — G4733 Obstructive sleep apnea (adult) (pediatric): Secondary | ICD-10-CM | POA: Diagnosis not present

## 2022-12-11 DIAGNOSIS — I10 Essential (primary) hypertension: Secondary | ICD-10-CM | POA: Diagnosis not present

## 2022-12-11 DIAGNOSIS — E119 Type 2 diabetes mellitus without complications: Secondary | ICD-10-CM | POA: Diagnosis not present

## 2022-12-13 DIAGNOSIS — S43429A Sprain of unspecified rotator cuff capsule, initial encounter: Secondary | ICD-10-CM | POA: Diagnosis not present

## 2022-12-13 DIAGNOSIS — M5030 Other cervical disc degeneration, unspecified cervical region: Secondary | ICD-10-CM | POA: Diagnosis not present

## 2022-12-13 DIAGNOSIS — M47816 Spondylosis without myelopathy or radiculopathy, lumbar region: Secondary | ICD-10-CM | POA: Diagnosis not present

## 2022-12-15 ENCOUNTER — Encounter: Payer: Self-pay | Admitting: Internal Medicine

## 2022-12-15 ENCOUNTER — Ambulatory Visit (INDEPENDENT_AMBULATORY_CARE_PROVIDER_SITE_OTHER): Payer: Medicare PPO | Admitting: Internal Medicine

## 2022-12-15 VITALS — BP 118/88 | HR 94 | Ht 68.0 in | Wt 239.0 lb

## 2022-12-15 DIAGNOSIS — I10 Essential (primary) hypertension: Secondary | ICD-10-CM | POA: Diagnosis not present

## 2022-12-15 DIAGNOSIS — E119 Type 2 diabetes mellitus without complications: Secondary | ICD-10-CM | POA: Diagnosis not present

## 2022-12-15 LAB — POC CREATINE & ALBUMIN,URINE
Creatinine, POC: 300 mg/dL
Microalbumin Ur, POC: 150 mg/L

## 2022-12-15 NOTE — Progress Notes (Signed)
Established Patient Office Visit  Subjective:  Patient ID: Vickie Stevens, female    DOB: 27-Aug-1970  Age: 52 y.o. MRN: [id number]  Chief Complaint  Patient presents with   Follow-up    6 week follow up    BP was elevated at her last few appointments so Cardiology added Carvedilol. 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    No other concerns at this time.   Past Medical History:  Diagnosis Date   ADHD (attention deficit hyperactivity disorder)    Biceps tendonitis    Bipolar disorder (HCC)    Chronic back pain    Cluster headache    Crohn'Jacyln Carmer disease (HCC)    DDD (degenerative disc disease), lumbar    Depression    Diabetes mellitus without complication (HCC)    Hypercholesteremia    Hypertension    PCOS (polycystic ovarian syndrome)    Rotator cuff tendinitis    Sleep apnea    Tuberous sclerosis (HCC)     Past Surgical History:  Procedure Laterality Date   CATARACT EXTRACTION, BILATERAL     COLONOSCOPY WITH PROPOFOL N/A 06/08/2018   Procedure: COLONOSCOPY WITH PROPOFOL;  Surgeon: Christena Deem, MD;  Location: [facility] ENDOSCOPY;  Service: Endoscopy;  Laterality: N/A;   ENDOMETRIAL ABLATION  01-2011   ESOPHAGOGASTRODUODENOSCOPY (EGD) WITH PROPOFOL N/A 06/08/2018   Procedure: ESOPHAGOGASTRODUODENOSCOPY (EGD) WITH PROPOFOL;  Surgeon: Christena Deem, MD;  Location: [facility] ENDOSCOPY;  Service: Endoscopy;  Laterality: N/A;   SCS 8/ 2021     SHOULDER SURGERY Left 07-1999, 07-2011   x 4    Social History   Socioeconomic History   Marital status: Divorced    Spouse name: Not on file   Number of children: Not on file   Years of education: Not on file   Highest education level: Not on file  Occupational History   Not on file  Tobacco Use   Smoking status: Never   Smokeless tobacco: Never  Vaping Use   Vaping Use: Never used  Substance and Sexual Activity   Alcohol use: No   Drug use: Never   Sexual activity: Not on file  Other Topics Concern   Not  on file  Social History Narrative   Not on file   Social Determinants of Health   Financial Resource Strain: Not on file  Food Insecurity: Not on file  Transportation Needs: Not on file  Physical Activity: Not on file  Stress: Not on file  Social Connections: Not on file  Intimate Partner Violence: Not on file    Family History  Problem Relation Age of Onset   Diabetes Father    Hyperlipidemia Father    Hypertension Father    Breast cancer Maternal Aunt     Allergies  Allergen Reactions   Phenergan [Promethazine Hcl] Other (See Comments)    Altered mental status; patient remarked that she ripped out her IV and tore up the hospital room   Codeine Hives   Cortisone Other (See Comments)    Steroid injections may have contributed to cataracts   Lidocaine Hives    With oral, only   Oxycodone Diarrhea and Rash    Pruritic rash, also    Review of Systems  Constitutional: Negative.        Gained weight  HENT: Negative.    Eyes: Negative.   Respiratory: Negative.    Cardiovascular: Negative.   Gastrointestinal: Negative.   Genitourinary: Negative.   Skin: Negative.   Neurological: Negative.  Endo/Heme/Allergies: Negative.        Objective:   BP 118/88   Pulse 94   Ht 5\' 8"  (1.727 m)   Wt 239 lb (108.4 kg)   SpO2 98%   BMI 36.34 kg/m   Vitals:   12/15/22 1117  BP: 118/88  Pulse: 94  Height: 5\' 8"  (1.727 m)  Weight: 239 lb (108.4 kg)  SpO2: 98%  BMI (Calculated): 36.35    Physical Exam Vitals reviewed.  Constitutional:      General: She is not in acute distress.    Appearance: She is obese.  HENT:     Head: Normocephalic.     Nose: Nose normal.     Mouth/Throat:     Mouth: Mucous membranes are moist.  Eyes:     Extraocular Movements: Extraocular movements intact.     Pupils: Pupils are equal, round, and reactive to light.  Cardiovascular:     Rate and Rhythm: Normal rate and regular rhythm.     Heart sounds: No murmur heard. Pulmonary:      Effort: Pulmonary effort is normal.     Breath sounds: No rhonchi or rales.  Abdominal:     General: Abdomen is flat.     Palpations: There is no hepatomegaly, splenomegaly or mass.  Musculoskeletal:        General: Normal range of motion.     Cervical back: Normal range of motion. No tenderness.  Skin:    General: Skin is warm and dry.  Neurological:     General: No focal deficit present.     Mental Status: She is alert and oriented to person, place, and time.     Cranial Nerves: No cranial nerve deficit.     Motor: No weakness.  Psychiatric:        Mood and Affect: Mood normal.        Behavior: Behavior normal.      Results for orders placed or performed in visit on 12/15/22  POC CREATINE & ALBUMIN,URINE  Result Value Ref Range   Microalbumin Ur, POC 150 mg/L   Creatinine, POC 300 mg/dL   Albumin/Creatinine Ratio, Urine, POC 30-300     Recent Results (from the past 2160 hour(Kahmya Pinkham))  Vitamin C     Status: None   Collection Time: 10/15/22  2:23 PM  Result Value Ref Range   Vitamin C 0.6 0.4 - 2.0 mg/dL    Comment: Vitamin C deficiency is generally defined as plasma or serum concentrations less than 0.2 mg/dL and levels between 0.2 and 0.4 mg/dL are considered low.   Comprehensive metabolic panel     Status: Abnormal   Collection Time: 11/10/22 12:02 PM  Result Value Ref Range   Glucose 125 (H) 70 - 99 mg/dL   BUN 12 6 - 24 mg/dL   Creatinine, Ser 1.61 0.57 - 1.00 mg/dL   eGFR 096 >04 VW/UJW/1.19   BUN/Creatinine Ratio 20 9 - 23   Sodium 144 134 - 144 mmol/L   Potassium 4.7 3.5 - 5.2 mmol/L   Chloride 107 (H) 96 - 106 mmol/L   CO2 20 20 - 29 mmol/L   Calcium 9.4 8.7 - 10.2 mg/dL   Total Protein 6.0 6.0 - 8.5 g/dL   Albumin 4.0 3.9 - 4.9 g/dL   Globulin, Total 2.0 1.5 - 4.5 g/dL   Albumin/Globulin Ratio 2.0 1.2 - 2.2   Bilirubin Total 0.4 0.0 - 1.2 mg/dL   Alkaline Phosphatase 130 (H) 44 - 121 IU/L  AST 20 0 - 40 IU/L   ALT 16 0 - 32 IU/L  Lipid panel      Status: None   Collection Time: 11/10/22 12:02 PM  Result Value Ref Range   Cholesterol, Total 161 100 - 199 mg/dL   Triglycerides 75 0 - 149 mg/dL   HDL 66 >16 mg/dL   VLDL Cholesterol Cal 14 5 - 40 mg/dL   LDL Chol Calc (NIH) 81 0 - 99 mg/dL   Chol/HDL Ratio 2.4 0.0 - 4.4 ratio    Comment:                                   T. Chol/HDL Ratio                                             Men  Women                               1/2 Avg.Risk  3.4    3.3                                   Avg.Risk  5.0    4.4                                2X Avg.Risk  9.6    7.1                                3X Avg.Risk 23.4   11.0   Hemoglobin A1c     Status: Abnormal   Collection Time: 11/10/22 12:02 PM  Result Value Ref Range   Hgb A1c MFr Bld 6.6 (H) 4.8 - 5.6 %    Comment:          Prediabetes: 5.7 - 6.4          Diabetes: >6.4          Glycemic control for adults with diabetes: <7.0    Est. average glucose Bld gHb Est-mCnc 143 mg/dL  POC CREATINE & ALBUMIN,URINE     Status: Abnormal   Collection Time: 12/15/22 11:43 AM  Result Value Ref Range   Microalbumin Ur, POC 150 mg/L   Creatinine, POC 300 mg/dL   Albumin/Creatinine Ratio, Urine, POC 30-300       Assessment & Plan:   Problem List Items Addressed This Visit       Cardiovascular and Mediastinum   Essential hypertension - Primary   Relevant Medications   carvedilol (COREG) 6.25 MG tablet     Endocrine   Type 2 diabetes mellitus (HCC)   Relevant Orders   POC CREATINE & ALBUMIN,URINE (Completed)    Return in about 2 months (around 02/14/2023) for fu with labs prior.   Total time spent: 20 minutes  Luna Fuse, MD  12/15/2022   This document may have been prepared by [facility] Voice Recognition software and as such may include unintentional dictation errors.

## 2022-12-16 ENCOUNTER — Ambulatory Visit
Admission: RE | Admit: 2022-12-16 | Discharge: 2022-12-16 | Disposition: A | Discharge status: Home / Self Care | Payer: Medicare PPO | Source: Ambulatory Visit | Attending: Student in an Organized Health Care Education/Training Program | Admitting: Student in an Organized Health Care Education/Training Program

## 2022-12-16 ENCOUNTER — Ambulatory Visit
Payer: Medicare PPO | Attending: Student in an Organized Health Care Education/Training Program | Admitting: Student in an Organized Health Care Education/Training Program

## 2022-12-16 ENCOUNTER — Encounter: Payer: Self-pay | Admitting: Cardiovascular Disease

## 2022-12-16 ENCOUNTER — Encounter: Payer: Self-pay | Admitting: Student in an Organized Health Care Education/Training Program

## 2022-12-16 VITALS — BP 130/75 | HR 77 | Temp 97.3°F | Resp 17 | Ht 68.0 in | Wt 230.0 lb

## 2022-12-16 DIAGNOSIS — M25552 Pain in left hip: Secondary | ICD-10-CM | POA: Diagnosis not present

## 2022-12-16 DIAGNOSIS — R1312 Dysphagia, oropharyngeal phase: Secondary | ICD-10-CM | POA: Diagnosis not present

## 2022-12-16 DIAGNOSIS — G7111 Myotonic muscular dystrophy: Secondary | ICD-10-CM | POA: Diagnosis not present

## 2022-12-16 DIAGNOSIS — M1612 Unilateral primary osteoarthritis, left hip: Secondary | ICD-10-CM | POA: Insufficient documentation

## 2022-12-16 MED ORDER — METHYLPREDNISOLONE ACETATE 40 MG/ML IJ SUSP
40.0000 mg | Freq: Once | INTRAMUSCULAR | Status: AC
  Administered 2022-12-16: 40 mg via INTRA_ARTICULAR
  Filled 2022-12-16: qty 1

## 2022-12-16 MED ORDER — ROPIVACAINE HCL 2 MG/ML IJ SOLN
4.0000 mL | Freq: Once | INTRAMUSCULAR | Status: AC
  Administered 2022-12-16: 4 mL via PERINEURAL
  Filled 2022-12-16: qty 20

## 2022-12-16 MED ORDER — IOHEXOL 180 MG/ML  SOLN
10.0000 mL | Freq: Once | INTRAMUSCULAR | Status: AC
  Administered 2022-12-16: 10 mL via INTRA_ARTICULAR
  Filled 2022-12-16: qty 20

## 2022-12-16 MED ORDER — LIDOCAINE HCL 2 % IJ SOLN
20.0000 mL | Freq: Once | INTRAMUSCULAR | Status: AC
  Administered 2022-12-16: 100 mg
  Filled 2022-12-16: qty 40

## 2022-12-16 NOTE — Progress Notes (Signed)
Safety precautions to be maintained throughout the outpatient stay will include: orient to surroundings, keep bed in low position, maintain call bell within reach at all times, provide assistance with transfer out of bed and ambulation.  

## 2022-12-16 NOTE — Patient Instructions (Signed)

## 2022-12-16 NOTE — Progress Notes (Signed)
PROVIDER NOTE: Interpretation of information contained herein should be left to medically-trained personnel. Specific patient instructions are provided elsewhere under "Patient Instructions" section of medical record. This document was created in part using STT-dictation technology, any transcriptional errors that may result from this process are unintentional.  Patient: Vickie Stevens Type: Established DOB: 02/07/1971 MRN: [id number] PCP: Sherron Monday, MD  Service: Procedure DOS: 12/16/2022 Setting: Ambulatory Location: Ambulatory outpatient facility Delivery: Face-to-face Provider: Edward Jolly, MD Specialty: Interventional Pain Management Specialty designation: 09 Location: Outpatient facility Ref. Prov.: Sherron Monday, MD       Interventional Therapy   Procedure: Intra-articular hip injection  #2  Laterality: Left (-LT)  Approach: Percutaneous anterior approach. Level: Lower pelvic and hip joint level.  Imaging: Fluoroscopy-guided         Anesthesia: Local anesthesia (1-2% Lidocaine)  DOS: 12/16/2022  Performed by: Edward Jolly, MD  Purpose: Diagnostic/Therapeutic Indications: Hip pain severe enough to impact quality of life or function. Rationale (medical necessity): procedure needed and proper for the diagnosis and/or treatment of Vickie Stevens medical symptoms and needs. 1. Left hip pain   2. Primary osteoarthritis of left hip     NAS-11 Pain score:   Pre-procedure: 5 /10   Post-procedure: 3 /10      Target: Intra-articular hip joint Region: Hip joint, upper (proximal) femoral region Type of procedure: Percutaneous joint injection    Pre-op H&P Assessment:  Vickie Stevens is a 52 y.o. (year old), female patient, seen today for interventional treatment. She  has a past surgical history that includes Endometrial ablation (01-2011); Shoulder surgery (Left, 07-1999, 07-2011); Cataract extraction, bilateral; Esophagogastroduodenoscopy (egd) with propofol (N/A,  06/08/2018); Colonoscopy with propofol (N/A, 06/08/2018); and SCS 8/ 2021. Vickie Stevens has a current medication list which includes the following prescription(s): accu-chek fastclix lancets, accu-chek guide, amlodipine, atorvastatin, belbuca, vitamin d3, clobetasol ointment, fluticasone, accu-chek fastclix lancet, lubiprostone, mexiletine, olmesartan, quetiapine, vitamin d (ergocalciferol), carvedilol, lactulose, linaclotide, and valacyclovir. Her primarily concern today is the Hip Pain (left)  Initial Vital Signs:  Pulse/HCG Rate: 77ECG Heart Rate: 71 Temp: (!) 97.3 F (36.3 C) Resp: 17 BP: (!) 146/87 SpO2: 100 %  BMI: Estimated body mass index is 34.97 kg/m as calculated from the following:   Height as of this encounter: 5\' 8"  (1.727 m).   Weight as of this encounter: 230 lb (104.3 kg).  Risk Assessment: Allergies: Reviewed. She is allergic to phenergan [promethazine hcl], codeine, cortisone, lidocaine, and oxycodone.  Allergy Precautions: None required Coagulopathies: Reviewed. None identified.  Blood-thinner therapy: None at this time Active Infection(s): Reviewed. None identified. Vickie Stevens is afebrile  Site Confirmation: Vickie Stevens was asked to confirm the procedure and laterality before marking the site Procedure checklist: Completed Consent: Before the procedure and under the influence of no sedative(s), amnesic(s), or anxiolytics, the patient was informed of the treatment options, risks and possible complications. To fulfill our ethical and legal obligations, as recommended by the American Medical Association's Code of Ethics, I have informed the patient of my clinical impression; the nature and purpose of the treatment or procedure; the risks, benefits, and possible complications of the intervention; the alternatives, including doing nothing; the risk(s) and benefit(s) of the alternative treatment(s) or procedure(s); and the risk(s) and benefit(s) of doing nothing. The patient was  provided information about the general risks and possible complications associated with the procedure. These may include, but are not limited to: failure to achieve desired goals, infection, bleeding, organ or nerve damage, allergic reactions, paralysis, and  death. In addition, the patient was informed of those risks and complications associated to the procedure, such as failure to decrease pain; infection; bleeding; organ or nerve damage with subsequent damage to sensory, motor, and/or autonomic systems, resulting in permanent pain, numbness, and/or weakness of one or several areas of the body; allergic reactions; (i.e.: anaphylactic reaction); and/or death. Furthermore, the patient was informed of those risks and complications associated with the medications. These include, but are not limited to: allergic reactions (i.e.: anaphylactic or anaphylactoid reaction(s)); adrenal axis suppression; blood sugar elevation that in diabetics may result in ketoacidosis or comma; water retention that in patients with history of congestive heart failure may result in shortness of breath, pulmonary edema, and decompensation with resultant heart failure; weight gain; swelling or edema; medication-induced neural toxicity; particulate matter embolism and blood vessel occlusion with resultant organ, and/or nervous system infarction; and/or aseptic necrosis of one or more joints. Finally, the patient was informed that Medicine is not an exact science; therefore, there is also the possibility of unforeseen or unpredictable risks and/or possible complications that may result in a catastrophic outcome. The patient indicated having understood very clearly. We have given the patient no guarantees and we have made no promises. Enough time was given to the patient to ask questions, all of which were answered to the patient's satisfaction. Vickie Stevens has indicated that she wanted to continue with the procedure. Attestation: I, the  ordering provider, attest that I have discussed with the patient the benefits, risks, side-effects, alternatives, likelihood of achieving goals, and potential problems during recovery for the procedure that I have provided informed consent. Date  Time: 12/16/2022 11:08 AM  Pre-Procedure Preparation:  Monitoring: As per clinic protocol. Respiration, ETCO2, SpO2, BP, heart rate and rhythm monitor placed and checked for adequate function Safety Precautions: Patient was assessed for positional comfort and pressure points before starting the procedure. Time-out: I initiated and conducted the "Time-out" before starting the procedure, as per protocol. The patient was asked to participate by confirming the accuracy of the "Time Out" information. Verification of the correct person, site, and procedure were performed and confirmed by me, the nursing staff, and the patient. "Time-out" conducted as per Joint Commission's Universal Protocol (UP.01.01.01). Time: 1145  Description/Narrative of Procedure:          Rationale (medical necessity): procedure needed and proper for the diagnosis and/or treatment of the patient's medical symptoms and needs. Procedural Technique Safety Precautions: Aspiration looking for blood return was conducted prior to all injections. At no point did we inject any substances, as a needle was being advanced. No attempts were made at seeking any paresthesias. Safe injection practices and needle disposal techniques used. Medications properly checked for expiration dates. SDV (single dose vial) medications used. Description of the Procedure: Protocol guidelines were followed. The patient was assisted into a comfortable position. The target area was identified and the area prepped in the usual manner. Skin & deeper tissues infiltrated with local anesthetic. Appropriate amount of time allowed to pass for local anesthetics to take effect. The procedure needles were then advanced to the target  area. Proper needle placement secured. Negative aspiration confirmed. Solution injected in intermittent fashion, asking for systemic symptoms every 0.5cc of injectate. The needles were then removed and the area cleansed, making sure to leave some of the prepping solution back to take advantage of its long term bactericidal properties.  Technical description of procedure:  Skin & deeper tissues infiltrated with local anesthetic. Appropriate amount of time allowed  to pass for local anesthetics to take effect. The procedure needles were then advanced to the target area. Proper needle placement secured. Negative aspiration confirmed. Solution injected in intermittent fashion, asking for systemic symptoms every 0.5cc of injectate. The needles were then removed and the area cleansed, making sure to leave some of the prepping solution back to take advantage of its long term bactericidal properties.  5 cc solution made of 4 cc of 0.2% ropivacaine, 1 cc of methylprednisolone, 40 mg/cc.  Injected into the left hip joint after contrast confirmation under fluoroscopy.       Vitals:   12/16/22 1117 12/16/22 1143 12/16/22 1148  BP: (!) 146/87 (!) 152/86 130/75  Pulse: 77    Resp: 17 15 17   Temp: (!) 97.3 F (36.3 C)    TempSrc: Temporal    SpO2: 100% 100% 100%  Weight: 230 lb (104.3 kg)    Height: 5\' 8"  (1.727 m)       Start Time: 1145 hrs. End Time: 1149 hrs.  Imaging Guidance (Non-Spinal):          Type of Imaging Technique: Fluoroscopy Guidance (Non-Spinal) Indication(s): Assistance in needle guidance and placement for procedures requiring needle placement in or near specific anatomical locations not easily accessible without such assistance. Exposure Time: Please see nurses notes. Contrast: Before injecting any contrast, we confirmed that the patient did not have an allergy to iodine, shellfish, or radiological contrast. Once satisfactory needle placement was completed at the desired level,  radiological contrast was injected. Contrast injected under live fluoroscopy. No contrast complications. See chart for type and volume of contrast used. Fluoroscopic Guidance: I was personally present during the use of fluoroscopy. "Tunnel Vision Technique" used to obtain the best possible view of the target area. Parallax error corrected before commencing the procedure. "Direction-depth-direction" technique used to introduce the needle under continuous pulsed fluoroscopy. Once target was reached, antero-posterior, oblique, and lateral fluoroscopic projection used confirm needle placement in all planes. Images permanently stored in EMR. Interpretation: I personally interpreted the imaging intraoperatively. Adequate needle placement confirmed in multiple planes. Appropriate spread of contrast into desired area was observed. No evidence of afferent or efferent intravascular uptake. Permanent images saved into the patient's record.  Post-operative Assessment:  Post-procedure Vital Signs:  Pulse/HCG Rate: 7784 Temp: (!) 97.3 F (36.3 C) Resp: 17 BP: 130/75 SpO2: 100 %  EBL: None  Complications: No immediate post-treatment complications observed by team, or reported by patient.  Note: The patient tolerated the entire procedure well. A repeat set of vitals were taken after the procedure and the patient was kept under observation following institutional policy, for this type of procedure. Post-procedural neurological assessment was performed, showing return to baseline, prior to discharge. The patient was provided with post-procedure discharge instructions, including a section on how to identify potential problems. Should any problems arise concerning this procedure, the patient was given instructions to immediately contact us, at any time, without hesitation. In any case, we plan to contact the patient by telephone for a follow-up status report regarding this interventional procedure.  Comments:  No  additional relevant information.  Plan of Care (POC)  Orders:  Orders Placed This Encounter  Procedures   DG PAIN CLINIC C-ARM 1-60 MIN NO REPORT    Intraoperative interpretation by procedural physician at [facility].    Standing Status:   Standing    Number of Occurrences:   1    Order Specific Question:   Reason for exam:    Answer:  Assistance in needle guidance and placement for procedures requiring needle placement in or near specific anatomical locations not easily accessible without such assistance.   Chronic Opioid Analgesic:  Belbuca 150 mcg twice daily    Medications ordered for procedure: Meds ordered this encounter  Medications   iohexol (OMNIPAQUE) 180 MG/ML injection 10 mL    Must be Myelogram-compatible. If not available, you may substitute with a water-soluble, non-ionic, hypoallergenic, myelogram-compatible radiological contrast medium.   lidocaine (XYLOCAINE) 2 % (with pres) injection 400 mg   methylPREDNISolone acetate (DEPO-MEDROL) injection 40 mg   ropivacaine (PF) 2 mg/mL (0.2%) (NAROPIN) injection 4 mL   Medications administered: We administered iohexol, lidocaine, methylPREDNISolone acetate, and ropivacaine (PF) 2 mg/mL (0.2%).  See the medical record for exact dosing, route, and time of administration.  Follow-up plan:   Return for Keep sch. appt.       Recent Visits Date Type Provider Dept  11/23/22 Office Visit Edward Jolly, MD [facility]  10/12/22 Procedure visit Edward Jolly, MD [facility]  Showing recent visits within past 90 days and meeting all other requirements Today's Visits Date Type Provider Dept  12/16/22 Procedure visit Edward Jolly, MD [facility]  Showing today's visits and meeting all other requirements Future Appointments Date Type Provider Dept  02/11/23 Appointment Edward Jolly, MD [facility]  Showing future appointments within next 90 days and meeting all other  requirements  Disposition: Discharge home  Discharge (Date  Time): 12/16/2022; 1200 hrs.   Primary Care Physician: Sherron Monday, MD Location: [facility] Note by: Edward Jolly, MD (TTS technology used. I apologize for any typographical errors that were not detected and corrected.) Date: 12/16/2022; Time: 11:55 AM  Disclaimer:  Medicine is not an Visual merchandiser. The only guarantee in medicine is that nothing is guaranteed. It is important to note that the decision to proceed with this intervention was based on the information collected from the patient. The Data and conclusions were drawn from the patient's questionnaire, the interview, and the physical examination. Because the information was provided in large part by the patient, it cannot be guaranteed that it has not been purposely or unconsciously manipulated. Every effort has been made to obtain as much relevant data as possible for this evaluation. It is important to note that the conclusions that lead to this procedure are derived in large part from the available data. Always take into account that the treatment will also be dependent on availability of resources and existing treatment guidelines, considered by other Pain Management Practitioners as being common knowledge and practice, at the time of the intervention. For Medico-Legal purposes, it is also important to point out that variation in procedural techniques and pharmacological choices are the acceptable norm. The indications, contraindications, technique, and results of the above procedure should only be interpreted and judged by a Board-Certified Interventional Pain Specialist with extensive familiarity and expertise in the same exact procedure and technique.

## 2022-12-17 ENCOUNTER — Telehealth: Payer: Self-pay | Admitting: *Deleted

## 2022-12-17 NOTE — Telephone Encounter (Signed)
Post procedure call;  patient reports that she is doing well, just a little sore today.  Ice/heat recommended.

## 2022-12-18 ENCOUNTER — Ambulatory Visit: Payer: Medicare PPO | Admitting: Internal Medicine

## 2022-12-25 ENCOUNTER — Other Ambulatory Visit: Payer: Self-pay | Admitting: Nurse Practitioner

## 2022-12-25 ENCOUNTER — Other Ambulatory Visit: Payer: Self-pay | Admitting: Internal Medicine

## 2022-12-30 DIAGNOSIS — E278 Other specified disorders of adrenal gland: Secondary | ICD-10-CM | POA: Diagnosis not present

## 2022-12-30 DIAGNOSIS — E559 Vitamin D deficiency, unspecified: Secondary | ICD-10-CM | POA: Diagnosis not present

## 2022-12-30 DIAGNOSIS — R7303 Prediabetes: Secondary | ICD-10-CM | POA: Diagnosis not present

## 2022-12-30 DIAGNOSIS — E669 Obesity, unspecified: Secondary | ICD-10-CM | POA: Diagnosis not present

## 2023-01-01 ENCOUNTER — Encounter: Payer: Self-pay | Admitting: Internal Medicine

## 2023-01-09 ENCOUNTER — Encounter: Payer: Self-pay | Admitting: Internal Medicine

## 2023-01-11 ENCOUNTER — Other Ambulatory Visit: Payer: Self-pay

## 2023-01-11 MED ORDER — VALACYCLOVIR HCL 500 MG PO TABS: 500.0000 mg | ORAL_TABLET | Freq: Every day | ORAL | 0 refills | Status: DC

## 2023-01-12 DIAGNOSIS — E7800 Pure hypercholesterolemia, unspecified: Secondary | ICD-10-CM | POA: Diagnosis not present

## 2023-01-12 DIAGNOSIS — K5901 Slow transit constipation: Secondary | ICD-10-CM | POA: Diagnosis not present

## 2023-01-12 DIAGNOSIS — E119 Type 2 diabetes mellitus without complications: Secondary | ICD-10-CM | POA: Diagnosis not present

## 2023-01-12 DIAGNOSIS — M5030 Other cervical disc degeneration, unspecified cervical region: Secondary | ICD-10-CM | POA: Diagnosis not present

## 2023-01-12 DIAGNOSIS — G7111 Myotonic muscular dystrophy: Secondary | ICD-10-CM | POA: Diagnosis not present

## 2023-01-12 DIAGNOSIS — M47816 Spondylosis without myelopathy or radiculopathy, lumbar region: Secondary | ICD-10-CM | POA: Diagnosis not present

## 2023-01-12 DIAGNOSIS — I10 Essential (primary) hypertension: Secondary | ICD-10-CM | POA: Diagnosis not present

## 2023-01-12 DIAGNOSIS — S43429A Sprain of unspecified rotator cuff capsule, initial encounter: Secondary | ICD-10-CM | POA: Diagnosis not present

## 2023-01-13 ENCOUNTER — Ambulatory Visit: Payer: Medicare PPO | Admitting: Dermatology

## 2023-01-14 ENCOUNTER — Other Ambulatory Visit: Payer: Self-pay

## 2023-01-15 ENCOUNTER — Other Ambulatory Visit: Payer: Self-pay

## 2023-01-15 DIAGNOSIS — E875 Hyperkalemia: Secondary | ICD-10-CM | POA: Diagnosis not present

## 2023-01-15 DIAGNOSIS — G7111 Myotonic muscular dystrophy: Secondary | ICD-10-CM | POA: Diagnosis not present

## 2023-01-15 DIAGNOSIS — E278 Other specified disorders of adrenal gland: Secondary | ICD-10-CM | POA: Diagnosis not present

## 2023-01-15 DIAGNOSIS — I10 Essential (primary) hypertension: Secondary | ICD-10-CM | POA: Diagnosis not present

## 2023-01-18 ENCOUNTER — Other Ambulatory Visit: Payer: Self-pay | Admitting: Internal Medicine

## 2023-01-18 MED ORDER — VITAMIN D (ERGOCALCIFEROL) 1.25 MG (50000 UNIT) PO CAPS: 50000.0000 [IU] | ORAL_CAPSULE | ORAL | 1 refills | Status: DC

## 2023-01-18 NOTE — Progress Notes (Signed)
This was auto genertated and will talk to patient about continuing to take this

## 2023-01-18 NOTE — Telephone Encounter (Signed)
[facility] left VM requesting new Rx for vit D2. Please advise.

## 2023-01-19 ENCOUNTER — Ambulatory Visit: Payer: Medicare PPO | Admitting: Internal Medicine

## 2023-01-20 ENCOUNTER — Encounter: Payer: Self-pay | Admitting: Internal Medicine

## 2023-01-22 ENCOUNTER — Encounter: Payer: Self-pay | Admitting: Cardiovascular Disease

## 2023-01-30 ENCOUNTER — Other Ambulatory Visit: Payer: Self-pay | Admitting: Internal Medicine

## 2023-02-08 DIAGNOSIS — E119 Type 2 diabetes mellitus without complications: Secondary | ICD-10-CM | POA: Diagnosis not present

## 2023-02-11 ENCOUNTER — Encounter: Payer: Self-pay | Admitting: Student in an Organized Health Care Education/Training Program

## 2023-02-11 ENCOUNTER — Ambulatory Visit
Payer: Medicare PPO | Attending: Student in an Organized Health Care Education/Training Program | Admitting: Student in an Organized Health Care Education/Training Program

## 2023-02-11 VITALS — BP 129/99 | HR 82 | Temp 97.1°F | Resp 16 | Ht 68.0 in | Wt 235.0 lb

## 2023-02-11 DIAGNOSIS — M25552 Pain in left hip: Secondary | ICD-10-CM | POA: Diagnosis not present

## 2023-02-11 DIAGNOSIS — M1612 Unilateral primary osteoarthritis, left hip: Secondary | ICD-10-CM | POA: Diagnosis not present

## 2023-02-11 DIAGNOSIS — M48062 Spinal stenosis, lumbar region with neurogenic claudication: Secondary | ICD-10-CM | POA: Diagnosis not present

## 2023-02-11 DIAGNOSIS — M7062 Trochanteric bursitis, left hip: Secondary | ICD-10-CM | POA: Diagnosis not present

## 2023-02-11 DIAGNOSIS — M542 Cervicalgia: Secondary | ICD-10-CM | POA: Diagnosis not present

## 2023-02-11 DIAGNOSIS — E7402 Pompe disease: Secondary | ICD-10-CM | POA: Diagnosis not present

## 2023-02-11 DIAGNOSIS — M5416 Radiculopathy, lumbar region: Secondary | ICD-10-CM | POA: Insufficient documentation

## 2023-02-11 DIAGNOSIS — G894 Chronic pain syndrome: Secondary | ICD-10-CM | POA: Insufficient documentation

## 2023-02-11 DIAGNOSIS — G7111 Myotonic muscular dystrophy: Secondary | ICD-10-CM | POA: Insufficient documentation

## 2023-02-11 MED ORDER — BELBUCA 150 MCG BU FILM
1.0000 | ORAL_FILM | Freq: Two times a day (BID) | BUCCAL | 5 refills | Status: DC
Start: 2023-02-11 — End: 2023-08-24

## 2023-02-11 NOTE — Progress Notes (Signed)
Nursing Pain Medication Assessment:  Safety precautions to be maintained throughout the outpatient stay will include: orient to surroundings, keep bed in low position, maintain call bell within reach at all times, provide assistance with transfer out of bed and ambulation.  Medication Inspection Compliance: Pill count conducted under aseptic conditions, in front of the patient. Neither the pills nor the bottle was removed from the patient's sight at any time. Once count was completed pills were immediately returned to the patient in their original bottle.  Medication: Buprenorphine (Suboxone) Pill/Patch Count:  2 of 60 patches remain Pill/Patch Appearance: Markings consistent with prescribed medication Bottle Appearance: Standard pharmacy container. Clearly labeled. Filled Date: 7 / 3 / 2024 Last Medication intake:  Today

## 2023-02-11 NOTE — Progress Notes (Signed)
PROVIDER NOTE: Information contained herein reflects review and annotations entered in association with encounter. Interpretation of such information and data should be left to medically-trained personnel. Information provided to patient can be located elsewhere in the medical record under "Patient Instructions". Document created using STT-dictation technology, any transcriptional errors that may result from process are unintentional.    Patient: Vickie Stevens  Service Category: E/M  Provider: Edward Jolly, MD  DOB: 1970/11/20  DOS: 02/11/2023  Referring Provider: Sherron Monday, MD  MRN: [id number]  Specialty: Interventional Pain Management  PCP: Vickie Monday, MD  Type: Established Patient  Setting: Ambulatory outpatient    Location: Office  Delivery: Face-to-face     HPI  [name], a 52 y.o. year old female, is here today because of her Left hip pain [M25.552]. [name] primary complain today is Hip Pain (left) and Neck Pain (right)  Pertinent problems: [name] has DDD (degenerative disc disease), cervical; Rotator cuff (capsule) sprain Tear of rotator cuff; Lumbar and sacral arthritis; Bilateral occipital neuralgia; Myotonic muscular dystrophy (HCC); Myotonic dystrophy (HCC); Chronic pain syndrome; and Lumbar facet arthropathy on their pertinent problem list. Pain Assessment: Severity of Chronic pain is reported as a 7 /10. Location: Hip Right/to left buttock, "ache at L hip injection site"; "now feels like hip pain has migrated to lower back". Onset: More than a month ago. Quality: Aching, Throbbing. Timing: Constant. Modifying factor(s): procedure, meds, heat, TENS. Vitals:  height is 5\' 8"  (1.727 m) and weight is 235 lb (106.6 kg). Her temperature is 97.1 F (36.2 C) (abnormal). Her blood pressure is 129/99 (abnormal) and her pulse is 82. Her respiration is 16 and oxygen saturation is 99%.  BMI: Estimated body mass index is 35.73 kg/m as calculated from the  following:   Height as of this encounter: 5\' 8"  (1.727 m).   Weight as of this encounter: 235 lb (106.6 kg). Last encounter: 11/23/2022. Last procedure: 12/16/2022.  Reason for encounter: both, medication management and post-procedure evaluation and assessment.   Patient is complaining of left greater trochanteric bursa pain that is tender to palpation. She is also complaining of low back pain with radiation into bilateral hips and buttocks left greater than right. She is also complaining of right cervical trigger point  Post-procedure evaluation   Procedure: Intra-articular hip injection  #2  Laterality: Left (-LT)  Approach: Percutaneous anterior approach. Level: Lower pelvic and hip joint level.  Imaging: Fluoroscopy-guided         Anesthesia: Local anesthesia (1-2% Lidocaine)  DOS: 12/16/2022  Performed by: Vickie Jolly, MD  Purpose: Diagnostic/Therapeutic Indications: Hip pain severe enough to impact quality of life or function. Rationale (medical necessity): procedure needed and proper for the diagnosis and/or treatment of [name] medical symptoms and needs. 1. Left hip pain   2. Primary osteoarthritis of left hip     NAS-11 Pain score:   Pre-procedure: 5 /10   Post-procedure: 3 /10       Effectiveness:  Initial hour after procedure: 80 %  Subsequent 4-6 hours post-procedure: 80 %  Analgesia past initial 6 hours: 80 % (X 2 weeks, then back to pre procedure level)  Ongoing improvement:  Analgesic:  back to baseline Function: Back to baseline ROM: Back to baseline   Pharmacotherapy Assessment  Analgesic: Belbuca 150 mcg twice daily    Monitoring: [place] PMP: PDMP reviewed during this encounter.       Pharmacotherapy: No side-effects or adverse reactions reported. Compliance: No problems identified.  Effectiveness: Clinically acceptable.  Nonah Mattes, RN  02/11/2023 10:41 AM  Sign when Signing Visit Nursing Pain Medication Assessment:  Safety precautions to  be maintained throughout the outpatient stay will include: orient to surroundings, keep bed in low position, maintain call bell within reach at all times, provide assistance with transfer out of bed and ambulation.  Medication Inspection Compliance: Pill count conducted under aseptic conditions, in front of the patient. Neither the pills nor the bottle was removed from the patient's sight at any time. Once count was completed pills were immediately returned to the patient in their original bottle.  Medication: Buprenorphine (Suboxone) Pill/Patch Count:  2 of 60 patches remain Pill/Patch Appearance: Markings consistent with prescribed medication Bottle Appearance: Standard pharmacy container. Clearly labeled. Filled Date: 7 / 3 / 2024 Last Medication intake:  Today    No results found for: "CBDTHCR" No results found for: "D8THCCBX" No results found for: "D9THCCBX"  UDS:  Summary  Date Value Ref Range Status  03/05/2022 Note  Final    Comment:    ==================================================================== [employer] Select 13 (MW) ==================================================================== Test                             Result       Flag       Units  Drug Present and Declared for Prescription Verification   Buprenorphine                  4            EXPECTED   ng/mg creat   Norbuprenorphine               14           EXPECTED   ng/mg creat    Source of buprenorphine is a scheduled prescription medication.    Norbuprenorphine is an expected metabolite of buprenorphine.  ==================================================================== Test                      Result    Flag   Units      Ref Range   Creatinine              190              mg/dL      >=44 ==================================================================== Declared Medications:  The flagging and interpretation on this report are based on the  following declared medications.  Unexpected results  may arise from  inaccuracies in the declared medications.   **Note: The testing scope of this panel does not include small to  moderate amounts of these reported medications:   Buprenorphine (Belbuca)   **Note: The testing scope of this panel does not include the  following reported medications:   Amlodipine (Norvasc)  Atorvastatin (Lipitor) ==================================================================== For clinical consultation, please call [phone]. ====================================================================       ROS  Constitutional: Denies any fever or chills Gastrointestinal: No reported hemesis, hematochezia, vomiting, or acute GI distress Musculoskeletal:  Left greater trochanteric bursa pain, low back pain with radiation into bilateral hips and buttocks Neurological: No reported episodes of acute onset apraxia, aphasia, dysarthria, agnosia, amnesia, paralysis, loss of coordination, or loss of consciousness  Medication Review  Accu-Chek FastClix Lancet, Accu-Chek FastClix Lancets, Accu-Chek Guide, Buprenorphine HCl, QUEtiapine, Vitamin D (Ergocalciferol), Vitamin D3, amLODipine, atorvastatin, carvedilol, clobetasol ointment, fluticasone, glucose blood, lubiprostone, mexiletine, and valACYclovir  History Review  Allergy: Ms. Grenell is allergic to phenergan [  promethazine hcl], codeine, cortisone, lidocaine, and oxycodone. Drug: Ms. Raysor  reports no history of drug use. Alcohol:  reports no history of alcohol use. Tobacco:  reports that she has never smoked. She has never used smokeless tobacco. Social: Ms. Knippel  reports that she has never smoked. She has never used smokeless tobacco. She reports that she does not drink alcohol and does not use drugs. Medical:  has a past medical history of ADHD (attention deficit hyperactivity disorder), Biceps tendonitis, Bipolar disorder (HCC), Chronic back pain, Cluster headache, Crohn's disease (HCC), DDD  (degenerative disc disease), lumbar, Depression, Diabetes mellitus without complication (HCC), Hypercholesteremia, Hypertension, PCOS (polycystic ovarian syndrome), Rotator cuff tendinitis, Sleep apnea, and Tuberous sclerosis (HCC). Surgical: Ms. Weatherwax  has a past surgical history that includes Endometrial ablation (01-2011); Shoulder surgery (Left, 07-1999, 07-2011); Cataract extraction, bilateral; Esophagogastroduodenoscopy (egd) with propofol (N/A, 06/08/2018); Colonoscopy with propofol (N/A, 06/08/2018); and SCS 8/ 2021. Family: family history includes Breast cancer in her maternal aunt; Diabetes in her father; Hyperlipidemia in her father; Hypertension in her father.  Laboratory Chemistry Profile   Renal Lab Results  Component Value Date   BUN 12 11/10/2022   CREATININE 0.59 11/10/2022   BCR 20 11/10/2022   GFRAA >60 12/08/2017   GFRNONAA >60 12/08/2017    Hepatic Lab Results  Component Value Date   AST 20 11/10/2022   ALT 16 11/10/2022   ALBUMIN 4.0 11/10/2022   ALKPHOS 130 (H) 11/10/2022    Electrolytes Lab Results  Component Value Date   NA 144 11/10/2022   K 4.7 11/10/2022   CL 107 (H) 11/10/2022   CALCIUM 9.4 11/10/2022    Bone No results found for: "VD25OH", "VD125OH2TOT", "ZO1096EA5", "WU9811BJ4", "25OHVITD1", "25OHVITD2", "25OHVITD3", "TESTOFREE", "TESTOSTERONE"  Inflammation (CRP: Acute Phase) (ESR: Chronic Phase) No results found for: "CRP", "ESRSEDRATE", "LATICACIDVEN"       Note: Above Lab results reviewed.  Physical Exam  General appearance: Well nourished, well developed, and well hydrated. In no apparent acute distress Mental status: Alert, oriented x 3 (person, place, & time)       Respiratory: No evidence of acute respiratory distress Eyes: PERLA Vitals: BP (!) 129/99   Pulse 82   Temp (!) 97.1 F (36.2 C)   Resp 16   Ht 5\' 8"  (1.727 m)   Wt 235 lb (106.6 kg)   LMP  (Approximate)   SpO2 99%   BMI 35.73 kg/m  BMI: Estimated body mass index is  35.73 kg/m as calculated from the following:   Height as of this encounter: 5\' 8"  (1.727 m).   Weight as of this encounter: 235 lb (106.6 kg). Ideal: Ideal body weight: 63.9 kg (140 lb 14 oz) Adjusted ideal body weight: 81 kg (178 lb 8.4 oz)  Right cervical trigger point  Lumbar Spine Area Exam  Skin & Axial Inspection: No masses, redness, or swelling Alignment: Symmetrical Functional ROM: Unrestricted ROM       Stability: No instability detected Muscle Tone/Strength: Functionally intact. No obvious neuro-muscular anomalies detected. Sensory (Neurological): Musculoskeletal pain pattern possibly neurogenic Palpation: Complains of area being tender to palpation        Lower Extremity Exam    Side: Right lower extremity  Side: Left lower extremity  Stability: No instability observed          Stability: No instability observed          Skin & Extremity Inspection: Skin color, temperature, and hair growth are WNL. No peripheral edema or cyanosis. No masses, redness,  swelling, asymmetry, or associated skin lesions. No contractures.  Skin & Extremity Inspection: Skin color, temperature, and hair growth are WNL. No peripheral edema or cyanosis. No masses, redness, swelling, asymmetry, or associated skin lesions. No contractures.  Functional ROM: Unrestricted ROM                  Functional ROM: Pain restricted ROM for hip joint          Muscle Tone/Strength: Functionally intact. No obvious neuro-muscular anomalies detected.  Muscle Tone/Strength: Functionally intact. No obvious neuro-muscular anomalies detected.  Sensory (Neurological): Unimpaired        Sensory (Neurological): Musculoskeletal pain pattern tender to palpation overlying left greater trochanteric bursa.        DTR: Patellar: deferred today Achilles: deferred today Plantar: deferred today  DTR: Patellar: deferred today Achilles: deferred today Plantar: deferred today  Palpation: No palpable anomalies  Palpation: No palpable  anomalies    Assessment   Diagnosis Status  1. Left hip pain   2. Primary osteoarthritis of left hip   3. Greater trochanteric bursitis of left hip   4. Myotonic muscular dystrophy (HCC)   5. Chronic pain syndrome   6. Lumbar radiculopathy   7. Pompe disease (HCC)   8. Spinal stenosis, lumbar region, with neurogenic claudication   9. Cervicalgia    Persistent Persistent Having a Flare-up   Updated Problems: Problem  Greater Trochanteric Bursitis of Left Hip    Plan of Care   [name] has a current medication list which includes the following long-term medication(s): atorvastatin, carvedilol, fluticasone, mexiletine, and amlodipine.  Pharmacotherapy (Medications Ordered): Meds ordered this encounter  Medications   Buprenorphine HCl (BELBUCA) 150 MCG FILM    Sig: Place 1 Film inside cheek every 12 (twelve) hours.    Dispense:  60 each    Refill:  5    For chronic pain syndrome   Orders:  Orders Placed This Encounter  Procedures   HIP INJECTION    Standing Status:   Future    Standing Expiration Date:   05/14/2023    Scheduling Instructions:     Purpose: Therapeutic/Diagnostic     Indication: Hip pain 2ry to Trochanteric Burlitis unspecified side (M70.60).     Side: LEFT     Sedation: Patient's choice.     Timeframe: As soon as the schedule permits.   TRIGGER POINT INJECTION    Standing Status:   Future    Standing Expiration Date:   05/14/2023    Scheduling Instructions:     Right cervical TPI    Order Specific Question:   Where will this procedure be performed?    Answer:   [facility]   MR LUMBAR SPINE WO CONTRAST    Patient presents with axial pain with possible radicular component. Please assist [place] in identifying specific level(s) and laterality of any additional findings such as: 1. Facet (Zygapophyseal) joint DJD (Hypertrophy, space narrowing, subchondral sclerosis, and/or osteophyte formation) 2. DDD and/or IVDD (Loss of disc height,  desiccation, gas patterns, osteophytes, endplate sclerosis, or "Black disc disease") 3. Pars defects 4. Spondylolisthesis, spondylosis, and/or spondyloarthropathies (include Degree/Grade of displacement in mm) (stability) 5. Vertebral body Fractures (acute/chronic) (state percentage of collapse) 6. Demineralization (osteopenia/osteoporotic) 7. Bone pathology 8. Foraminal narrowing  9. Surgical changes 10. Central, Lateral Recess, and/or Foraminal Stenosis (include AP diameter of stenosis in mm) 11. Surgical changes (hardware type, status, and presence of fibrosis) 12. Modic Type Changes (MRI only) 13. IVDD (Disc bulge,  protrusion, herniation, extrusion) (Level, laterality, extent)    Standing Status:   Future    Standing Expiration Date:   03/14/2023    Scheduling Instructions:     Please make sure that the patient understands that this needs to be done as soon as possible. Never have the patient do the imaging "just before the next appointment". Inform patient that having the imaging done within the [facility] will expedite the availability of the results and will provide      imaging availability to the requesting physician. In addition inform the patient that the imaging order has an expiration date and will not be renewed if not done within the active period.    Order Specific Question:   What is the patient's sedation requirement?    Answer:   No Sedation    Order Specific Question:   Does the patient have a pacemaker or implanted devices?    Answer:   No    Order Specific Question:   Preferred imaging location?    Answer:   [facility] (table limit-350lbs)    Order Specific Question:   Call Results- Best Contact Number?    Answer:   [phone] [facility])    Order Specific Question:   Radiology Contrast Protocol - do NOT remove file path    Answer:   [email or web address]   Follow-up plan:   Return in about 11 days (around 02/22/2023) for  Left GTB and left cervical TPI, in clinic NS.      Status post L4-L5 ESI #1 on 10/16/2019, repeat.  Left L4 Sprint peripheral nerve stimulation 02/19/2020; removed 05/08/2020., right L4 Sprint peripheral nerve stimulation 03/13/2020, lead fracture, replaced 04/15/2020, right removed 06/20/2020.  Right axillary peripheral nerve stimulation 07/29/2020.           Recent Visits Date Type Provider Dept  12/16/22 Procedure visit Vickie Jolly, MD [facility]  11/23/22 Office Visit Vickie Jolly, MD [facility]  Showing recent visits within past 90 days and meeting all other requirements Today's Visits Date Type Provider Dept  02/11/23 Office Visit Vickie Jolly, MD [facility]  Showing today's visits and meeting all other requirements Future Appointments Date Type Provider Dept  03/01/23 Appointment Vickie Jolly, MD [facility]  Showing future appointments within next 90 days and meeting all other requirements  I discussed the assessment and treatment plan with the patient. The patient was provided an opportunity to ask questions and all were answered. The patient agreed with the plan and demonstrated an understanding of the instructions.  Patient advised to call back or seek an in-person evaluation if the symptoms or condition worsens.  Duration of encounter: .  Total time on encounter, as per AMA guidelines included both the face-to-face and non-face-to-face time personally spent by the physician and/or other qualified health care professional(s) on the day of the encounter (includes time in activities that require the physician or other qualified health care professional and does not include time in activities normally performed by clinical staff). Physician's time may include the following activities when performed: Preparing to see the patient (e.g., pre-charting review of records, searching for previously ordered imaging, lab work, and nerve  conduction tests) Review of prior analgesic pharmacotherapies. Reviewing PMP Interpreting ordered tests (e.g., lab work, imaging, nerve conduction tests) Performing post-procedure evaluations, including interpretation of diagnostic procedures Obtaining and/or reviewing separately obtained history Performing a medically appropriate examination and/or evaluation Counseling and educating the patient/family/caregiver Ordering medications, tests, or procedures Referring  and communicating with other health care professionals (when not separately reported) Documenting clinical information in the electronic or other health record Independently interpreting results (not separately reported) and communicating results to the patient/ family/caregiver Care coordination (not separately reported)  Note by: Vickie Jolly, MD Date: 02/11/2023; Time: 11:28 AM

## 2023-02-12 ENCOUNTER — Ambulatory Visit: Payer: Medicare PPO | Admitting: Internal Medicine

## 2023-02-12 DIAGNOSIS — S43429A Sprain of unspecified rotator cuff capsule, initial encounter: Secondary | ICD-10-CM | POA: Diagnosis not present

## 2023-02-12 DIAGNOSIS — M5030 Other cervical disc degeneration, unspecified cervical region: Secondary | ICD-10-CM | POA: Diagnosis not present

## 2023-02-12 DIAGNOSIS — M47816 Spondylosis without myelopathy or radiculopathy, lumbar region: Secondary | ICD-10-CM | POA: Diagnosis not present

## 2023-02-15 ENCOUNTER — Other Ambulatory Visit: Payer: Self-pay | Admitting: Student in an Organized Health Care Education/Training Program

## 2023-02-15 DIAGNOSIS — M5416 Radiculopathy, lumbar region: Secondary | ICD-10-CM

## 2023-02-15 DIAGNOSIS — M48062 Spinal stenosis, lumbar region with neurogenic claudication: Secondary | ICD-10-CM

## 2023-02-22 ENCOUNTER — Other Ambulatory Visit: Payer: Self-pay | Admitting: Internal Medicine

## 2023-02-25 ENCOUNTER — Other Ambulatory Visit: Payer: Medicare PPO

## 2023-02-25 ENCOUNTER — Ambulatory Visit
Admission: RE | Admit: 2023-02-25 | Discharge: 2023-02-25 | Disposition: A | Discharge status: Home / Self Care | Payer: Medicare PPO | Source: Ambulatory Visit | Attending: Student in an Organized Health Care Education/Training Program | Admitting: Student in an Organized Health Care Education/Training Program

## 2023-02-25 DIAGNOSIS — I10 Essential (primary) hypertension: Secondary | ICD-10-CM | POA: Diagnosis not present

## 2023-02-25 DIAGNOSIS — E782 Mixed hyperlipidemia: Secondary | ICD-10-CM | POA: Diagnosis not present

## 2023-02-25 DIAGNOSIS — M48061 Spinal stenosis, lumbar region without neurogenic claudication: Secondary | ICD-10-CM | POA: Diagnosis not present

## 2023-02-25 DIAGNOSIS — M48062 Spinal stenosis, lumbar region with neurogenic claudication: Secondary | ICD-10-CM | POA: Insufficient documentation

## 2023-02-25 DIAGNOSIS — E119 Type 2 diabetes mellitus without complications: Secondary | ICD-10-CM | POA: Diagnosis not present

## 2023-02-25 DIAGNOSIS — M5136 Other intervertebral disc degeneration, lumbar region: Secondary | ICD-10-CM | POA: Diagnosis not present

## 2023-02-25 DIAGNOSIS — M545 Low back pain, unspecified: Secondary | ICD-10-CM | POA: Diagnosis not present

## 2023-02-25 DIAGNOSIS — M47816 Spondylosis without myelopathy or radiculopathy, lumbar region: Secondary | ICD-10-CM | POA: Diagnosis not present

## 2023-02-25 DIAGNOSIS — M5416 Radiculopathy, lumbar region: Secondary | ICD-10-CM | POA: Insufficient documentation

## 2023-02-26 LAB — COMPREHENSIVE METABOLIC PANEL WITH GFR
ALT: 11 [IU]/L (ref 0–32)
AST: 14 [IU]/L (ref 0–40)
Albumin: 3.8 g/dL (ref 3.8–4.9)
Alkaline Phosphatase: 122 [IU]/L — ABNORMAL HIGH (ref 44–121)
BUN/Creatinine Ratio: 19 (ref 9–23)
BUN: 12 mg/dL (ref 6–24)
Bilirubin Total: 0.3 mg/dL (ref 0.0–1.2)
CO2: 24 mmol/L (ref 20–29)
Calcium: 9.5 mg/dL (ref 8.7–10.2)
Chloride: 106 mmol/L (ref 96–106)
Creatinine, Ser: 0.64 mg/dL (ref 0.57–1.00)
Globulin, Total: 2 g/dL (ref 1.5–4.5)
Glucose: 120 mg/dL — ABNORMAL HIGH (ref 70–99)
Potassium: 5.3 mmol/L — ABNORMAL HIGH (ref 3.5–5.2)
Sodium: 145 mmol/L — ABNORMAL HIGH (ref 134–144)
Total Protein: 5.8 g/dL — ABNORMAL LOW (ref 6.0–8.5)
eGFR: 107 mL/min/{1.73_m2}

## 2023-02-26 LAB — LIPID PANEL
Chol/HDL Ratio: 3.9 ratio (ref 0.0–4.4)
Cholesterol, Total: 222 mg/dL — ABNORMAL HIGH (ref 100–199)
HDL: 57 mg/dL (ref >39)
LDL Chol Calc (NIH): 132 mg/dL — ABNORMAL HIGH (ref 0–99)
Triglycerides: 184 mg/dL — ABNORMAL HIGH (ref 0–149)
VLDL Cholesterol Cal: 33 mg/dL (ref 5–40)

## 2023-02-26 LAB — HEMOGLOBIN A1C
Est. average glucose Bld gHb Est-mCnc: 140 mg/dL
Hgb A1c MFr Bld: 6.5 % — ABNORMAL HIGH (ref 4.8–5.6)

## 2023-03-01 ENCOUNTER — Ambulatory Visit: Payer: Medicare PPO | Admitting: Internal Medicine

## 2023-03-01 ENCOUNTER — Encounter: Payer: Self-pay | Admitting: Student in an Organized Health Care Education/Training Program

## 2023-03-01 ENCOUNTER — Ambulatory Visit: Admission: RE | Admit: 2023-03-01 | Payer: Medicare PPO | Source: Ambulatory Visit

## 2023-03-01 ENCOUNTER — Ambulatory Visit (INDEPENDENT_AMBULATORY_CARE_PROVIDER_SITE_OTHER): Payer: Medicare PPO | Admitting: Internal Medicine

## 2023-03-01 ENCOUNTER — Encounter: Payer: Self-pay | Admitting: Internal Medicine

## 2023-03-01 ENCOUNTER — Ambulatory Visit
Payer: Medicare PPO | Attending: Student in an Organized Health Care Education/Training Program | Admitting: Student in an Organized Health Care Education/Training Program

## 2023-03-01 VITALS — BP 150/110 | HR 71 | Ht 68.0 in | Wt 233.0 lb

## 2023-03-01 VITALS — BP 168/94 | HR 78 | Temp 97.2°F | Resp 18 | Ht 68.0 in | Wt 230.0 lb

## 2023-03-01 DIAGNOSIS — M7062 Trochanteric bursitis, left hip: Secondary | ICD-10-CM | POA: Insufficient documentation

## 2023-03-01 DIAGNOSIS — E119 Type 2 diabetes mellitus without complications: Secondary | ICD-10-CM | POA: Diagnosis not present

## 2023-03-01 DIAGNOSIS — M7918 Myalgia, other site: Secondary | ICD-10-CM | POA: Diagnosis not present

## 2023-03-01 DIAGNOSIS — E782 Mixed hyperlipidemia: Secondary | ICD-10-CM

## 2023-03-01 DIAGNOSIS — I10 Essential (primary) hypertension: Secondary | ICD-10-CM | POA: Diagnosis not present

## 2023-03-01 DIAGNOSIS — M542 Cervicalgia: Secondary | ICD-10-CM | POA: Insufficient documentation

## 2023-03-01 DIAGNOSIS — M25552 Pain in left hip: Secondary | ICD-10-CM | POA: Diagnosis not present

## 2023-03-01 LAB — POC CREATINE & ALBUMIN,URINE
Albumin/Creatinine Ratio, Urine, POC: <30
Creatinine, POC: 100 mg/dL
Microalbumin Ur, POC: 10 mg/L

## 2023-03-01 LAB — POCT CBG (FASTING - GLUCOSE)-MANUAL ENTRY: Glucose Fasting, POC: 98 mg/dL (ref 70–99)

## 2023-03-01 MED ORDER — LIDOCAINE HCL 2 % IJ SOLN
20.0000 mL | Freq: Once | INTRAMUSCULAR | Status: AC
  Administered 2023-03-01: 400 mg
  Filled 2023-03-01: qty 40

## 2023-03-01 MED ORDER — METHYLPREDNISOLONE ACETATE 40 MG/ML IJ SUSP
40.0000 mg | Freq: Once | INTRAMUSCULAR | Status: AC
  Administered 2023-03-01: 40 mg via INTRA_ARTICULAR
  Filled 2023-03-01: qty 1

## 2023-03-01 MED ORDER — IOHEXOL 180 MG/ML  SOLN
10.0000 mL | Freq: Once | INTRAMUSCULAR | Status: AC
  Administered 2023-03-01: 10 mL via INTRA_ARTICULAR
  Filled 2023-03-01: qty 20

## 2023-03-01 MED ORDER — ROPIVACAINE HCL 2 MG/ML IJ SOLN
9.0000 mL | Freq: Once | INTRAMUSCULAR | Status: AC
  Administered 2023-03-01: 9 mL via PERINEURAL
  Filled 2023-03-01: qty 20

## 2023-03-01 NOTE — Patient Instructions (Signed)

## 2023-03-01 NOTE — Addendum Note (Signed)
Addended by: Gwynne Edinger on: 03/01/2023 12:36 PM   Modules accepted: Orders

## 2023-03-01 NOTE — Progress Notes (Signed)
PROVIDER NOTE: Interpretation of information contained herein should be left to medically-trained personnel. Specific patient instructions are provided elsewhere under "Patient Instructions" section of medical record. This document was created in part using STT-dictation technology, any transcriptional errors that may result from this process are unintentional.  Patient: Vickie Stevens Type: Established DOB: [birth year] MRN: [id number] PCP: Sherron Monday, MD  Service: Procedure DOS: 03/01/2023 Setting: Ambulatory Location: Ambulatory outpatient facility Delivery: Face-to-face Provider: Edward Jolly, MD Specialty: Interventional Pain Management Specialty designation: 09 Location: Outpatient facility Ref. Prov.: Sherron Monday, MD       Interventional Therapy   Primary Reason for Visit: Interventional Pain Management Treatment. CC: Hip Pain (Left) and Neck Pain (Right)    Procedure:          Anesthesia, Analgesia, Anxiolysis:  Type: Hip bursa injection #1  Primary Purpose: Diagnostic Region: Upper (proximal) Femoral Region Level: Hip Joint Target Area: Trochanteric Bursa Approach: Anterior approach Laterality: Left  Type: Local Anesthesia Local Anesthetic: Lidocaine 1-2% Sedation: None  Indication(s):  Analgesia Route: Infiltration ([place]/IM) IV Access: N/A   Position: Supine   1. Left hip pain   2. Greater trochanteric bursitis of left hip    Also a right trigger point injection for right cervical pain  NAS-11 Pain score:   Pre-procedure: 6 /10   Post-procedure: 6  (neck, 0 for hip)/10     H&P (Pre-op Assessment):  Vickie Stevens is a 52 y.o. (year old), female patient, seen today for interventional treatment. She  has a past surgical history that includes Endometrial ablation (01-2011); Shoulder surgery (Left, 07-1999, 07-2011); Cataract extraction, bilateral; Esophagogastroduodenoscopy (egd) with propofol (N/A, 06/08/2018); Colonoscopy with propofol (N/A, 06/08/2018);  and SCS 8/ 2021. Vickie Stevens has a current medication list which includes the following prescription(s): accu-chek fastclix lancets, accu-chek guide, atorvastatin, accu-chek guide, belbuca, carvedilol, vitamin d3, clobetasol ointment, fluticasone, accu-chek fastclix lancet, mexiletine, quetiapine, valacyclovir, and vitamin d (ergocalciferol). Her primarily concern today is the Hip Pain (Left) and Neck Pain (Right)  Initial Vital Signs:  Pulse/HCG Rate: 78ECG Heart Rate: 71 Temp: (!) 97.2 F (36.2 C) Resp: 18 BP: (!) 142/101 SpO2: 100 %  BMI: Estimated body mass index is 34.97 kg/m as calculated from the following:   Height as of this encounter: 5\' 8"  (1.727 m).   Weight as of this encounter: 230 lb (104.3 kg).  Risk Assessment: Allergies: Reviewed. She is allergic to phenergan [promethazine hcl], codeine, cortisone, lidocaine, and oxycodone.  Allergy Precautions: None required Coagulopathies: Reviewed. None identified.  Blood-thinner therapy: None at this time Active Infection(s): Reviewed. None identified. Vickie Stevens is afebrile  Site Confirmation: Vickie Stevens was asked to confirm the procedure and laterality before marking the site Procedure checklist: Completed Consent: Before the procedure and under the influence of no sedative(s), amnesic(s), or anxiolytics, the patient was informed of the treatment options, risks and possible complications. To fulfill our ethical and legal obligations, as recommended by the American Medical Association's Code of Ethics, I have informed the patient of my clinical impression; the nature and purpose of the treatment or procedure; the risks, benefits, and possible complications of the intervention; the alternatives, including doing nothing; the risk(s) and benefit(s) of the alternative treatment(s) or procedure(s); and the risk(s) and benefit(s) of doing nothing. The patient was provided information about the general risks and possible complications  associated with the procedure. These may include, but are not limited to: failure to achieve desired goals, infection, bleeding, organ or nerve damage, allergic reactions, paralysis, and death.  In addition, the patient was informed of those risks and complications associated to the procedure, such as failure to decrease pain; infection; bleeding; organ or nerve damage with subsequent damage to sensory, motor, and/or autonomic systems, resulting in permanent pain, numbness, and/or weakness of one or several areas of the body; allergic reactions; (i.e.: anaphylactic reaction); and/or death. Furthermore, the patient was informed of those risks and complications associated with the medications. These include, but are not limited to: allergic reactions (i.e.: anaphylactic or anaphylactoid reaction(s)); adrenal axis suppression; blood sugar elevation that in diabetics may result in ketoacidosis or comma; water retention that in patients with history of congestive heart failure may result in shortness of breath, pulmonary edema, and decompensation with resultant heart failure; weight gain; swelling or edema; medication-induced neural toxicity; particulate matter embolism and blood vessel occlusion with resultant organ, and/or nervous system infarction; and/or aseptic necrosis of one or more joints. Finally, the patient was informed that Medicine is not an exact science; therefore, there is also the possibility of unforeseen or unpredictable risks and/or possible complications that may result in a catastrophic outcome. The patient indicated having understood very clearly. We have given the patient no guarantees and we have made no promises. Enough time was given to the patient to ask questions, all of which were answered to the patient's satisfaction. Vickie Stevens has indicated that she wanted to continue with the procedure. Attestation: I, the ordering provider, attest that I have discussed with the patient the  benefits, risks, side-effects, alternatives, likelihood of achieving goals, and potential problems during recovery for the procedure that I have provided informed consent. Date  Time: 03/01/2023  9:34 AM  Pre-Procedure Preparation:  Monitoring: As per clinic protocol. Respiration, ETCO2, SpO2, BP, heart rate and rhythm monitor placed and checked for adequate function Safety Precautions: Patient was assessed for positional comfort and pressure points before starting the procedure. Time-out: I initiated and conducted the "Time-out" before starting the procedure, as per protocol. The patient was asked to participate by confirming the accuracy of the "Time Out" information. Verification of the correct person, site, and procedure were performed and confirmed by me, the nursing staff, and the patient. "Time-out" conducted as per Joint Commission's Universal Protocol (UP.01.01.01). Time: 1000 Start Time: 1000 hrs.  Description of Procedure:          Area Prepped: Entire Posterolateral hip area. DuraPrep (Iodine Povacrylex [0.7% available iodine] and Isopropyl Alcohol, 74% w/w) Safety Precautions: Aspiration looking for blood return was conducted prior to all injections. At no point did we inject any substances, as a needle was being advanced. No attempts were made at seeking any paresthesias. Safe injection practices and needle disposal techniques used. Medications properly checked for expiration dates. SDV (single dose vial) medications used. Description of the Procedure: Protocol guidelines were followed. The patient was placed in position over the procedure table. The target area was identified and the area prepped in the usual manner. Skin & deeper tissues infiltrated with local anesthetic. Appropriate amount of time allowed to pass for local anesthetics to take effect. The procedure needles were then advanced to the target area. Proper needle placement secured. Negative aspiration confirmed. Solution  injected in intermittent fashion, asking for systemic symptoms every 0.5cc of injectate. The needles were then removed and the area cleansed, making sure to leave some of the prepping solution back to take advantage of its long term bactericidal properties. Vitals:   03/01/23 0940 03/01/23 1000 03/01/23 1007  BP: (!) 142/101 (!) 145/90 (!) 168/94  Pulse: 78    Resp: 18 16 18   Temp: (!) 97.2 F (36.2 C)    TempSrc: Temporal    SpO2:  100% 98%  Weight: 230 lb (104.3 kg)    Height: 5\' 8"  (1.727 m)      Start Time: 1000 hrs. End Time: 1006 hrs.           Materials:  Needle(s) Type: Spinal Needle Gauge: 22G Length: 3.5-in Medication(s): Please see orders for medications and dosing details.  6 cc solution made of 5 cc of 0.2% ropivacaine, 1 cc of methylprednisolone 40 mg/cc.  Injected into the left trochanteric bursa after contrast confirmation   Afterwards a trigger point injection was also done in the patient's right sternocleidomastoid muscle.  0.5 to 1 cc of 0.2% ropivacaine was injected into the trigger point in this region.  Imaging Guidance (Non-Spinal):          Type of Imaging Technique: Fluoroscopy Guidance (Non-Spinal) Indication(s): Assistance in needle guidance and placement for procedures requiring needle placement in or near specific anatomical locations not easily accessible without such assistance. Exposure Time: Please see nurses notes. Contrast: Before injecting any contrast, we confirmed that the patient did not have an allergy to iodine, shellfish, or radiological contrast. Once satisfactory needle placement was completed at the desired level, radiological contrast was injected. Contrast injected under live fluoroscopy. No contrast complications. See chart for type and volume of contrast used. Fluoroscopic Guidance: I was personally present during the use of fluoroscopy. "Tunnel Vision Technique" used to obtain the best possible view of the target area. Parallax  error corrected before commencing the procedure. "Direction-depth-direction" technique used to introduce the needle under continuous pulsed fluoroscopy. Once target was reached, antero-posterior, oblique, and lateral fluoroscopic projection used confirm needle placement in all planes. Images permanently stored in EMR. Interpretation: I personally interpreted the imaging intraoperatively. Adequate needle placement confirmed in multiple planes. Appropriate spread of contrast into desired area was observed. No evidence of afferent or efferent intravascular uptake. Permanent images saved into the patient's record.  Antibiotic Prophylaxis:   Anti-infectives (From admission, onward)    None      Indication(s): None identified  Post-operative Assessment:  Post-procedure Vital Signs:  Pulse/HCG Rate: 7870 Temp: (!) 97.2 F (36.2 C) Resp: 18 BP: (!) 168/94 SpO2: 98 %  EBL: None  Complications: No immediate post-treatment complications observed by team, or reported by patient.  Note: The patient tolerated the entire procedure well. A repeat set of vitals were taken after the procedure and the patient was kept under observation following institutional policy, for this type of procedure. Post-procedural neurological assessment was performed, showing return to baseline, prior to discharge. The patient was provided with post-procedure discharge instructions, including a section on how to identify potential problems. Should any problems arise concerning this procedure, the patient was given instructions to immediately contact us, at any time, without hesitation. In any case, we plan to contact the patient by telephone for a follow-up status report regarding this interventional procedure.  Comments:  No additional relevant information.  Plan of Care (POC)  Orders:  Orders Placed This Encounter  Procedures   DG PAIN CLINIC C-ARM 1-60 MIN NO REPORT    Intraoperative interpretation by procedural  physician at [facility].    Standing Status:   Standing    Number of Occurrences:   1    Order Specific Question:   Reason for exam:    Answer:   Assistance in needle guidance and placement  for procedures requiring needle placement in or near specific anatomical locations not easily accessible without such assistance.   Chronic Opioid Analgesic:  Belbuca 150 mcg twice daily    Medications ordered for procedure: Meds ordered this encounter  Medications   iohexol (OMNIPAQUE) 180 MG/ML injection 10 mL    Must be Myelogram-compatible. If not available, you may substitute with a water-soluble, non-ionic, hypoallergenic, myelogram-compatible radiological contrast medium.   lidocaine (XYLOCAINE) 2 % (with pres) injection 400 mg   methylPREDNISolone acetate (DEPO-MEDROL) injection 40 mg   ropivacaine (PF) 2 mg/mL (0.2%) (NAROPIN) injection 9 mL   Medications administered: We administered iohexol, lidocaine, methylPREDNISolone acetate, and ropivacaine (PF) 2 mg/mL (0.2%).  See the medical record for exact dosing, route, and time of administration.  Follow-up plan:   Return in about 4 weeks (around 03/29/2023) for VV PPE.       Recent Visits Date Type Provider Dept  02/11/23 Office Visit Edward Jolly, MD [facility]  12/16/22 Procedure visit Edward Jolly, MD [facility]  Showing recent visits within past 90 days and meeting all other requirements Today's Visits Date Type Provider Dept  03/01/23 Procedure visit Edward Jolly, MD [facility]  Showing today's visits and meeting all other requirements Future Appointments Date Type Provider Dept  03/29/23 Appointment Edward Jolly, MD [facility]  Showing future appointments within next 90 days and meeting all other requirements  Disposition: Discharge home  Discharge (Date  Time): 03/01/2023; 1015 hrs.   Primary Care Physician: Sherron Monday, MD Location: [facility] Note by: Edward Jolly, MD (TTS technology used. I apologize for any typographical errors that were not detected and corrected.) Date: 03/01/2023; Time: 11:20 AM  Disclaimer:  Medicine is not an Visual merchandiser. The only guarantee in medicine is that nothing is guaranteed. It is important to note that the decision to proceed with this intervention was based on the information collected from the patient. The Data and conclusions were drawn from the patient's questionnaire, the interview, and the physical examination. Because the information was provided in large part by the patient, it cannot be guaranteed that it has not been purposely or unconsciously manipulated. Every effort has been made to obtain as much relevant data as possible for this evaluation. It is important to note that the conclusions that lead to this procedure are derived in large part from the available data. Always take into account that the treatment will also be dependent on availability of resources and existing treatment guidelines, considered by other Pain Management Practitioners as being common knowledge and practice, at the time of the intervention. For Medico-Legal purposes, it is also important to point out that variation in procedural techniques and pharmacological choices are the acceptable norm. The indications, contraindications, technique, and results of the above procedure should only be interpreted and judged by a Board-Certified Interventional Pain Specialist with extensive familiarity and expertise in the same exact procedure and technique.

## 2023-03-01 NOTE — Progress Notes (Signed)
Safety precautions to be maintained throughout the outpatient stay will include: orient to surroundings, keep bed in low position, maintain call bell within reach at all times, provide assistance with transfer out of bed and ambulation.  

## 2023-03-01 NOTE — Progress Notes (Signed)
Established Patient Office Visit  Subjective:  Patient ID: Vickie Stevens, female    DOB: [birth year]  Age: 52 y.o. MRN: [id number]  Chief Complaint  Patient presents with   Follow-up    2 mo f/u w/lab results    No new complaints, here for lab review and medication refills. Labs reviewed and notable for well controlled diabetes, A1c at target, lipids not at target with unremarkable cmp. Denies any hypoglycemic episodes and home bg readings have been at target. BP has been elevated for the last 1-2 months since switched to Carvedilol.    No other concerns at this time.   Past Medical History:  Diagnosis Date   ADHD (attention deficit hyperactivity disorder)    Biceps tendonitis    Bipolar disorder (HCC)    Chronic back pain    Cluster headache    Crohn[place]ane disease (HCC)    DDD (degenerative disc disease), lumbar    Depression    Diabetes mellitus without complication (HCC)    Hypercholesteremia    Hypertension    PCOS (polycystic ovarian syndrome)    Rotator cuff tendinitis    Sleep apnea    Tuberous sclerosis (HCC)     Past Surgical History:  Procedure Laterality Date   CATARACT EXTRACTION, BILATERAL     COLONOSCOPY WITH PROPOFOL N/A 06/08/2018   Procedure: COLONOSCOPY WITH PROPOFOL;  Surgeon: Christena Deem, MD;  Location: [facility] ENDOSCOPY;  Service: Endoscopy;  Laterality: N/A;   ENDOMETRIAL ABLATION  01-2011   ESOPHAGOGASTRODUODENOSCOPY (EGD) WITH PROPOFOL N/A 06/08/2018   Procedure: ESOPHAGOGASTRODUODENOSCOPY (EGD) WITH PROPOFOL;  Surgeon: Christena Deem, MD;  Location: [facility] ENDOSCOPY;  Service: Endoscopy;  Laterality: N/A;   SCS 8/ 2021     SHOULDER SURGERY Left 07-1999, 07-2011   x 4    Social History   Socioeconomic History   Marital status: Divorced    Spouse name: Not on file   Number of children: Not on file   Years of education: Not on file   Highest education level: Not on file  Occupational History   Not on file  Tobacco Use   Smoking  status: Never   Smokeless tobacco: Never  Vaping Use   Vaping status: Never Used  Substance and Sexual Activity   Alcohol use: No   Drug use: Never   Sexual activity: Not on file  Other Topics Concern   Not on file  Social History Narrative   Not on file   Social Determinants of Health   Financial Resource Strain: Medium Risk (12/16/2022)   Received from [facility], [facility]   Overall Financial Resource Strain (CARDIA)    Difficulty of Paying Living Expenses: Somewhat hard  Food Insecurity: No Food Insecurity (12/16/2022)   Received from [facility], [facility]   Hunger Vital Sign    Worried About Running Out of Food in the Last Year: Never true    Ran Out of Food in the Last Year: Never true  Transportation Needs: Unmet Transportation Needs (12/16/2022)   Received from [facility], [facility]   [facility] - Transportation    In the past 12 months, has lack of transportation kept you from medical appointments or from getting medications?: Yes    Lack of Transportation (Non-Medical): Yes  Physical Activity: Insufficiently Active (04/09/2021)   Received from [facility], [facility]   Exercise Vital Sign    Days of Exercise  per Week: 1 day    Minutes of Exercise per Session: 30 min  Stress: Stress Concern Present (04/09/2021)   Received from [facility], [facility]   [employer] [facility] Occupational [facility] - Occupational Stress Questionnaire    Feeling of Stress : Very much  Social Connections: Not on file  Intimate Partner Violence: Not on file    Family History  Problem Relation Age of Onset   Diabetes Father    Hyperlipidemia Father    Hypertension Father    Breast cancer Maternal Aunt     Allergies  Allergen Reactions   Phenergan [Promethazine Hcl] Other (See Comments)    Altered  mental status; patient remarked that she ripped out her IV and tore up the hospital room   Codeine Hives   Cortisone Other (See Comments)    Steroid injections may have contributed to cataracts   Lidocaine Hives    With oral, only   Oxycodone Diarrhea and Rash    Pruritic rash, also    Review of Systems  Constitutional: Negative.        Gained weight  HENT: Negative.    Eyes: Negative.   Respiratory: Negative.    Cardiovascular: Negative.   Gastrointestinal: Negative.   Genitourinary: Negative.   Skin: Negative.   Neurological: Negative.   Endo/Heme/Allergies: Negative.        Objective:   BP (!) 150/110   Pulse 71   Ht 5\' 8"  (1.727 m)   Wt 233 lb (105.7 kg)   SpO2 97%   BMI 35.43 kg/m   Vitals:   03/01/23 1133  BP: (!) 150/110  Pulse: 71  Height: 5\' 8"  (1.727 m)  Weight: 233 lb (105.7 kg)  SpO2: 97%  BMI (Calculated): 35.44    Physical Exam Vitals reviewed.  Constitutional:      General: She is not in acute distress.    Appearance: She is obese.  HENT:     Head: Normocephalic.     Nose: Nose normal.     Mouth/Throat:     Mouth: Mucous membranes are moist.  Eyes:     Extraocular Movements: Extraocular movements intact.     Pupils: Pupils are equal, round, and reactive to light.  Cardiovascular:     Rate and Rhythm: Normal rate and regular rhythm.     Heart sounds: No murmur heard. Pulmonary:     Effort: Pulmonary effort is normal.     Breath sounds: No rhonchi or rales.  Abdominal:     General: Abdomen is flat.     Palpations: There is no hepatomegaly, splenomegaly or mass.  Musculoskeletal:        General: Normal range of motion.     Cervical back: Normal range of motion. No tenderness.  Skin:    General: Skin is warm and dry.  Neurological:     General: No focal deficit present.     Mental Status: She is alert and oriented to person, place, and time.     Cranial Nerves: No cranial nerve deficit.     Motor: No weakness.  Psychiatric:         Mood and Affect: Mood normal.        Behavior: Behavior normal.      Results for orders placed or performed in visit on 03/01/23  POCT CBG (Fasting - Glucose)  Result Value Ref Range   Glucose Fasting, POC 98 70 - 99 mg/dL    Recent Results (from the past  2160 hour(Avangeline Stockburger))  POC CREATINE & ALBUMIN,URINE     Status: Abnormal   Collection Time: 12/15/22 11:43 AM  Result Value Ref Range   Microalbumin Ur, POC 150 mg/L   Creatinine, POC 300 mg/dL   Albumin/Creatinine Ratio, Urine, POC 30-300   Lipid panel     Status: Abnormal   Collection Time: 02/25/23  9:36 AM  Result Value Ref Range   Cholesterol, Total 222 (H) 100 - 199 mg/dL   Triglycerides 846 (H) 0 - 149 mg/dL   HDL 57 >96 mg/dL   VLDL Cholesterol Cal 33 5 - 40 mg/dL   LDL Chol Calc (NIH) 295 (H) 0 - 99 mg/dL   Chol/HDL Ratio 3.9 0.0 - 4.4 ratio    Comment:                                   T. Chol/HDL Ratio                                             Men  Women                               1/2 Avg.Risk  3.4    3.3                                   Avg.Risk  5.0    4.4                                2X Avg.Risk  9.6    7.1                                3X Avg.Risk 23.4   11.0   Hemoglobin A1c     Status: Abnormal   Collection Time: 02/25/23  9:36 AM  Result Value Ref Range   Hgb A1c MFr Bld 6.5 (H) 4.8 - 5.6 %    Comment:          Prediabetes: 5.7 - 6.4          Diabetes: >6.4          Glycemic control for adults with diabetes: <7.0    Est. average glucose Bld gHb Est-mCnc 140 mg/dL  Comprehensive metabolic panel     Status: Abnormal   Collection Time: 02/25/23  9:36 AM  Result Value Ref Range   Glucose 120 (H) 70 - 99 mg/dL   BUN 12 6 - 24 mg/dL   Creatinine, Ser 2.84 0.57 - 1.00 mg/dL   eGFR 132 >44 WN/UUV/2.53   BUN/Creatinine Ratio 19 9 - 23   Sodium 145 (H) 134 - 144 mmol/L   Potassium 5.3 (H) 3.5 - 5.2 mmol/L   Chloride 106 96 - 106 mmol/L   CO2 24 20 - 29 mmol/L   Calcium 9.5 8.7 - 10.2 mg/dL    Total Protein 5.8 (L) 6.0 - 8.5 g/dL   Albumin 3.8 3.8 - 4.9 g/dL   Globulin, Total 2.0 1.5 - 4.5 g/dL   Bilirubin Total 0.3 0.0 - 1.2 mg/dL  Alkaline Phosphatase 122 (H) 44 - 121 IU/L   AST 14 0 - 40 IU/L   ALT 11 0 - 32 IU/L  POCT CBG (Fasting - Glucose)     Status: None   Collection Time: 03/01/23 11:37 AM  Result Value Ref Range   Glucose Fasting, POC 98 70 - 99 mg/dL      Assessment & Plan:   As per problem list. Advised to double Carvedilol until she can contact her Cardiologist.  Problem List Items Addressed This Visit       Endocrine   Type 2 diabetes mellitus (HCC)   Relevant Orders   POCT CBG (Fasting - Glucose) (Completed)     Other   Mixed hyperlipidemia - Primary    Return in about 3 months (around 06/01/2023) for fu with labs prior.   Total time spent: 20 minutes  Luna Fuse, MD  03/01/2023   This document may have been prepared by [facility] Voice Recognition software and as such may include unintentional dictation errors.

## 2023-03-02 ENCOUNTER — Telehealth: Payer: Self-pay

## 2023-03-02 NOTE — Telephone Encounter (Signed)
Post procedure follow up.  Patient states she is doing ok.  

## 2023-03-05 DIAGNOSIS — G4733 Obstructive sleep apnea (adult) (pediatric): Secondary | ICD-10-CM | POA: Diagnosis not present

## 2023-03-06 ENCOUNTER — Other Ambulatory Visit: Payer: Self-pay | Admitting: Family

## 2023-03-15 DIAGNOSIS — M47816 Spondylosis without myelopathy or radiculopathy, lumbar region: Secondary | ICD-10-CM | POA: Diagnosis not present

## 2023-03-15 DIAGNOSIS — S43429A Sprain of unspecified rotator cuff capsule, initial encounter: Secondary | ICD-10-CM | POA: Diagnosis not present

## 2023-03-15 DIAGNOSIS — M5030 Other cervical disc degeneration, unspecified cervical region: Secondary | ICD-10-CM | POA: Diagnosis not present

## 2023-03-27 DIAGNOSIS — G7111 Myotonic muscular dystrophy: Secondary | ICD-10-CM | POA: Diagnosis not present

## 2023-03-27 DIAGNOSIS — R079 Chest pain, unspecified: Secondary | ICD-10-CM | POA: Diagnosis not present

## 2023-03-27 DIAGNOSIS — R531 Weakness: Secondary | ICD-10-CM | POA: Diagnosis not present

## 2023-03-27 DIAGNOSIS — R29898 Other symptoms and signs involving the musculoskeletal system: Secondary | ICD-10-CM | POA: Diagnosis not present

## 2023-03-27 DIAGNOSIS — R0789 Other chest pain: Secondary | ICD-10-CM | POA: Diagnosis not present

## 2023-03-27 DIAGNOSIS — G459 Transient cerebral ischemic attack, unspecified: Secondary | ICD-10-CM | POA: Diagnosis not present

## 2023-03-27 DIAGNOSIS — R252 Cramp and spasm: Secondary | ICD-10-CM | POA: Diagnosis not present

## 2023-03-27 DIAGNOSIS — R0781 Pleurodynia: Secondary | ICD-10-CM | POA: Diagnosis not present

## 2023-03-27 DIAGNOSIS — E785 Hyperlipidemia, unspecified: Secondary | ICD-10-CM | POA: Diagnosis not present

## 2023-03-27 DIAGNOSIS — I160 Hypertensive urgency: Secondary | ICD-10-CM | POA: Diagnosis not present

## 2023-03-27 DIAGNOSIS — R202 Paresthesia of skin: Secondary | ICD-10-CM | POA: Diagnosis not present

## 2023-03-27 DIAGNOSIS — R609 Edema, unspecified: Secondary | ICD-10-CM | POA: Diagnosis not present

## 2023-03-27 DIAGNOSIS — I10 Essential (primary) hypertension: Secondary | ICD-10-CM | POA: Diagnosis not present

## 2023-03-27 DIAGNOSIS — K5090 Crohn's disease, unspecified, without complications: Secondary | ICD-10-CM | POA: Diagnosis not present

## 2023-03-28 DIAGNOSIS — M50821 Other cervical disc disorders at C4-C5 level: Secondary | ICD-10-CM | POA: Diagnosis not present

## 2023-03-28 DIAGNOSIS — G7111 Myotonic muscular dystrophy: Secondary | ICD-10-CM | POA: Diagnosis not present

## 2023-03-28 DIAGNOSIS — R29898 Other symptoms and signs involving the musculoskeletal system: Secondary | ICD-10-CM | POA: Diagnosis not present

## 2023-03-28 DIAGNOSIS — M4802 Spinal stenosis, cervical region: Secondary | ICD-10-CM | POA: Diagnosis not present

## 2023-03-28 DIAGNOSIS — M47812 Spondylosis without myelopathy or radiculopathy, cervical region: Secondary | ICD-10-CM | POA: Diagnosis not present

## 2023-03-28 DIAGNOSIS — I160 Hypertensive urgency: Secondary | ICD-10-CM | POA: Diagnosis not present

## 2023-03-28 DIAGNOSIS — G459 Transient cerebral ischemic attack, unspecified: Secondary | ICD-10-CM | POA: Diagnosis not present

## 2023-03-28 DIAGNOSIS — M50222 Other cervical disc displacement at C5-C6 level: Secondary | ICD-10-CM | POA: Diagnosis not present

## 2023-03-28 DIAGNOSIS — R079 Chest pain, unspecified: Secondary | ICD-10-CM | POA: Diagnosis not present

## 2023-03-28 DIAGNOSIS — R202 Paresthesia of skin: Secondary | ICD-10-CM | POA: Diagnosis not present

## 2023-03-29 ENCOUNTER — Encounter: Payer: Self-pay | Admitting: Internal Medicine

## 2023-03-29 ENCOUNTER — Encounter: Payer: Self-pay | Admitting: Student in an Organized Health Care Education/Training Program

## 2023-03-29 ENCOUNTER — Telehealth: Payer: Medicare PPO | Admitting: Student in an Organized Health Care Education/Training Program

## 2023-03-29 DIAGNOSIS — M5412 Radiculopathy, cervical region: Secondary | ICD-10-CM

## 2023-03-29 DIAGNOSIS — R29898 Other symptoms and signs involving the musculoskeletal system: Secondary | ICD-10-CM | POA: Diagnosis not present

## 2023-03-29 DIAGNOSIS — I160 Hypertensive urgency: Secondary | ICD-10-CM | POA: Diagnosis not present

## 2023-03-29 DIAGNOSIS — R079 Chest pain, unspecified: Secondary | ICD-10-CM | POA: Diagnosis not present

## 2023-03-29 DIAGNOSIS — M542 Cervicalgia: Secondary | ICD-10-CM

## 2023-03-30 NOTE — Telephone Encounter (Signed)
Thank you for that information Heino.  I recommend a cervical epidural steroid injection based upon the results.  I will place a as needed order if you are interested in proceeding with that.  Orders Placed This Encounter  Procedures   Cervical Epidural Injection    Sedation: Patient's choice. Purpose: Diagnostic/Therapeutic Indication(s): Radiculitis and cervicalgia associater with cervical degenerative disc disease.    Standing Status:   Future    Standing Expiration Date:   06/29/2023    Scheduling Instructions:     Procedure: Cervical Epidural Steroid Injection/Block     Level(s): C7-T1     Laterality: TBD     Timeframe: As soon as schedule allows    Order Specific Question:   Where will this procedure be performed?    Answer:   [facility]    Comments:   Cherylann Ratel

## 2023-03-31 ENCOUNTER — Encounter: Payer: Self-pay | Admitting: Student in an Organized Health Care Education/Training Program

## 2023-03-31 ENCOUNTER — Encounter: Payer: Self-pay | Admitting: *Deleted

## 2023-03-31 ENCOUNTER — Ambulatory Visit
Payer: Medicare PPO | Attending: Student in an Organized Health Care Education/Training Program | Admitting: Student in an Organized Health Care Education/Training Program

## 2023-03-31 DIAGNOSIS — M7062 Trochanteric bursitis, left hip: Secondary | ICD-10-CM

## 2023-03-31 DIAGNOSIS — G894 Chronic pain syndrome: Secondary | ICD-10-CM | POA: Diagnosis not present

## 2023-03-31 DIAGNOSIS — M25552 Pain in left hip: Secondary | ICD-10-CM

## 2023-03-31 NOTE — Progress Notes (Signed)
Patient: Vickie Stevens  Service Category: E/M  Provider: Edward Jolly, MD  DOB: Mar 03, 1971  DOS: 03/31/2023  Location: Office  MRN: [id number]  Setting: Ambulatory outpatient  Referring Provider: Sherron Monday, MD  Type: Established Patient  Specialty: Interventional Pain Management  PCP: Sherron Monday, MD  Location: Remote location  Delivery: TeleHealth     Virtual Encounter - Pain Management PROVIDER NOTE: Information contained herein reflects review and annotations entered in association with encounter. Interpretation of such information and data should be left to medically-trained personnel. Information provided to patient can be located elsewhere in the medical record under "Patient Instructions". Document created using STT-dictation technology, any transcriptional errors that may result from process are unintentional.    Contact & Pharmacy Preferred: [phone] Home: [phone] (home) Mobile: [phone] (mobile) E-mail: [email or web address]  [facility] - [place], [place] - [address] [place] [place] [address] Phone: [phone] Fax: [phone]  [facility] [place] [place] [facility] [address] [place] [address] Phone: [phone] Fax: [phone]  [facility] [address] [place], [place] - [phone] [place] [address] Phone: [phone] Fax: [phone]  [facility] [address], [place] - [address] [place] [address] Phone: [phone] Fax: [phone]  [facility] - [place], [place] - [address] [place] [address] Phone: [phone] Fax: [phone]   Pre-screening  Ms. Gilkeson offered "in-person" vs "virtual" encounter. She indicated preferring virtual for this encounter.   Reason COVID-19*  Social distancing based on CDC and AMA recommendations.   I contacted Vickie Stevens on 03/31/2023  via telephone.      I clearly identified myself as Edward Jolly, MD. I verified that I was speaking with the correct person using two identifiers (Name: CHANEQUA KASPRZYK, and date of birth: [birth year]).  Consent I sought verbal advanced consent from Vickie Stevens for virtual visit interactions. I informed Ms. Groden of possible security and privacy concerns, risks, and limitations associated with providing "not-in-person" medical evaluation and management services. I also informed Ms. Hamed of the availability of "in-person" appointments. Finally, I informed her that there would be a charge for the virtual visit and that she could be  personally, fully or partially, financially responsible for it. Ms. Gorsline expressed understanding and agreed to proceed.   Historic Elements   Ms. AUNESTY WITTEMAN is a 52 y.o. year old, female patient evaluated today after our last contact on 03/01/2023. Ms. Hor  has a past medical history of ADHD (attention deficit hyperactivity disorder), Biceps tendonitis, Bipolar disorder (HCC), Chronic back pain, Cluster headache, Crohn's disease (HCC), DDD (degenerative disc disease), lumbar, Depression, Diabetes mellitus without complication (HCC), Hypercholesteremia, Hypertension, PCOS (polycystic ovarian syndrome), Rotator cuff tendinitis, Sleep apnea, and Tuberous sclerosis (HCC). She also  has a past surgical history that includes Endometrial ablation (01-2011); Shoulder surgery (Left, 07-1999, 07-2011); Cataract extraction, bilateral; Esophagogastroduodenoscopy (egd) with propofol (N/A, 06/08/2018); Colonoscopy with propofol (N/A, 06/08/2018); and SCS 8/ 2021. Ms. Poncedeleon has a current medication list which includes the following prescription(s): accu-chek fastclix lancets, accu-chek guide, atorvastatin, accu-chek guide, belbuca, carvedilol, vitamin d3, clobetasol ointment, fluticasone, accu-chek fastclix lancet, mexiletine, quetiapine, valacyclovir, and vitamin d  (ergocalciferol). She  reports that she has never smoked. She has never used smokeless tobacco. She reports that she does not drink alcohol and does not use drugs. Ms. Kleve is allergic to phenergan [  promethazine hcl], codeine, cortisone, lidocaine, and oxycodone.  BMI: Estimated body mass index is 35.43 kg/m as calculated from the following:   Height as of 03/01/23: 5\' 8"  (1.727 m).   Weight as of 03/01/23: 233 lb (105.7 kg). Last encounter: 02/11/2023. Last procedure: 03/01/2023.  HPI  Today, she is being contacted for a post-procedure assessment.  Patient states that she is not doing well.  She was admitted to the hospital for 2 days.  She states that she was having chest pain was hypertensive and also had elevated troponins.  She states that she received nitroglycerin which did bring her blood pressure down.  She is having left arm pain and heaviness.  She is somewhat frustrated that she is not able to see her cardiologist.  She did have a cervical MRI done and she states her care team at the hospital thought her left arm pain was coming from her cervical spine.  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  She did not receive any benefit from her hip bursa injection  Post-procedure evaluation    Procedure:          Anesthesia, Analgesia, Anxiolysis:  Type: Hip bursa injection #1  Primary Purpose: Diagnostic Region: Upper (proximal) Femoral Region Level: Hip Joint Target Area: Trochanteric Bursa Approach: Anterior approach Laterality: Left  Type: Local Anesthesia Local Anesthetic: Lidocaine 1-2% Sedation: None  Indication(s):  Analgesia Route: Infiltration (White Signal/IM) IV Access: N/A   Position: Supine   1. Left hip pain   2. Greater trochanteric bursitis of left hip    Also a right trigger point injection for right cervical pain  NAS-11 Pain score:   Pre-procedure:  6 /10   Post-procedure: 6  (neck, 0 for hip)/10      Effectiveness:  Initial hour after procedure: 0 %  Subsequent 4-6 hours post-procedure: 0 %  Analgesia past initial 6 hours: 50 % (lasted 2 weeks)  Ongoing improvement:  Analgesic:  0%     Pharmacotherapy Assessment   Opioid Analgesic: Belbuca 150 mcg twice daily    Monitoring: [place] PMP: PDMP reviewed during this encounter.       Pharmacotherapy: No side-effects or adverse reactions reported. Compliance: No problems identified. Effectiveness: Clinically acceptable. Plan: Refer to "POC". UDS:  Summary  Date Value Ref Range Status  03/05/2022 Note  Final    Comment:    ==================================================================== Tox[employer] Select 13 (MW) ==================================================================== Test                             Result       Flag       Units  Drug Present and Declared for Prescription Verification   Buprenorphine                  4            EXPECTED   ng/mg creat   Norbuprenorphine               14           EXPECTED   ng/mg creat    Source of buprenorphine is a scheduled prescription medication.    Norbuprenorphine is an expected metabolite of buprenorphine.  ==================================================================== Test  Result    Flag   Units      Ref Range   Creatinine              190              mg/dL      >=16 ==================================================================== Declared Medications:  The flagging and interpretation on this report are based on the  following declared medications.  Unexpected results may arise from  inaccuracies in the declared medications.   **Note: The testing scope of this panel does not include small to  moderate amounts of these reported medications:   Buprenorphine (Belbuca)   **Note: The testing scope of this panel does not include the  following reported medications:   Amlodipine  (Norvasc)  Atorvastatin (Lipitor) ==================================================================== For clinical consultation, please call [phone]. ====================================================================    No results found for: "CBDTHCR", "D8THCCBX", "D9THCCBX"   Laboratory Chemistry Profile   Renal Lab Results  Component Value Date   BUN 12 02/25/2023   CREATININE 0.64 02/25/2023   BCR 19 02/25/2023   GFRAA >60 12/08/2017   GFRNONAA >60 12/08/2017    Hepatic Lab Results  Component Value Date   AST 14 02/25/2023   ALT 11 02/25/2023   ALBUMIN 3.8 02/25/2023   ALKPHOS 122 (H) 02/25/2023    Electrolytes Lab Results  Component Value Date   NA 145 (H) 02/25/2023   K 5.3 (H) 02/25/2023   CL 106 02/25/2023   CALCIUM 9.5 02/25/2023    Bone No results found for: "VD25OH", "VD125OH2TOT", "WJ1914NW2", "NF6213YQ6", "25OHVITD1", "25OHVITD2", "25OHVITD3", "TESTOFREE", "TESTOSTERONE"  Inflammation (CRP: Acute Phase) (ESR: Chronic Phase) No results found for: "CRP", "ESRSEDRATE", "LATICACIDVEN"       Note: Above Lab results reviewed.  Imaging  CT LUMBAR SPINE WO CONTRAST CLINICAL DATA:  Low back pain, symptoms persist with greater than 6 weeks of treatment. Left hip pain.  EXAM: CT LUMBAR SPINE WITHOUT CONTRAST  TECHNIQUE: Multidetector CT imaging of the lumbar spine was performed without intravenous contrast administration. Multiplanar CT image reconstructions were also generated.  RADIATION DOSE REDUCTION: This exam was performed according to the departmental dose-optimization program which includes automated exposure control, adjustment of the mA and/or kV according to patient size and/or use of iterative reconstruction technique.  COMPARISON:  MRI 07/05/2011  FINDINGS: Segmentation: 5 lumbar type vertebral bodies. L5 has transitional features.  Alignment: Normal  Vertebrae: No fracture or focal bone lesion.  Paraspinal and other soft  tissues: Normal  Disc levels: No disc level abnormality from T12-L1 through L2-3.  L3-4: Bulging of the disc. Bilateral facet osteoarthritis with facet and ligamentous hypertrophy. Moderate multifactorial stenosis at this level that could be symptomatic. The facet arthritis could be painful.  L4-5: Bulging of the disc. Facet and ligamentous hypertrophy. Moderate multifactorial stenosis at this level that could be symptomatic. The facet arthritis could be painful. Foraminal narrowing on the left that could focally affect the left L4 nerve.  L5-S1: Transitional level.  No narrowing of the canal or foramina.  No significant sacroiliac disease.  IMPRESSION: 1. L3-4: Bulging of the disc. Bilateral facet osteoarthritis with facet and ligamentous hypertrophy. Moderate multifactorial stenosis at this level that could be symptomatic. The facet arthritis could be painful. 2. L4-5: Bulging of the disc. Facet and ligamentous hypertrophy. Moderate multifactorial stenosis at this level that could be symptomatic. The facet arthritis could be painful. Foraminal narrowing on the left that could focally affect the left L4 nerve. 3. L5-S1: Transitional features. No narrowing of the canal  or foramina.  Electronically Signed   By: Paulina Fusi M.D.   On: 03/15/2023 10:39  Assessment  The primary encounter diagnosis was Left hip pain. Diagnoses of Greater trochanteric bursitis of left hip and Chronic pain syndrome were also pertinent to this visit.  Plan of Care  Given the symptoms that the patient is endorsing, I recommend that she follow-up with her cardiologist and her primary care provider.  She states that she has had difficulty making appointment with her cardiologist.  I instructed her to reach out to her primary care provider to see if there is another referral to a cardiologist that he can provide.  I will also send him a message.  We discussed a left cervical epidural steroid injection  however the patient states that she does not feel that her left arm pain is coming from her neck as the MRI finding has been present for many years.  She believes this to be cardiac related in the context of her admission this past weekend.  I informed the patient that obviously if her condition worsens or if she has any return of her chest pain, shortness of breath or uncontrolled hypertension to go back to the ED.  Follow-up plan:   No follow-ups on file.      Status post L4-L5 ESI #1 on 10/16/2019, repeat.  Left L4 Sprint peripheral nerve stimulation 02/19/2020; removed 05/08/2020., right L4 Sprint peripheral nerve stimulation 03/13/2020, lead fracture, replaced 04/15/2020, right removed 06/20/2020.  Right axillary peripheral nerve stimulation 07/29/2020.            Recent Visits Date Type Provider Dept  03/01/23 Procedure visit Edward Jolly, MD [facility]  02/11/23 Office Visit Edward Jolly, MD [facility]  Showing recent visits within past 90 days and meeting all other requirements Today's Visits Date Type Provider Dept  03/31/23 Office Visit Edward Jolly, MD [facility]  Showing today's visits and meeting all other requirements Future Appointments Date Type Provider Dept  04/14/23 Appointment Edward Jolly, MD [facility]  Showing future appointments within next 90 days and meeting all other requirements  I discussed the assessment and treatment plan with the patient. The patient was provided an opportunity to ask questions and all were answered. The patient agreed with the plan and demonstrated an understanding of the instructions.  Patient advised to call back or seek an in-person evaluation if the symptoms or condition worsens.  Duration of encounter: .  Note by: Edward Jolly, MD Date: 03/31/2023; Time: 3:53 PM

## 2023-04-02 ENCOUNTER — Ambulatory Visit (INDEPENDENT_AMBULATORY_CARE_PROVIDER_SITE_OTHER): Payer: Medicare PPO | Admitting: Cardiovascular Disease

## 2023-04-02 ENCOUNTER — Encounter: Payer: Self-pay | Admitting: Cardiovascular Disease

## 2023-04-02 VITALS — BP 160/85 | HR 65 | Ht 68.0 in | Wt 237.0 lb

## 2023-04-02 DIAGNOSIS — R0789 Other chest pain: Secondary | ICD-10-CM | POA: Diagnosis not present

## 2023-04-02 DIAGNOSIS — R42 Dizziness and giddiness: Secondary | ICD-10-CM | POA: Diagnosis not present

## 2023-04-02 DIAGNOSIS — I10 Essential (primary) hypertension: Secondary | ICD-10-CM | POA: Diagnosis not present

## 2023-04-02 DIAGNOSIS — R0602 Shortness of breath: Secondary | ICD-10-CM | POA: Diagnosis not present

## 2023-04-02 DIAGNOSIS — E782 Mixed hyperlipidemia: Secondary | ICD-10-CM | POA: Diagnosis not present

## 2023-04-02 DIAGNOSIS — G7111 Myotonic muscular dystrophy: Secondary | ICD-10-CM | POA: Diagnosis not present

## 2023-04-02 MED ORDER — ASPIRIN 81 MG PO TBEC
81.0000 mg | DELAYED_RELEASE_TABLET | Freq: Every day | ORAL | 3 refills | Status: DC
Start: 2023-04-02 — End: 2023-06-07

## 2023-04-02 MED ORDER — ISOSORBIDE MONONITRATE ER 30 MG PO TB24
30.0000 mg | ORAL_TABLET | Freq: Every day | ORAL | 1 refills | Status: DC
Start: 2023-04-02 — End: 2023-04-26

## 2023-04-02 NOTE — Progress Notes (Signed)
Cardiology Office Note   Date:  04/02/2023   ID:  Vickie Stevens, DOB 1971-05-29, MRN [id number]  PCP:  Sherron Monday, MD  Cardiologist:  Adrian Blackwater, MD      History of Present Illness: Vickie Stevens is a 52 y.o. female who presents for No chief complaint on file.   Admited to [facility] Saturday with chest pain  Chest Pain  This is a new problem. The current episode started more than 1 month ago. The problem has been rapidly worsening. The pain is at a severity of 7/10. The quality of the pain is described as heavy. The pain radiates to the left arm. Associated symptoms include dizziness. Pertinent negatives include no nausea, PND or syncope.      Past Medical History:  Diagnosis Date   ADHD (attention deficit hyperactivity disorder)    Biceps tendonitis    Bipolar disorder (HCC)    Chronic back pain    Cluster headache    Crohn's disease (HCC)    DDD (degenerative disc disease), lumbar    Depression    Diabetes mellitus without complication (HCC)    Hypercholesteremia    Hypertension    PCOS (polycystic ovarian syndrome)    Rotator cuff tendinitis    Sleep apnea    Tuberous sclerosis (HCC)      Past Surgical History:  Procedure Laterality Date   CATARACT EXTRACTION, BILATERAL     COLONOSCOPY WITH PROPOFOL N/A 06/08/2018   Procedure: COLONOSCOPY WITH PROPOFOL;  Surgeon: Christena Deem, MD;  Location: [facility] ENDOSCOPY;  Service: Endoscopy;  Laterality: N/A;   ENDOMETRIAL ABLATION  01-2011   ESOPHAGOGASTRODUODENOSCOPY (EGD) WITH PROPOFOL N/A 06/08/2018   Procedure: ESOPHAGOGASTRODUODENOSCOPY (EGD) WITH PROPOFOL;  Surgeon: Christena Deem, MD;  Location: [facility] ENDOSCOPY;  Service: Endoscopy;  Laterality: N/A;   SCS 8/ 2021     SHOULDER SURGERY Left 07-1999, 07-2011   x 4     Current Outpatient Medications  Medication Sig Dispense Refill   aspirin EC 81 MG tablet Take 1 tablet (81 mg total) by mouth daily. Swallow whole. 90 tablet 3   isosorbide  mononitrate (IMDUR) 30 MG 24 hr tablet Take 1 tablet (30 mg total) by mouth daily. 30 tablet 1   Accu-Chek FastClix Lancets MISC TEST TWO TIMES DAILY 204 each 3   ACCU-CHEK GUIDE test strip TEST BLOOD SUGAR TWICE DAILY 200 strip 3   atorvastatin (LIPITOR) 20 MG tablet TAKE 1 TABLET AT BEDTIME 90 tablet 3   Blood Glucose Monitoring Suppl (ACCU-CHEK GUIDE) w/Device KIT USE AS DIRECTED 1 kit 3   Buprenorphine HCl (BELBUCA) 150 MCG FILM Place 1 Film inside cheek every 12 (twelve) hours. 60 each 5   carvedilol (COREG) 6.25 MG tablet Take 6.25 mg by mouth 2 (two) times daily with a meal.     Cholecalciferol (VITAMIN D3) 50 MCG (2000 UT) CAPS TAKE 1 CAPSULE BY MOUTH EVERY DAY 90 capsule 1   clobetasol ointment (TEMOVATE) 0.05 % Apply 1 Application topically 2 (two) times daily. Apply to aa's rash BID up to two weeks. Avoid applying to face, groin, and axilla. Use as directed. Long-term use can cause thinning of the skin. 30 g 0   fluticasone (FLONASE) 50 MCG/ACT nasal spray USE 1 SPRAY NASALLY EVERY DAY 32 g 3   Lancets Misc. (ACCU-CHEK FASTCLIX LANCET) KIT USE AS DIRECTED 1 kit 3   mexiletine (MEXITIL) 150 MG capsule Take 150 mg by mouth 3 (three) times daily.  QUEtiapine (SEROQUEL) 25 MG tablet Take 25 mg by mouth as needed.     valACYclovir (VALTREX) 500 MG tablet Take 1 tablet (500 mg total) by mouth daily. 90 tablet 0   Vitamin D, Ergocalciferol, (DRISDOL) 1.25 MG (50000 UNIT) CAPS capsule Take 1 capsule (50,000 Units total) by mouth once a week. 12 capsule 1   No current facility-administered medications for this visit.    Allergies:   Phenergan [promethazine hcl], Codeine, Cortisone, Lidocaine, and Oxycodone    Social History:   reports that she has never smoked. She has never used smokeless tobacco. She reports that she does not drink alcohol and does not use drugs.   Family History:  family history includes Breast cancer in her maternal aunt; Diabetes in her father; Hyperlipidemia in her  father; Hypertension in her father.    ROS:     Review of Systems  Constitutional: Negative.   HENT: Negative.    Eyes: Negative.   Respiratory: Negative.    Cardiovascular:  Positive for chest pain. Negative for syncope and PND.  Gastrointestinal: Negative.  Negative for nausea.  Genitourinary: Negative.   Musculoskeletal: Negative.   Skin: Negative.   Neurological:  Positive for dizziness.  Endo/Heme/Allergies: Negative.   Psychiatric/Behavioral: Negative.    All other systems reviewed and are negative.     All other systems are reviewed and negative.    PHYSICAL EXAM: VS:  BP (!) 160/85   Pulse 65   Ht 5\' 8"  (1.727 m)   Wt 237 lb (107.5 kg)   SpO2 98%   BMI 36.04 kg/m  , BMI Body mass index is 36.04 kg/m. Last weight:  Wt Readings from Last 3 Encounters:  04/02/23 237 lb (107.5 kg)  03/01/23 233 lb (105.7 kg)  03/01/23 230 lb (104.3 kg)     Physical Exam Constitutional:      Appearance: Normal appearance.  Cardiovascular:     Rate and Rhythm: Normal rate and regular rhythm.     Heart sounds: Normal heart sounds.  Pulmonary:     Effort: Pulmonary effort is normal.     Breath sounds: Normal breath sounds.  Musculoskeletal:     Right lower leg: No edema.     Left lower leg: No edema.  Neurological:     Mental Status: She is alert.       EKG: NSR no acte changes 62/min  Recent Labs: 02/25/2023: ALT 11; BUN 12; Creatinine, Ser 0.64; Potassium 5.3; Sodium 145    Lipid Panel    Component Value Date/Time   CHOL 222 (H) 02/25/2023 0936   TRIG 184 (H) 02/25/2023 0936   HDL 57 02/25/2023 0936   CHOLHDL 3.9 02/25/2023 0936   LDLCALC 132 (H) 02/25/2023 0936      Other studies Reviewed: Additional studies/ records that were reviewed today include:  Review of the above records demonstrates:       No data to display            ASSESSMENT AND PLAN:    ICD-10-CM   1. Other chest pain  R07.89 MYOCARDIAL PERFUSION IMAGING    PCV  ECHOCARDIOGRAM COMPLETE    aspirin EC 81 MG tablet    isosorbide mononitrate (IMDUR) 30 MG 24 hr tablet   Admitted with chest pain, troponins, 16, 14, 15, with cute EKG changes, advise echo, stress test    2. /  I10 MYOCARDIAL PERFUSION IMAGING    PCV ECHOCARDIOGRAM COMPLETE    aspirin EC 81 MG tablet  isosorbide mononitrate (IMDUR) 30 MG 24 hr tablet   Repeat BP 120/80    3. Myotonic muscular dystrophy (HCC)  G71.11 MYOCARDIAL PERFUSION IMAGING    PCV ECHOCARDIOGRAM COMPLETE    aspirin EC 81 MG tablet    isosorbide mononitrate (IMDUR) 30 MG 24 hr tablet    4. Mixed hyperlipidemia  E78.2 MYOCARDIAL PERFUSION IMAGING    PCV ECHOCARDIOGRAM COMPLETE    aspirin EC 81 MG tablet    isosorbide mononitrate (IMDUR) 30 MG 24 hr tablet    5. SOB (shortness of breath)  R06.02 MYOCARDIAL PERFUSION IMAGING    PCV ECHOCARDIOGRAM COMPLETE    aspirin EC 81 MG tablet    isosorbide mononitrate (IMDUR) 30 MG 24 hr tablet    6. Dizziness  R42 MYOCARDIAL PERFUSION IMAGING    PCV ECHOCARDIOGRAM COMPLETE    aspirin EC 81 MG tablet    isosorbide mononitrate (IMDUR) 30 MG 24 hr tablet       Problem List Items Addressed This Visit       Cardiovascular and Mediastinum   Essential hypertension   Relevant Medications   aspirin EC 81 MG tablet   isosorbide mononitrate (IMDUR) 30 MG 24 hr tablet   Other Relevant Orders   MYOCARDIAL PERFUSION IMAGING   PCV ECHOCARDIOGRAM COMPLETE     Musculoskeletal and Integument   Myotonic muscular dystrophy (HCC)   Relevant Medications   aspirin EC 81 MG tablet   isosorbide mononitrate (IMDUR) 30 MG 24 hr tablet   Other Relevant Orders   MYOCARDIAL PERFUSION IMAGING   PCV ECHOCARDIOGRAM COMPLETE     Other   Mixed hyperlipidemia   Relevant Medications   aspirin EC 81 MG tablet   isosorbide mononitrate (IMDUR) 30 MG 24 hr tablet   Other Relevant Orders   MYOCARDIAL PERFUSION IMAGING   PCV ECHOCARDIOGRAM COMPLETE   Other Visit Diagnoses     Other  chest pain    -  Primary   Admitted with chest pain, troponins, 16, 14, 15, with cute EKG changes, advise echo, stress test   Relevant Medications   aspirin EC 81 MG tablet   isosorbide mononitrate (IMDUR) 30 MG 24 hr tablet   Other Relevant Orders   MYOCARDIAL PERFUSION IMAGING   PCV ECHOCARDIOGRAM COMPLETE   SOB (shortness of breath)       Relevant Medications   aspirin EC 81 MG tablet   isosorbide mononitrate (IMDUR) 30 MG 24 hr tablet   Other Relevant Orders   MYOCARDIAL PERFUSION IMAGING   PCV ECHOCARDIOGRAM COMPLETE   Dizziness       Relevant Medications   aspirin EC 81 MG tablet   isosorbide mononitrate (IMDUR) 30 MG 24 hr tablet   Other Relevant Orders   MYOCARDIAL PERFUSION IMAGING   PCV ECHOCARDIOGRAM COMPLETE          Disposition:   Return in about 2 weeks (around 04/16/2023) for echo, stress test and f/u.    Total time spent: 50 minutes  Signed,  Adrian Blackwater, MD  04/02/2023 9:31 AM    [facility]

## 2023-04-05 ENCOUNTER — Encounter: Payer: Self-pay | Admitting: Cardiovascular Disease

## 2023-04-06 ENCOUNTER — Other Ambulatory Visit: Payer: Self-pay | Admitting: Internal Medicine

## 2023-04-14 ENCOUNTER — Ambulatory Visit: Payer: Medicare PPO | Admitting: Student in an Organized Health Care Education/Training Program

## 2023-04-14 DIAGNOSIS — M5030 Other cervical disc degeneration, unspecified cervical region: Secondary | ICD-10-CM | POA: Diagnosis not present

## 2023-04-14 DIAGNOSIS — S43429A Sprain of unspecified rotator cuff capsule, initial encounter: Secondary | ICD-10-CM | POA: Diagnosis not present

## 2023-04-14 DIAGNOSIS — F4310 Post-traumatic stress disorder, unspecified: Secondary | ICD-10-CM | POA: Diagnosis not present

## 2023-04-14 DIAGNOSIS — F0632 Mood disorder due to known physiological condition with major depressive-like episode: Secondary | ICD-10-CM | POA: Diagnosis not present

## 2023-04-14 DIAGNOSIS — F411 Generalized anxiety disorder: Secondary | ICD-10-CM | POA: Diagnosis not present

## 2023-04-14 DIAGNOSIS — M47816 Spondylosis without myelopathy or radiculopathy, lumbar region: Secondary | ICD-10-CM | POA: Diagnosis not present

## 2023-04-16 ENCOUNTER — Ambulatory Visit: Payer: Medicare PPO

## 2023-04-16 DIAGNOSIS — R0789 Other chest pain: Secondary | ICD-10-CM

## 2023-04-16 DIAGNOSIS — I351 Nonrheumatic aortic (valve) insufficiency: Secondary | ICD-10-CM | POA: Diagnosis not present

## 2023-04-16 DIAGNOSIS — G7111 Myotonic muscular dystrophy: Secondary | ICD-10-CM

## 2023-04-16 DIAGNOSIS — R42 Dizziness and giddiness: Secondary | ICD-10-CM

## 2023-04-16 DIAGNOSIS — I10 Essential (primary) hypertension: Secondary | ICD-10-CM

## 2023-04-16 DIAGNOSIS — E782 Mixed hyperlipidemia: Secondary | ICD-10-CM

## 2023-04-16 DIAGNOSIS — R0602 Shortness of breath: Secondary | ICD-10-CM

## 2023-04-20 ENCOUNTER — Other Ambulatory Visit: Payer: Self-pay | Admitting: Internal Medicine

## 2023-04-20 ENCOUNTER — Ambulatory Visit (INDEPENDENT_AMBULATORY_CARE_PROVIDER_SITE_OTHER): Payer: Medicare PPO

## 2023-04-20 DIAGNOSIS — R0602 Shortness of breath: Secondary | ICD-10-CM

## 2023-04-20 DIAGNOSIS — R0789 Other chest pain: Secondary | ICD-10-CM | POA: Diagnosis not present

## 2023-04-20 DIAGNOSIS — R42 Dizziness and giddiness: Secondary | ICD-10-CM

## 2023-04-20 DIAGNOSIS — I10 Essential (primary) hypertension: Secondary | ICD-10-CM

## 2023-04-20 DIAGNOSIS — E782 Mixed hyperlipidemia: Secondary | ICD-10-CM

## 2023-04-20 DIAGNOSIS — G7111 Myotonic muscular dystrophy: Secondary | ICD-10-CM

## 2023-04-20 MED ORDER — TECHNETIUM TC 99M SESTAMIBI GENERIC - CARDIOLITE
33.0000 | Freq: Once | INTRAVENOUS | Status: AC | PRN
  Administered 2023-04-20: 33 via INTRAVENOUS

## 2023-04-20 MED ORDER — TECHNETIUM TC 99M SESTAMIBI GENERIC - CARDIOLITE
11.1000 | Freq: Once | INTRAVENOUS | Status: AC | PRN
  Administered 2023-04-20: 11.1 via INTRAVENOUS

## 2023-04-21 DIAGNOSIS — F0632 Mood disorder due to known physiological condition with major depressive-like episode: Secondary | ICD-10-CM | POA: Diagnosis not present

## 2023-04-21 DIAGNOSIS — F4310 Post-traumatic stress disorder, unspecified: Secondary | ICD-10-CM | POA: Diagnosis not present

## 2023-04-21 DIAGNOSIS — F411 Generalized anxiety disorder: Secondary | ICD-10-CM | POA: Diagnosis not present

## 2023-04-23 ENCOUNTER — Ambulatory Visit (INDEPENDENT_AMBULATORY_CARE_PROVIDER_SITE_OTHER): Payer: Medicare PPO | Admitting: Cardiovascular Disease

## 2023-04-23 ENCOUNTER — Encounter: Payer: Self-pay | Admitting: Cardiovascular Disease

## 2023-04-23 VITALS — BP 130/80 | HR 78 | Ht 68.0 in | Wt 237.4 lb

## 2023-04-23 DIAGNOSIS — E119 Type 2 diabetes mellitus without complications: Secondary | ICD-10-CM

## 2023-04-23 DIAGNOSIS — R42 Dizziness and giddiness: Secondary | ICD-10-CM

## 2023-04-23 DIAGNOSIS — R0602 Shortness of breath: Secondary | ICD-10-CM | POA: Diagnosis not present

## 2023-04-23 DIAGNOSIS — R0789 Other chest pain: Secondary | ICD-10-CM

## 2023-04-23 DIAGNOSIS — I340 Nonrheumatic mitral (valve) insufficiency: Secondary | ICD-10-CM | POA: Diagnosis not present

## 2023-04-23 DIAGNOSIS — E782 Mixed hyperlipidemia: Secondary | ICD-10-CM | POA: Diagnosis not present

## 2023-04-23 MED ORDER — REPATHA SURECLICK 140 MG/ML ~~LOC~~ SOAJ
140.0000 mg | SUBCUTANEOUS | 2 refills | Status: DC
Start: 2023-04-23 — End: 2023-08-16

## 2023-04-23 NOTE — Progress Notes (Signed)
Cardiology Office Note   Date:  04/23/2023   ID:  Avel Peace, DOB 04-Nov-1970, MRN [id number]  PCP:  Sherron Monday, MD  Cardiologist:  Adrian Blackwater, MD      History of Present Illness: Vickie Stevens is a 52 y.o. 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[facility])      Past Surgical History:  Procedure Laterality Date   CATARACT EXTRACTION, BILATERAL     COLONOSCOPY WITH PROPOFOL N/A 06/08/2018   Procedure: COLONOSCOPY WITH PROPOFOL;  Surgeon: Christena Deem, MD;  Location: [facility] ENDOSCOPY;  Service: Endoscopy;  Laterality: N/A;   ENDOMETRIAL ABLATION  01-2011   ESOPHAGOGASTRODUODENOSCOPY (EGD) WITH PROPOFOL N/A 06/08/2018   Procedure: ESOPHAGOGASTRODUODENOSCOPY (EGD) WITH PROPOFOL;  Surgeon: Christena Deem, MD;  Location: [facility] ENDOSCOPY;  Service: Endoscopy;  Laterality: N/A;   SCS 8/ 2021     SHOULDER SURGERY Left 07-1999, 07-2011   x 4     Current Outpatient Medications  Medication Sig Dispense Refill   Evolocumab (REPATHA SURECLICK) 140 MG/ML SOAJ Inject 140 mg into the skin every 14 (fourteen) days. 2 mL 2   Accu-Chek FastClix Lancets MISC TEST TWO TIMES DAILY 204 each 3   ACCU-CHEK GUIDE test strip TEST BLOOD SUGAR TWICE DAILY 200 strip 3   Alcohol Swabs (DROPSAFE ALCOHOL PREP) 70 % PADS USE TWO TIMES DAILY 200 each 3   aspirin EC 81 MG tablet Take 1 tablet (81 mg total) by mouth daily. Swallow  whole. 90 tablet 3   atorvastatin (LIPITOR) 20 MG tablet TAKE 1 TABLET AT BEDTIME 90 tablet 3   Blood Glucose Monitoring Suppl (ACCU-CHEK GUIDE) w/Device KIT USE AS DIRECTED 1 kit 3   Buprenorphine HCl (BELBUCA) 150 MCG FILM Place 1 Film inside cheek every 12 (twelve) hours. 60 each 5   carvedilol (COREG) 6.25 MG tablet Take 6.25 mg by mouth 2 (two) times daily with a meal.     Cholecalciferol (VITAMIN D3) 50 MCG (2000 UT) CAPS TAKE 1 CAPSULE BY MOUTH EVERY DAY 90 capsule 1   clobetasol ointment (TEMOVATE) 0.05 % Apply 1 Application topically 2 (two) times daily. Apply to aa's rash BID up to two weeks. Avoid applying to face, groin, and axilla. Use as directed. Long-term use can cause thinning of the skin. 30 g 0   fluticasone (FLONASE) 50 MCG/ACT nasal spray USE 1 SPRAY NASALLY EVERY DAY 32 g 3   Glucagon (GVOKE HYPOPEN 2-PACK) 0.5 MG/0.1ML SOAJ USE AS DIRECTED, INJECT ONE VIAL FOR ANY EPISODE OF LOW BLOOD SUGAR BELOW 54 0.2 mL 3   isosorbide mononitrate (IMDUR) 30 MG 24 hr tablet Take 1 tablet (30 mg total) by mouth daily. 30 tablet 1   Lancets Misc. (ACCU-CHEK FASTCLIX LANCET) KIT USE AS DIRECTED 1 kit 3   mexiletine (MEXITIL)  150 MG capsule Take 150 mg by mouth 3 (three) times daily.     QUEtiapine (SEROQUEL) 25 MG tablet Take 25 mg by mouth as needed.     triamcinolone cream (KENALOG) 0.1 % APPLY TO AFFECTED AREA AS DIRECTED UP TO TWICE DAILY 90 g 3   valACYclovir (VALTREX) 500 MG tablet TAKE 1 TABLET EVERY DAY 90 tablet 3   Vitamin D, Ergocalciferol, (DRISDOL) 1.25 MG (50000 UNIT) CAPS capsule Take 1 capsule (50,000 Units total) by mouth once a week. 12 capsule 1   No current facility-administered medications for this visit.    Allergies:   Phenergan [promethazine hcl], Codeine, Cortisone, Lidocaine, and Oxycodone    Social History:   reports that she has never smoked. She has never used smokeless tobacco. She reports that she does not drink alcohol and does not use drugs.   Family  History:  family history includes Breast cancer in her maternal aunt; Diabetes in her father; Hyperlipidemia in her father; Hypertension in her father.    ROS:     Review of Systems  Constitutional: Negative.   HENT: Negative.    Eyes: Negative.   Respiratory: Negative.    Gastrointestinal: Negative.   Genitourinary: Negative.   Musculoskeletal: Negative.   Skin: Negative.   Neurological: Negative.   Endo/Heme/Allergies: Negative.   Psychiatric/Behavioral: Negative.    All other systems reviewed and are negative.     All other systems are reviewed and negative.    PHYSICAL EXAM: VS:  BP 130/80   Pulse 78   Ht 5\' 8"  (1.727 m)   Wt 237 lb 6.4 oz (107.7 kg)   SpO2 96%   BMI 36.10 kg/m  , BMI Body mass index is 36.1 kg/m. Last weight:  Wt Readings from Last 3 Encounters:  04/23/23 237 lb 6.4 oz (107.7 kg)  04/02/23 237 lb (107.5 kg)  03/01/23 233 lb (105.7 kg)     Physical Exam Constitutional:      Appearance: Normal appearance.  Cardiovascular:     Rate and Rhythm: Normal rate and regular rhythm.     Heart sounds: Normal heart sounds.  Pulmonary:     Effort: Pulmonary effort is normal.     Breath sounds: Normal breath sounds.  Musculoskeletal:     Right lower leg: No edema.     Left lower leg: No edema.  Neurological:     Mental Status: She is alert.       EKG:   Recent Labs: 02/25/2023: ALT 11; BUN 12; Creatinine, Ser 0.64; Potassium 5.3; Sodium 145    Lipid Panel    Component Value Date/Time   CHOL 222 (H) 02/25/2023 0936   TRIG 184 (H) 02/25/2023 0936   HDL 57 02/25/2023 0936   CHOLHDL 3.9 02/25/2023 0936   LDLCALC 132 (H) 02/25/2023 0936      Other studies Reviewed: Additional studies/ records that were reviewed today include:  Review of the above records demonstrates:       No data to display            ASSESSMENT AND PLAN:    ICD-10-CM   1. Chest pain, non-cardiac  R07.89 Evolocumab (REPATHA SURECLICK) 140 MG/ML SOAJ    Stress test normal, mild MR echo. Probably musckuloskeletal pain    2. Mixed hyperlipidemia  E78.2 Evolocumab (REPATHA SURECLICK) 140 MG/ML SOAJ   Order repatha as LDL very high as unable to take satatins and has musckuloskeletal dystrrophy    3. SOB (shortness of breath)  R06.02  Evolocumab (REPATHA SURECLICK) 140 MG/ML SOAJ    4. Dizziness  R42 Evolocumab (REPATHA SURECLICK) 140 MG/ML SOAJ    5. Type 2 diabetes mellitus without complication, without long-term current use of insulin (HCC)  E11.9 Evolocumab (REPATHA SURECLICK) 140 MG/ML SOAJ    6. Nonrheumatic mitral valve regurgitation  I34.0 Evolocumab (REPATHA SURECLICK) 140 MG/ML SOAJ       Problem List Items Addressed This Visit       Endocrine   Type 2 diabetes mellitus (HCC)   Relevant Medications   Evolocumab (REPATHA SURECLICK) 140 MG/ML SOAJ     Other   Mixed hyperlipidemia   Relevant Medications   Evolocumab (REPATHA SURECLICK) 140 MG/ML SOAJ   Other Visit Diagnoses     Chest pain, non-cardiac    -  Primary   Stress test normal, mild MR echo. Probably musckuloskeletal pain   Relevant Medications   Evolocumab (REPATHA SURECLICK) 140 MG/ML SOAJ   SOB (shortness of breath)       Relevant Medications   Evolocumab (REPATHA SURECLICK) 140 MG/ML SOAJ   Dizziness       Relevant Medications   Evolocumab (REPATHA SURECLICK) 140 MG/ML SOAJ   Nonrheumatic mitral valve regurgitation       Relevant Medications   Evolocumab (REPATHA SURECLICK) 140 MG/ML SOAJ          Disposition:   Return in about 3 months (around 07/24/2023).    Total time spent: 30 minutes  Signed,  Adrian Blackwater, MD  04/23/2023 10:42 AM    [facility]

## 2023-04-25 ENCOUNTER — Other Ambulatory Visit: Payer: Self-pay | Admitting: Cardiovascular Disease

## 2023-04-25 DIAGNOSIS — R42 Dizziness and giddiness: Secondary | ICD-10-CM

## 2023-04-25 DIAGNOSIS — E782 Mixed hyperlipidemia: Secondary | ICD-10-CM

## 2023-04-25 DIAGNOSIS — I10 Essential (primary) hypertension: Secondary | ICD-10-CM

## 2023-04-25 DIAGNOSIS — R0602 Shortness of breath: Secondary | ICD-10-CM

## 2023-04-25 DIAGNOSIS — G7111 Myotonic muscular dystrophy: Secondary | ICD-10-CM

## 2023-04-25 DIAGNOSIS — R0789 Other chest pain: Secondary | ICD-10-CM

## 2023-04-27 ENCOUNTER — Other Ambulatory Visit: Payer: Self-pay

## 2023-04-28 DIAGNOSIS — F411 Generalized anxiety disorder: Secondary | ICD-10-CM | POA: Diagnosis not present

## 2023-04-28 DIAGNOSIS — F0632 Mood disorder due to known physiological condition with major depressive-like episode: Secondary | ICD-10-CM | POA: Diagnosis not present

## 2023-04-28 DIAGNOSIS — F4310 Post-traumatic stress disorder, unspecified: Secondary | ICD-10-CM | POA: Diagnosis not present

## 2023-04-29 ENCOUNTER — Other Ambulatory Visit: Payer: Self-pay | Admitting: Medical Genetics

## 2023-04-29 DIAGNOSIS — Z006 Encounter for examination for normal comparison and control in clinical research program: Secondary | ICD-10-CM

## 2023-05-02 ENCOUNTER — Other Ambulatory Visit: Payer: Medicare PPO

## 2023-05-03 ENCOUNTER — Encounter: Payer: Self-pay | Admitting: Internal Medicine

## 2023-05-04 ENCOUNTER — Other Ambulatory Visit
Admission: RE | Admit: 2023-05-04 | Discharge: 2023-05-04 | Disposition: A | Discharge status: Home / Self Care | Payer: Medicare PPO | Source: Ambulatory Visit | Attending: Medical Genetics | Admitting: Medical Genetics

## 2023-05-04 ENCOUNTER — Other Ambulatory Visit: Payer: Self-pay

## 2023-05-04 DIAGNOSIS — Z006 Encounter for examination for normal comparison and control in clinical research program: Secondary | ICD-10-CM | POA: Insufficient documentation

## 2023-05-07 ENCOUNTER — Encounter: Payer: Self-pay | Admitting: Student in an Organized Health Care Education/Training Program

## 2023-05-09 DIAGNOSIS — E119 Type 2 diabetes mellitus without complications: Secondary | ICD-10-CM | POA: Diagnosis not present

## 2023-05-10 ENCOUNTER — Encounter: Payer: Self-pay | Admitting: *Deleted

## 2023-05-10 NOTE — Telephone Encounter (Signed)
Ok,  Just let us know what we need to do.

## 2023-05-11 LAB — HELIX MOLECULAR SCREEN: Genetic Analysis Overall Interpretation: NEGATIVE

## 2023-05-11 LAB — GENECONNECT MOLECULAR SCREEN

## 2023-05-12 ENCOUNTER — Ambulatory Visit: Payer: Medicare PPO | Admitting: Anesthesiology

## 2023-05-12 ENCOUNTER — Other Ambulatory Visit: Payer: Self-pay

## 2023-05-12 ENCOUNTER — Encounter: Payer: Self-pay | Admitting: Internal Medicine

## 2023-05-12 ENCOUNTER — Encounter: Payer: Self-pay | Admitting: *Deleted

## 2023-05-12 ENCOUNTER — Encounter: Payer: Self-pay | Admitting: Student in an Organized Health Care Education/Training Program

## 2023-05-12 ENCOUNTER — Ambulatory Visit
Admission: RE | Admit: 2023-05-12 | Discharge: 2023-05-12 | Disposition: A | Discharge status: Home / Self Care | Payer: Medicare PPO | Attending: Internal Medicine | Admitting: Internal Medicine

## 2023-05-12 ENCOUNTER — Encounter
Admission: RE | Disposition: A | Discharge status: Home / Self Care | Payer: Self-pay | Source: Home / Self Care | Attending: Internal Medicine

## 2023-05-12 DIAGNOSIS — Z79891 Long term (current) use of opiate analgesic: Secondary | ICD-10-CM | POA: Diagnosis not present

## 2023-05-12 DIAGNOSIS — E119 Type 2 diabetes mellitus without complications: Secondary | ICD-10-CM | POA: Diagnosis not present

## 2023-05-12 DIAGNOSIS — G7111 Myotonic muscular dystrophy: Secondary | ICD-10-CM | POA: Insufficient documentation

## 2023-05-12 DIAGNOSIS — E669 Obesity, unspecified: Secondary | ICD-10-CM | POA: Insufficient documentation

## 2023-05-12 DIAGNOSIS — E282 Polycystic ovarian syndrome: Secondary | ICD-10-CM | POA: Insufficient documentation

## 2023-05-12 DIAGNOSIS — I10 Essential (primary) hypertension: Secondary | ICD-10-CM | POA: Diagnosis not present

## 2023-05-12 DIAGNOSIS — R1310 Dysphagia, unspecified: Secondary | ICD-10-CM | POA: Diagnosis not present

## 2023-05-12 DIAGNOSIS — Z6836 Body mass index (BMI) 36.0-36.9, adult: Secondary | ICD-10-CM | POA: Insufficient documentation

## 2023-05-12 DIAGNOSIS — F319 Bipolar disorder, unspecified: Secondary | ICD-10-CM | POA: Insufficient documentation

## 2023-05-12 DIAGNOSIS — K5090 Crohn's disease, unspecified, without complications: Secondary | ICD-10-CM | POA: Diagnosis not present

## 2023-05-12 DIAGNOSIS — G8929 Other chronic pain: Secondary | ICD-10-CM | POA: Diagnosis not present

## 2023-05-12 DIAGNOSIS — M1990 Unspecified osteoarthritis, unspecified site: Secondary | ICD-10-CM | POA: Insufficient documentation

## 2023-05-12 DIAGNOSIS — G4730 Sleep apnea, unspecified: Secondary | ICD-10-CM | POA: Diagnosis not present

## 2023-05-12 DIAGNOSIS — K219 Gastro-esophageal reflux disease without esophagitis: Secondary | ICD-10-CM | POA: Insufficient documentation

## 2023-05-12 DIAGNOSIS — K3189 Other diseases of stomach and duodenum: Secondary | ICD-10-CM | POA: Diagnosis not present

## 2023-05-12 HISTORY — DX: Spinal stenosis, lumbar region without neurogenic claudication: M48.061

## 2023-05-12 HISTORY — DX: Myotonic muscular dystrophy: G71.11

## 2023-05-12 HISTORY — DX: Hyperkalemia: E87.5

## 2023-05-12 HISTORY — PX: ESOPHAGOGASTRODUODENOSCOPY (EGD) WITH PROPOFOL: SHX5813

## 2023-05-12 SURGERY — ESOPHAGOGASTRODUODENOSCOPY (EGD) WITH PROPOFOL
Anesthesia: General

## 2023-05-12 MED ORDER — PROPOFOL 10 MG/ML IV BOLUS
INTRAVENOUS | Status: AC
  Filled 2023-05-12: qty 40

## 2023-05-12 MED ORDER — LIDOCAINE HCL (PF) 2 % IJ SOLN
INTRAMUSCULAR | Status: AC
  Filled 2023-05-12: qty 5

## 2023-05-12 MED ORDER — DEXMEDETOMIDINE HCL IN NACL 80 MCG/20ML IV SOLN
INTRAVENOUS | Status: AC
  Filled 2023-05-12: qty 20

## 2023-05-12 MED ORDER — DEXMEDETOMIDINE HCL IN NACL 80 MCG/20ML IV SOLN
INTRAVENOUS | Status: DC | PRN
  Administered 2023-05-12: 8 ug via INTRAVENOUS

## 2023-05-12 MED ORDER — SODIUM CHLORIDE 0.9 % IV SOLN: INTRAVENOUS | Status: DC

## 2023-05-12 MED ORDER — PROPOFOL 10 MG/ML IV BOLUS
INTRAVENOUS | Status: DC | PRN
  Administered 2023-05-12: 20 mg via INTRAVENOUS
  Administered 2023-05-12: 30 mg via INTRAVENOUS
  Administered 2023-05-12: 100 mg via INTRAVENOUS
  Administered 2023-05-12: 20 mg via INTRAVENOUS
  Administered 2023-05-12 (×2): 30 mg via INTRAVENOUS

## 2023-05-12 NOTE — Anesthesia Preprocedure Evaluation (Addendum)
Anesthesia Evaluation  Patient identified by MRN, date of birth, ID band Patient awake    Reviewed: Allergy & Precautions, NPO status , Patient's Chart, lab work & pertinent test results  History of Anesthesia Complications Negative for: history of anesthetic complications  Airway Mallampati: III   Neck ROM: Full    Dental  (+) Caps   Pulmonary sleep apnea    Pulmonary exam normal breath sounds clear to auscultation       Cardiovascular hypertension, Normal cardiovascular exam Rhythm:Regular Rate:Normal  ECG 04/02/23: NSR with sinus arrhythmia, low voltage  Echo 05/08/22:    NORMAL LEFT VENTRICULAR SYSTOLIC FUNCTION WITH MILD LVH    NORMAL LA PRESSURES WITH NORMAL DIASTOLIC FUNCTION    NORMAL RIGHT VENTRICULAR SYSTOLIC FUNCTION    VALVULAR REGURGITATION: TRIVIAL PR, TRIVIAL TR    NO VALVULAR STENOSIS    POOR SOUND TRANSMISSION DESPITE THE USE OF DEFINITY CONTRAST. LVEF APPEARS    GROSSLY NORMAL FROM A PARASTERNAL WINDOW    NO PRIOR STUDY FOR COMPARISON    Neuro/Psych  Headaches PSYCHIATRIC DISORDERS (ADHD) Anxiety Depression Bipolar Disorder   Chronic pain on buprenorphine  Neuromuscular disease (myotonic dystrophy)    GI/Hepatic Crohn disease   Endo/Other  diabetes, Type 2  Obesity; PCOS  Renal/GU negative Renal ROS     Musculoskeletal  (+) Arthritis ,    Abdominal   Peds  Hematology negative hematology ROS (+)   Anesthesia Other Findings   Reproductive/Obstetrics                             Anesthesia Physical Anesthesia Plan  ASA: 3  Anesthesia Plan: General   Post-op Pain Management:    Induction: Intravenous  PONV Risk Score and Plan: 3 and Propofol infusion, TIVA and Treatment may vary due to age or medical condition  Airway Management Planned: Natural Airway  Additional Equipment:   Intra-op Plan:   Post-operative Plan:   Informed Consent: I have reviewed the  patients History and Physical, chart, labs and discussed the procedure including the risks, benefits and alternatives for the proposed anesthesia with the patient or authorized representative who has indicated his/her understanding and acceptance.       Plan Discussed with: CRNA  Anesthesia Plan Comments: (LMA/GETA backup discussed.  Patient consented for risks of anesthesia including but not limited to:  - adverse reactions to medications - damage to eyes, teeth, lips or other oral mucosa - nerve damage due to positioning  - sore throat or hoarseness - damage to heart, brain, nerves, lungs, other parts of body or loss of life  Informed patient about role of CRNA in peri- and intra-operative care.  Patient voiced understanding.)        Anesthesia Quick Evaluation

## 2023-05-12 NOTE — Anesthesia Postprocedure Evaluation (Signed)
Anesthesia Post Note  Patient: Vickie Stevens  Procedure(s) Performed: ESOPHAGOGASTRODUODENOSCOPY (EGD) WITH PROPOFOL ESOPHAGEAL DILITATION  Patient location during evaluation: PACU Anesthesia Type: General Level of consciousness: awake and alert, oriented and patient cooperative Pain management: pain level controlled Vital Signs Assessment: post-procedure vital signs reviewed and stable Respiratory status: spontaneous breathing, nonlabored ventilation and respiratory function stable Cardiovascular status: blood pressure returned to baseline and stable Postop Assessment: adequate PO intake Anesthetic complications: no   No notable events documented.   Last Vitals:  Vitals:   05/12/23 0928 05/12/23 0938  BP: 111/88 121/87  Pulse:    Resp: (!) 21 15  Temp:    SpO2: 99% 100%    Last Pain:  Vitals:   05/12/23 0938  TempSrc:   PainSc: 0-No pain                 Reed Breech

## 2023-05-12 NOTE — H&P (Signed)
Outpatient short stay form Pre-procedure 05/12/2023 8:46 AM Vickie Stevens [name], M.D.  Primary Physician: [name], M.D.  Reason for visit:  GERD, dysphagia  History of present illness:  GERD-has used Prilosec in the past. Has had EGD in 2019 with esophagitis. She did not have EGD at time of her last colonoscopy. Can restart PPI if feels it is needed. Diet modifications discussed, remain upright after meals, etc. Has intermittent solid food dysphagia and some breakthrough GERD.    Current Facility-Administered Medications:    0.9 %  sodium chloride infusion, , Intravenous, Continuous, [place], Boykin Nearing, MD, Last Rate: 20 mL/hr at 05/12/23 0813, Continued from Pre-op at 05/12/23 0813  Medications Prior to Admission  Medication Sig Dispense Refill Last Dose   aspirin EC 81 MG tablet Take 1 tablet (81 mg total) by mouth daily. Swallow whole. 90 tablet 3 05/11/2023   atorvastatin (LIPITOR) 20 MG tablet TAKE 1 TABLET AT BEDTIME 90 tablet 3 05/11/2023   Buprenorphine HCl (BELBUCA) 150 MCG FILM Place 1 Film inside cheek every 12 (twelve) hours. 60 each 5 05/11/2023   carvedilol (COREG) 6.25 MG tablet Take 6.25 mg by mouth 2 (two) times daily with a meal.   05/11/2023   Cholecalciferol (VITAMIN D3) 50 MCG (2000 UT) CAPS TAKE 1 CAPSULE BY MOUTH EVERY DAY 90 capsule 1 Past Week   fluticasone (FLONASE) 50 MCG/ACT nasal spray USE 1 SPRAY NASALLY EVERY DAY 32 g 3 05/11/2023   isosorbide mononitrate (IMDUR) 30 MG 24 hr tablet TAKE 1 TABLET BY MOUTH EVERY DAY 90 tablet 1 05/11/2023   mexiletine (MEXITIL) 150 MG capsule Take 150 mg by mouth 3 (three) times daily.   05/11/2023   QUEtiapine (SEROQUEL) 25 MG tablet Take 25 mg by mouth as needed.   05/11/2023   valACYclovir (VALTREX) 500 MG tablet TAKE 1 TABLET EVERY DAY 90 tablet 3 05/11/2023   Vitamin D, Ergocalciferol, (DRISDOL) 1.25 MG (50000 UNIT) CAPS capsule Take 1 capsule (50,000 Units total) by mouth once a week. 12 capsule 1 05/11/2023   Accu-Chek  FastClix Lancets MISC TEST TWO TIMES DAILY 204 each 3    ACCU-CHEK GUIDE test strip TEST BLOOD SUGAR TWICE DAILY 200 strip 3    Alcohol Swabs (DROPSAFE ALCOHOL PREP) 70 % PADS USE TWO TIMES DAILY 200 each 3    Blood Glucose Monitoring Suppl (ACCU-CHEK GUIDE) w/Device KIT USE AS DIRECTED 1 kit 3    clobetasol ointment (TEMOVATE) 0.05 % Apply 1 Application topically 2 (two) times daily. Apply to aa's rash BID up to two weeks. Avoid applying to face, groin, and axilla. Use as directed. Long-term use can cause thinning of the skin. 30 g 0    Evolocumab (REPATHA SURECLICK) 140 MG/ML SOAJ Inject 140 mg into the skin every 14 (fourteen) days. 2 mL 2    Glucagon (GVOKE HYPOPEN 2-PACK) 0.5 MG/0.1ML SOAJ USE AS DIRECTED, INJECT ONE VIAL FOR ANY EPISODE OF LOW BLOOD SUGAR BELOW 54 0.2 mL 3    Lancets Misc. (ACCU-CHEK FASTCLIX LANCET) KIT USE AS DIRECTED 1 kit 3    triamcinolone cream (KENALOG) 0.1 % APPLY TO AFFECTED AREA AS DIRECTED UP TO TWICE DAILY 90 g 3      Allergies  Allergen Reactions   Phenergan [Promethazine Hcl] Other (See Comments)    Altered mental status; patient remarked that she ripped out her IV and tore up the hospital room   Codeine Hives   Cortisone Other (See Comments)    Steroid injections may have contributed to  cataracts   Lidocaine Hives    With oral, only   Acetaminophen Rash   Oxycodone Diarrhea and Rash    Pruritic rash, also     Past Medical History:  Diagnosis Date   ADHD (attention deficit hyperactivity disorder)    Biceps tendonitis    Bipolar disorder (HCC)    Chronic back pain    Cluster headache    Crohn's disease (HCC)    DDD (degenerative disc disease), lumbar    Depression    Diabetes mellitus without complication (HCC)    Hypercholesteremia    Hyperkalemia    Hypertension    Lumbar stenosis    Myotonic dystrophy (HCC)    PCOS (polycystic ovarian syndrome)    Rotator cuff tendinitis    Sleep apnea    Tuberous sclerosis (HCC)     Review of  systems:  Otherwise negative.    Physical Exam  Gen: Alert, oriented. Appears stated age.  HEENT: [place]/AT. PERRLA. Lungs: CTA, no wheezes. CV: RR nl S1, S2. Abd: soft, benign, no masses. BS+ Ext: No edema. Pulses 2+    Planned procedures: Proceed with EGD.  The patient understands the nature of the planned procedure, indications, risks, alternatives and potential complications including but not limited to bleeding, infection, perforation, damage to internal organs and possible oversedation/side effects from anesthesia. The patient agrees and gives consent to proceed.  Please refer to procedure notes for findings, recommendations and patient disposition/instructions.     Ivette Castronova [name], M.D. Gastroenterology 05/12/2023  8:46 AM

## 2023-05-12 NOTE — Interval H&P Note (Signed)
History and Physical Interval Note:  05/12/2023 8:49 AM  Vickie Stevens  has presented today for surgery, with the diagnosis of Dysphagia GERD.  The various methods of treatment have been discussed with the patient and family. After consideration of risks, benefits and other options for treatment, the patient has consented to  Procedure(s): ESOPHAGOGASTRODUODENOSCOPY (EGD) WITH PROPOFOL (N/A) as a surgical intervention.  The patient's history has been reviewed, patient examined, no change in status, stable for surgery.  I have reviewed the patient's chart and labs.  Questions were answered to the patient's satisfaction.     [place], [place]

## 2023-05-12 NOTE — Op Note (Signed)
[facility] Gastroenterology Patient Name: Vickie Stevens Procedure Date: 05/12/2023 8:05 AM MRN: [id number] Account #: [id number] Date of Birth: 1970-12-05 Admit Type: Outpatient Age: 52 Room: [facility] ENDO ROOM 2 Gender: Female Note Status: Finalized Instrument Name: Upper Endoscope [id number] Procedure:             Upper GI endoscopy Indications:           Dysphagia, Suspected gastro-esophageal reflux disease Providers:             Boykin Nearing. Norma Fredrickson MD, MD Referring MD:          Boykin Nearing. Norma Fredrickson MD, MD (Referring MD), Silas Flood.                         Ellsworth Lennox, MD (Referring MD) Medicines:             Propofol per Anesthesia Complications:         No immediate complications. Estimated blood loss: None. Procedure:             Pre-Anesthesia Assessment:                        - The risks and benefits of the procedure and the                         sedation options and risks were discussed with the                         patient. All questions were answered and informed                         consent was obtained.                        - Patient identification and proposed procedure were                         verified prior to the procedure by the nurse. The                         procedure was verified in the procedure room.                        - ASA Grade Assessment: III - A patient with severe                         systemic disease.                        - After reviewing the risks and benefits, the patient                         was deemed in satisfactory condition to undergo the                         procedure.                        After obtaining informed consent, the endoscope was  passed under direct vision. Throughout the procedure,                         the patient's blood pressure, pulse, and oxygen                         saturations were monitored continuously. The                         Endosonoscope was  introduced through the mouth, and                         advanced to the third part of duodenum. The upper GI                         endoscopy was accomplished without difficulty. The                         patient tolerated the procedure well. Findings:      No endoscopic abnormality was evident in the esophagus to explain the       patient's complaint of dysphagia. It was decided, however, to proceed       with dilation in the distal esophagus. The scope was withdrawn. Dilation       was performed with a Maloney dilator with no resistance at 54 Fr.      J-shaped stomach      No other significant abnormalities were identified in a careful       examination of the stomach.      The examined duodenum was normal. Impression:            - No endoscopic esophageal abnormality to explain                         patient's dysphagia. Esophagus dilated. Dilated.                        - Normal examined duodenum.                        - No specimens collected. Recommendation:        - Patient has a contact number available for                         emergencies. The signs and symptoms of potential                         delayed complications were discussed with the patient.                         Return to normal activities tomorrow. Written                         discharge instructions were provided to the patient.                        - Resume previous diet.                        - Continue present medications.                        -  Monitor results to esophageal dilation                        - Follow up with Tawni Pummel, PA-C at [facility] Gastroenterology. [phone].                        - Telephone GI office to schedule appointment in 3                         months. Procedure Code(s):     --- Professional ---                        [phone], Esophagogastroduodenoscopy, flexible,                         transoral; diagnostic, including  collection of                         specimen(s) by brushing or washing, when performed                         (separate procedure)                        43450, Dilation of esophagus, by unguided sound or                         bougie, single or multiple passes Diagnosis Code(s):     --- Professional ---                        R13.10, Dysphagia, unspecified CPT copyright 2022 American Medical Association. All rights reserved. The codes documented in this report are preliminary and upon coder review may  be revised to meet current compliance requirements. Stanton Kidney MD, MD 05/12/2023 9:14:58 AM This report has been signed electronically. Number of Addenda: 0 Note Initiated On: 05/12/2023 8:05 AM Estimated Blood Loss:  Estimated blood loss: none.      [facility]

## 2023-05-12 NOTE — Transfer of Care (Signed)
Immediate Anesthesia Transfer of Care Note  Patient: Vickie Stevens  Procedure(s) Performed: ESOPHAGOGASTRODUODENOSCOPY (EGD) WITH PROPOFOL ESOPHAGEAL DILITATION  Patient Location: Endoscopy Unit  Anesthesia Type:General  Level of Consciousness: awake and drowsy  Airway & Oxygen Therapy: Patient Spontanous Breathing  Post-op Assessment: Report given to RN and Post -op Vital signs reviewed and stable  Post vital signs: Reviewed and stable  Last Vitals:  Vitals Value Taken Time  BP 113/83 05/12/23 0918  Temp 35.8 0918  Pulse 78 05/12/23 0919  Resp 21 05/12/23 0918  SpO2 100 % 05/12/23 0919  Vitals shown include unfiled device data.  Last Pain:  Vitals:   05/12/23 0918  TempSrc:   PainSc: Asleep         Complications: No notable events documented.

## 2023-05-13 ENCOUNTER — Encounter: Payer: Self-pay | Admitting: Internal Medicine

## 2023-05-14 DIAGNOSIS — R1310 Dysphagia, unspecified: Secondary | ICD-10-CM | POA: Diagnosis not present

## 2023-05-15 DIAGNOSIS — S43429A Sprain of unspecified rotator cuff capsule, initial encounter: Secondary | ICD-10-CM | POA: Diagnosis not present

## 2023-05-15 DIAGNOSIS — M47816 Spondylosis without myelopathy or radiculopathy, lumbar region: Secondary | ICD-10-CM | POA: Diagnosis not present

## 2023-05-15 DIAGNOSIS — M5030 Other cervical disc degeneration, unspecified cervical region: Secondary | ICD-10-CM | POA: Diagnosis not present

## 2023-05-31 ENCOUNTER — Ambulatory Visit: Payer: Medicare PPO | Admitting: Internal Medicine

## 2023-05-31 DIAGNOSIS — F4310 Post-traumatic stress disorder, unspecified: Secondary | ICD-10-CM | POA: Diagnosis not present

## 2023-05-31 DIAGNOSIS — F0632 Mood disorder due to known physiological condition with major depressive-like episode: Secondary | ICD-10-CM | POA: Diagnosis not present

## 2023-05-31 DIAGNOSIS — F411 Generalized anxiety disorder: Secondary | ICD-10-CM | POA: Diagnosis not present

## 2023-06-01 DIAGNOSIS — G4733 Obstructive sleep apnea (adult) (pediatric): Secondary | ICD-10-CM | POA: Diagnosis not present

## 2023-06-02 ENCOUNTER — Telehealth: Payer: Self-pay | Admitting: Student in an Organized Health Care Education/Training Program

## 2023-06-02 NOTE — Telephone Encounter (Signed)
Appeal letter place on Dr Laurey Morale desk.

## 2023-06-02 NOTE — Telephone Encounter (Signed)
Insurance company called stating [place] has been denied and they will send a letter why.

## 2023-06-07 ENCOUNTER — Encounter: Payer: Self-pay | Admitting: *Deleted

## 2023-06-07 ENCOUNTER — Ambulatory Visit
Admission: RE | Admit: 2023-06-07 | Discharge: 2023-06-07 | Disposition: A | Discharge status: Home / Self Care | Payer: Medicare PPO | Source: Ambulatory Visit | Attending: Student in an Organized Health Care Education/Training Program | Admitting: Student in an Organized Health Care Education/Training Program

## 2023-06-07 ENCOUNTER — Encounter: Payer: Self-pay | Admitting: Student in an Organized Health Care Education/Training Program

## 2023-06-07 ENCOUNTER — Ambulatory Visit
Payer: Medicare PPO | Attending: Student in an Organized Health Care Education/Training Program | Admitting: Student in an Organized Health Care Education/Training Program

## 2023-06-07 VITALS — BP 128/91 | HR 69 | Temp 97.3°F | Resp 15 | Ht 68.0 in | Wt 235.0 lb

## 2023-06-07 DIAGNOSIS — M5412 Radiculopathy, cervical region: Secondary | ICD-10-CM | POA: Diagnosis not present

## 2023-06-07 DIAGNOSIS — M542 Cervicalgia: Secondary | ICD-10-CM | POA: Insufficient documentation

## 2023-06-07 DIAGNOSIS — G894 Chronic pain syndrome: Secondary | ICD-10-CM | POA: Diagnosis not present

## 2023-06-07 MED ORDER — LIDOCAINE HCL 2 % IJ SOLN
INTRAMUSCULAR | Status: AC
  Filled 2023-06-07: qty 20

## 2023-06-07 MED ORDER — ROPIVACAINE HCL 2 MG/ML IJ SOLN
1.0000 mL | Freq: Once | INTRAMUSCULAR | Status: AC
  Administered 2023-06-07: 20 mL via EPIDURAL

## 2023-06-07 MED ORDER — IOHEXOL 180 MG/ML  SOLN
INTRAMUSCULAR | Status: AC
  Filled 2023-06-07: qty 20

## 2023-06-07 MED ORDER — SODIUM CHLORIDE (PF) 0.9 % IJ SOLN
INTRAMUSCULAR | Status: AC
  Filled 2023-06-07: qty 10

## 2023-06-07 MED ORDER — DEXAMETHASONE SODIUM PHOSPHATE 10 MG/ML IJ SOLN
10.0000 mg | Freq: Once | INTRAMUSCULAR | Status: AC
  Administered 2023-06-07: 10 mg

## 2023-06-07 MED ORDER — SODIUM CHLORIDE 0.9% FLUSH
1.0000 mL | Freq: Once | INTRAVENOUS | Status: AC
  Administered 2023-06-07: 10 mL

## 2023-06-07 MED ORDER — DEXAMETHASONE SODIUM PHOSPHATE 10 MG/ML IJ SOLN
INTRAMUSCULAR | Status: AC
  Filled 2023-06-07: qty 1

## 2023-06-07 MED ORDER — ROPIVACAINE HCL 2 MG/ML IJ SOLN
INTRAMUSCULAR | Status: AC
  Filled 2023-06-07: qty 20

## 2023-06-07 MED ORDER — LIDOCAINE HCL 2 % IJ SOLN
20.0000 mL | Freq: Once | INTRAMUSCULAR | Status: AC
  Administered 2023-06-07: 400 mg

## 2023-06-07 MED ORDER — IOHEXOL 180 MG/ML  SOLN
10.0000 mL | Freq: Once | INTRAMUSCULAR | Status: AC
  Administered 2023-06-07: 10 mL via EPIDURAL

## 2023-06-07 NOTE — Progress Notes (Signed)
PROVIDER NOTE: Interpretation of information contained herein should be left to medically-trained personnel. Specific patient instructions are provided elsewhere under "Patient Instructions" section of medical record. This document was created in part using STT-dictation technology, any transcriptional errors that may result from this process are unintentional.  Patient: Vickie Stevens Type: Established DOB: 1971/03/05 MRN: [id number] PCP: Sherron Monday, MD  Service: Procedure DOS: 06/07/2023 Setting: Ambulatory Location: Ambulatory outpatient facility Delivery: Face-to-face Provider: Edward Jolly, MD Specialty: Interventional Pain Management Specialty designation: 09 Location: Outpatient facility Ref. Prov.: Sherron Monday, MD       Interventional Therapy   Procedure: Cervical Epidural Steroid injection (CESI) (Interlaminar) #1  Laterality: Midline  Level: C7-T1 Imaging: Fluoroscopy-assisted DOS: 06/07/2023  Performed by: Edward Jolly, MD Anesthesia: Local anesthesia (1-2% Lidocaine) Sedation: No Sedation                         Purpose: Diagnostic/Therapeutic Indications: Cervicalgia, cervical radicular pain, degenerative disc disease, severe enough to impact quality of life or function. 1. Cervical radicular pain   2. Cervicalgia   3. Chronic pain syndrome    NAS-11 score:   Pre-procedure: 6 /10   Post-procedure: 0-No pain/10      Position  Prep  Materials:  Location setting: Procedure suite Position: Prone, on modified reverse trendelenburg to facilitate breathing, with head in head-cradle. Pillows positioned under chest (below chin-level) with cervical spine flexed. Safety Precautions: Patient was assessed for positional comfort and pressure points before starting the procedure. Prepping solution: DuraPrep (Iodine Povacrylex [0.7% available iodine] and Isopropyl Alcohol, 74% w/w) Prep Area: Entire  cervicothoracic region Approach: percutaneous,  paramedial Intended target: Posterior cervical epidural space Materials Procedure:  Tray: Epidural Needle(s): Epidural (Tuohy) Qty: 1 Length: (90mm) 3.5-inch Gauge: 22G   H&P (Pre-op Assessment):  Vickie Stevens is a 52 y.o. (year old), female patient, seen today for interventional treatment. She  has a past surgical history that includes Endometrial ablation (01-2011); Shoulder surgery (Left, 07-1999, 07-2011); Cataract extraction, bilateral; Esophagogastroduodenoscopy (egd) with propofol (N/A, 06/08/2018); Colonoscopy with propofol (N/A, 06/08/2018); SCS 8/ 2021; and Esophagogastroduodenoscopy (egd) with propofol (N/A, 05/12/2023). Vickie Stevens has a current medication list which includes the following prescription(s): accu-chek fastclix lancets, accu-chek guide, dropsafe alcohol prep, accu-chek guide, belbuca, carvedilol, repatha sureclick, fluticasone, gvoke hypopen 2-pack, accu-chek fastclix lancet, lubiprostone, mexiletine, quetiapine, triamcinolone cream, valacyclovir, and vitamin d (ergocalciferol). Her primarily concern today is the Neck Pain (Bilateral )  Initial Vital Signs:  Pulse/HCG Rate: 85  Temp: (!) 97.3 F (36.3 C) Resp: 16 BP: (!) 144/119 (no BP medication this a.m.   currently asymptomatic.) SpO2: 100 %  BMI: Estimated body mass index is 35.73 kg/m as calculated from the following:   Height as of this encounter: 5\' 8"  (1.727 m).   Weight as of this encounter: 235 lb (106.6 kg).  Risk Assessment: Allergies: Reviewed. She is allergic to phenergan [promethazine hcl], codeine, cortisone, lidocaine, acetaminophen, and oxycodone.  Allergy Precautions: None required Coagulopathies: Reviewed. None identified.  Blood-thinner therapy: None at this time Active Infection(s): Reviewed. None identified. Vickie Stevens is afebrile  Site Confirmation: Vickie Stevens was asked to confirm the procedure and laterality before marking the site Procedure checklist: Completed Consent: Before the  procedure and under the influence of no sedative(s), amnesic(s), or anxiolytics, the patient was informed of the treatment options, risks and possible complications. To fulfill our ethical and legal obligations, as recommended by the American Medical Association's Code of Ethics, I have informed the  patient of my clinical impression; the nature and purpose of the treatment or procedure; the risks, benefits, and possible complications of the intervention; the alternatives, including doing nothing; the risk(s) and benefit(s) of the alternative treatment(s) or procedure(s); and the risk(s) and benefit(s) of doing nothing. The patient was provided information about the general risks and possible complications associated with the procedure. These may include, but are not limited to: failure to achieve desired goals, infection, bleeding, organ or nerve damage, allergic reactions, paralysis, and death. In addition, the patient was informed of those risks and complications associated to Spine-related procedures, such as failure to decrease pain; infection (i.e.: Meningitis, epidural or intraspinal abscess); bleeding (i.e.: epidural hematoma, subarachnoid hemorrhage, or any other type of intraspinal or peri-dural bleeding); organ or nerve damage (i.e.: Any type of peripheral nerve, nerve root, or spinal cord injury) with subsequent damage to sensory, motor, and/or autonomic systems, resulting in permanent pain, numbness, and/or weakness of one or several areas of the body; allergic reactions; (i.e.: anaphylactic reaction); and/or death. Furthermore, the patient was informed of those risks and complications associated with the medications. These include, but are not limited to: allergic reactions (i.e.: anaphylactic or anaphylactoid reaction(s)); adrenal axis suppression; blood sugar elevation that in diabetics may result in ketoacidosis or comma; water retention that in patients with history of congestive heart failure  may result in shortness of breath, pulmonary edema, and decompensation with resultant heart failure; weight gain; swelling or edema; medication-induced neural toxicity; particulate matter embolism and blood vessel occlusion with resultant organ, and/or nervous system infarction; and/or aseptic necrosis of one or more joints. Finally, the patient was informed that Medicine is not an exact science; therefore, there is also the possibility of unforeseen or unpredictable risks and/or possible complications that may result in a catastrophic outcome. The patient indicated having understood very clearly. We have given the patient no guarantees and we have made no promises. Enough time was given to the patient to ask questions, all of which were answered to the patient's satisfaction. Ms. Miles has indicated that she wanted to continue with the procedure. Attestation: I, the ordering provider, attest that I have discussed with the patient the benefits, risks, side-effects, alternatives, likelihood of achieving goals, and potential problems during recovery for the procedure that I have provided informed consent. Date  Time: 06/07/2023  9:52 AM   Pre-Procedure Preparation:  Monitoring: As per clinic protocol. Respiration, ETCO2, SpO2, BP, heart rate and rhythm monitor placed and checked for adequate function Safety Precautions: Patient was assessed for positional comfort and pressure points before starting the procedure. Time-out: I initiated and conducted the "Time-out" before starting the procedure, as per protocol. The patient was asked to participate by confirming the accuracy of the "Time Out" information. Verification of the correct person, site, and procedure were performed and confirmed by me, the nursing staff, and the patient. "Time-out" conducted as per Joint Commission's Universal Protocol (UP.01.01.01). Time: 1051 Start Time: 1052 hrs.  Description  Narrative of Procedure:          Rationale  (medical necessity): procedure needed and proper for the diagnosis and/or treatment of the patient's medical symptoms and needs. Start Time: 1052 hrs. Safety Precautions: Aspiration looking for blood return was conducted prior to all injections. At no point did we inject any substances, as a needle was being advanced. No attempts were made at seeking any paresthesias. Safe injection practices and needle disposal techniques used. Medications properly checked for expiration dates. SDV (single dose vial) medications used.  Description of procedure: Protocol guidelines were followed. The patient was assisted into a comfortable position. The target area was identified and the area prepped in the usual manner. Skin & deeper tissues infiltrated with local anesthetic. Appropriate amount of time allowed to pass for local anesthetics to take effect. Using fluoroscopic guidance, the epidural needle was introduced through the skin, ipsilateral to the reported pain, and advanced to the target area. Posterior laminar os was contacted and the needle walked caudad, until the lamina was cleared. The ligamentum flavum was engaged and the epidural space identified using "loss-of-resistance technique" with 2-3 ml of PF-NaCl (0.9% NSS), in a 5cc dedicated LOR syringe. (See "Imaging guidance" below for use of contrast details.) Once proper needle placement was secured, and negative aspiration confirmed, the solution was injected in intermittent fashion, asking for systemic symptoms every 0.5cc. The needles were then removed and the area cleansed, making sure to leave some of the prepping solution back to take advantage of its long term bactericidal properties.  Vitals:   06/07/23 1001 06/07/23 1006 06/07/23 1054  BP: (!) 144/119 116/86 (!) 128/91  Pulse: 85  69  Resp: 16  15  Temp: (!) 97.3 F (36.3 C)    TempSrc: Temporal    SpO2: 100%  98%  Weight: 235 lb (106.6 kg)    Height: 5\' 8"  (1.727 m)       End Time: 1055  hrs.  Imaging Guidance (Spinal):          Type of Imaging Technique: Fluoroscopy Guidance (Spinal) Indication(s): Fluoroscopy guidance for needle placement to enhance accuracy in procedures requiring precise needle localization for targeted delivery of medication in or near specific anatomical locations not easily accessible without such real-time imaging assistance. Exposure Time: Please see nurses notes. Contrast: Before injecting any contrast, we confirmed that the patient did not have an allergy to iodine, shellfish, or radiological contrast. Once satisfactory needle placement was completed at the desired level, radiological contrast was injected. Contrast injected under live fluoroscopy. No contrast complications. See chart for type and volume of contrast used. Fluoroscopic Guidance: I was personally present during the use of fluoroscopy. "Tunnel Vision Technique" used to obtain the best possible view of the target area. Parallax error corrected before commencing the procedure. "Direction-depth-direction" technique used to introduce the needle under continuous pulsed fluoroscopy. Once target was reached, antero-posterior, oblique, and lateral fluoroscopic projection used confirm needle placement in all planes. Images permanently stored in EMR. Interpretation: I personally interpreted the imaging intraoperatively. Adequate needle placement confirmed in multiple planes. Appropriate spread of contrast into desired area was observed. No evidence of afferent or efferent intravascular uptake. No intrathecal or subarachnoid spread observed. Permanent images saved into the patient's record.  Post-operative Assessment:  Post-procedure Vital Signs:  Pulse/HCG Rate: 69  Temp: (!) 97.3 F (36.3 C) Resp: 15 BP: (!) 128/91 SpO2: 98 %  EBL: None  Complications: No immediate post-treatment complications observed by team, or reported by patient.  Note: The patient tolerated the entire procedure well. A  repeat set of vitals were taken after the procedure and the patient was kept under observation following institutional policy, for this type of procedure. Post-procedural neurological assessment was performed, showing return to baseline, prior to discharge. The patient was provided with post-procedure discharge instructions, including a section on how to identify potential problems. Should any problems arise concerning this procedure, the patient was given instructions to immediately contact us, at any time, without hesitation. In any case, we plan to contact the patient  by telephone for a follow-up status report regarding this interventional procedure.  Comments:  No additional relevant information.  Plan of Care (POC)  5 out of 5 strength bilateral upper extremity: Shoulder abduction, elbow flexion, elbow extension, thumb extension. Patient is endorsing increased left hip pain  We have done previous hip injections in the past with limited response.  We discussed Sprint peripheral nerve stimulation of the left femoral nerve in the adductor canal.   Orders:  Orders Placed This Encounter  Procedures   DG PAIN [facility] C-ARM 1-60 MIN NO REPORT    Intraoperative interpretation by procedural physician at [facility].    Standing Status:   Standing    Number of Occurrences:   1    Order Specific Question:   Reason for exam:    Answer:   Assistance in needle guidance and placement for procedures requiring needle placement in or near specific anatomical locations not easily accessible without such assistance.   Chronic Opioid Analgesic:  Belbuca 150 mcg twice daily    Medications ordered for procedure: Meds ordered this encounter  Medications   iohexol (OMNIPAQUE) 180 MG/ML injection 10 mL    Must be Myelogram-compatible. If not available, you may substitute with a water-soluble, non-ionic, hypoallergenic, myelogram-compatible radiological contrast medium.   lidocaine (XYLOCAINE) 2 %  (with pres) injection 400 mg   sodium chloride flush (NS) 0.9 % injection 1 mL   ropivacaine (PF) 2 mg/mL (0.2%) (NAROPIN) injection 1 mL   dexamethasone (DECADRON) injection 10 mg   Medications administered: We administered iohexol, lidocaine, sodium chloride flush, ropivacaine (PF) 2 mg/mL (0.2%), and dexamethasone.  See the medical record for exact dosing, route, and time of administration.  Follow-up plan:   Return in 5 weeks (on 07/12/2023), or PPE VV, for Keep sch. appt.       Status post L4-L5 ESI #1 on 10/16/2019, repeat.  Left L4 Sprint peripheral nerve stimulation 02/19/2020; removed 05/08/2020., right L4 Sprint peripheral nerve stimulation 03/13/2020, lead fracture, replaced 04/15/2020, right removed 06/20/2020.  Right axillary peripheral nerve stimulation 07/29/2020.             Recent Visits Date Type Provider Dept  03/31/23 Office Visit Edward Jolly, MD [facility]  Showing recent visits within past 90 days and meeting all other requirements Today's Visits Date Type Provider Dept  06/07/23 Procedure visit Edward Jolly, MD [facility]  Showing today's visits and meeting all other requirements Future Appointments Date Type Provider Dept  07/14/23 Appointment Edward Jolly, MD [facility]  08/10/23 Appointment Edward Jolly, MD [facility]  Showing future appointments within next 90 days and meeting all other requirements  Disposition: Discharge home  Discharge (Date  Time): 06/07/2023;   hrs.   Primary Care Physician: Sherron Monday, MD Location: [facility] Note by: Edward Jolly, MD (TTS technology used. I apologize for any typographical errors that were not detected and corrected.) Date: 06/07/2023; Time: 12:06 PM  Disclaimer:  Medicine is not an Visual merchandiser. The only guarantee in medicine is that nothing is guaranteed. It is important to note that the decision to proceed with this  intervention was based on the information collected from the patient. The Data and conclusions were drawn from the patient's questionnaire, the interview, and the physical examination. Because the information was provided in large part by the patient, it cannot be guaranteed that it has not been purposely or unconsciously manipulated. Every effort has been made to obtain as  much relevant data as possible for this evaluation. It is important to note that the conclusions that lead to this procedure are derived in large part from the available data. Always take into account that the treatment will also be dependent on availability of resources and existing treatment guidelines, considered by other Pain Management Practitioners as being common knowledge and practice, at the time of the intervention. For Medico-Legal purposes, it is also important to point out that variation in procedural techniques and pharmacological choices are the acceptable norm. The indications, contraindications, technique, and results of the above procedure should only be interpreted and judged by a Board-Certified Interventional Pain Specialist with extensive familiarity and expertise in the same exact procedure and technique.

## 2023-06-07 NOTE — Progress Notes (Signed)
Safety precautions to be maintained throughout the outpatient stay will include: orient to surroundings, keep bed in low position, maintain call bell within reach at all times, provide assistance with transfer out of bed and ambulation.  

## 2023-06-07 NOTE — Patient Instructions (Signed)

## 2023-06-08 ENCOUNTER — Telehealth: Payer: Self-pay

## 2023-06-08 NOTE — Telephone Encounter (Signed)
Post procedure follow up.  LM 

## 2023-06-09 ENCOUNTER — Ambulatory Visit: Payer: Medicare PPO | Admitting: Internal Medicine

## 2023-06-09 DIAGNOSIS — F0632 Mood disorder due to known physiological condition with major depressive-like episode: Secondary | ICD-10-CM | POA: Diagnosis not present

## 2023-06-09 DIAGNOSIS — F411 Generalized anxiety disorder: Secondary | ICD-10-CM | POA: Diagnosis not present

## 2023-06-09 DIAGNOSIS — F4310 Post-traumatic stress disorder, unspecified: Secondary | ICD-10-CM | POA: Diagnosis not present

## 2023-06-11 ENCOUNTER — Ambulatory Visit: Payer: Medicare PPO | Admitting: Cardiovascular Disease

## 2023-06-11 DIAGNOSIS — I10 Essential (primary) hypertension: Secondary | ICD-10-CM | POA: Diagnosis not present

## 2023-06-11 DIAGNOSIS — M62838 Other muscle spasm: Secondary | ICD-10-CM | POA: Diagnosis not present

## 2023-06-11 DIAGNOSIS — R55 Syncope and collapse: Secondary | ICD-10-CM | POA: Diagnosis not present

## 2023-06-11 DIAGNOSIS — Z79899 Other long term (current) drug therapy: Secondary | ICD-10-CM | POA: Diagnosis not present

## 2023-06-11 DIAGNOSIS — G7111 Myotonic muscular dystrophy: Secondary | ICD-10-CM | POA: Diagnosis not present

## 2023-06-14 DIAGNOSIS — M5030 Other cervical disc degeneration, unspecified cervical region: Secondary | ICD-10-CM | POA: Diagnosis not present

## 2023-06-14 DIAGNOSIS — S43429A Sprain of unspecified rotator cuff capsule, initial encounter: Secondary | ICD-10-CM | POA: Diagnosis not present

## 2023-06-14 DIAGNOSIS — M47816 Spondylosis without myelopathy or radiculopathy, lumbar region: Secondary | ICD-10-CM | POA: Diagnosis not present

## 2023-06-15 DIAGNOSIS — K50919 Crohn's disease, unspecified, with unspecified complications: Secondary | ICD-10-CM | POA: Diagnosis not present

## 2023-06-15 DIAGNOSIS — F4310 Post-traumatic stress disorder, unspecified: Secondary | ICD-10-CM | POA: Diagnosis not present

## 2023-06-15 DIAGNOSIS — K625 Hemorrhage of anus and rectum: Secondary | ICD-10-CM | POA: Diagnosis not present

## 2023-06-15 DIAGNOSIS — E271 Primary adrenocortical insufficiency: Secondary | ICD-10-CM | POA: Diagnosis not present

## 2023-06-15 DIAGNOSIS — G7100 Muscular dystrophy, unspecified: Secondary | ICD-10-CM | POA: Diagnosis not present

## 2023-06-15 DIAGNOSIS — R7303 Prediabetes: Secondary | ICD-10-CM | POA: Diagnosis not present

## 2023-06-15 DIAGNOSIS — R1032 Left lower quadrant pain: Secondary | ICD-10-CM | POA: Diagnosis not present

## 2023-06-15 DIAGNOSIS — E278 Other specified disorders of adrenal gland: Secondary | ICD-10-CM | POA: Diagnosis not present

## 2023-06-15 DIAGNOSIS — E669 Obesity, unspecified: Secondary | ICD-10-CM | POA: Diagnosis not present

## 2023-06-15 DIAGNOSIS — E559 Vitamin D deficiency, unspecified: Secondary | ICD-10-CM | POA: Diagnosis not present

## 2023-06-17 DIAGNOSIS — K625 Hemorrhage of anus and rectum: Secondary | ICD-10-CM | POA: Diagnosis not present

## 2023-06-17 DIAGNOSIS — R1032 Left lower quadrant pain: Secondary | ICD-10-CM | POA: Diagnosis not present

## 2023-06-17 DIAGNOSIS — K6389 Other specified diseases of intestine: Secondary | ICD-10-CM | POA: Diagnosis not present

## 2023-06-17 DIAGNOSIS — E278 Other specified disorders of adrenal gland: Secondary | ICD-10-CM | POA: Diagnosis not present

## 2023-06-21 ENCOUNTER — Other Ambulatory Visit: Payer: Self-pay | Admitting: Internal Medicine

## 2023-06-22 DIAGNOSIS — R269 Unspecified abnormalities of gait and mobility: Secondary | ICD-10-CM | POA: Diagnosis not present

## 2023-06-28 NOTE — Telephone Encounter (Signed)
I just learned that no one is gonna be here the rest of this week. Can you get it today before 4:00?   Or I can put it in the mail.

## 2023-07-08 ENCOUNTER — Other Ambulatory Visit: Payer: Self-pay

## 2023-07-08 MED ORDER — GVOKE HYPOPEN 2-PACK 0.5 MG/0.1ML ~~LOC~~ SOAJ: 0.5000 mg | Freq: Every day | SUBCUTANEOUS | 3 refills | Status: DC

## 2023-07-09 ENCOUNTER — Ambulatory Visit: Payer: Medicare PPO | Admitting: Cardiovascular Disease

## 2023-07-14 ENCOUNTER — Telehealth: Payer: Medicare PPO | Admitting: Student in an Organized Health Care Education/Training Program

## 2023-07-14 ENCOUNTER — Encounter: Payer: Self-pay | Admitting: *Deleted

## 2023-07-14 ENCOUNTER — Ambulatory Visit
Payer: Medicare PPO | Attending: Student in an Organized Health Care Education/Training Program | Admitting: Student in an Organized Health Care Education/Training Program

## 2023-07-14 DIAGNOSIS — G894 Chronic pain syndrome: Secondary | ICD-10-CM

## 2023-07-14 DIAGNOSIS — M5412 Radiculopathy, cervical region: Secondary | ICD-10-CM | POA: Diagnosis not present

## 2023-07-14 DIAGNOSIS — M542 Cervicalgia: Secondary | ICD-10-CM | POA: Diagnosis not present

## 2023-07-14 MED ORDER — BUPRENORPHINE 7.5 MCG/HR TD PTWK: 1.0000 | MEDICATED_PATCH | TRANSDERMAL | 0 refills | Status: AC

## 2023-07-14 NOTE — Progress Notes (Signed)
 Patient: Vickie Stevens  Service Category: E/M  Provider: Wallie Sherry, MD  DOB: 1971/04/08  DOS: 07/14/2023  Location: Office  MRN: [id number]  Setting: Ambulatory outpatient  Referring Provider: Albina GORMAN Dine, MD  Type: Established Patient  Specialty: Interventional Pain Management  PCP: Albina GORMAN Dine, MD  Location: Remote location  Delivery: TeleHealth     Virtual Encounter - Pain Management PROVIDER NOTE: Information contained herein reflects review and annotations entered in association with encounter. Interpretation of such information and data should be left to medically-trained personnel. Information provided to patient can be located elsewhere in the medical record under Patient Instructions. Document created using STT-dictation technology, any transcriptional errors that may result from process are unintentional.    Contact & Pharmacy Preferred: [phone] Home: [phone] (home) Mobile: [phone] (mobile) E-mail: [email or web address]  [facility] [place], [place] - [address] [place] [address] Phone: [phone] Fax: [phone]  [facility] - [place], [place] - [address] [place] [address] Phone: [phone] Fax: [phone]   Pre-screening  Vickie Stevens offered in-person vs virtual encounter. She indicated preferring virtual for this encounter.   Reason COVID-19*  Social distancing based on CDC and AMA recommendations.   I contacted Vickie Stevens on 07/14/2023 via telephone.      I clearly identified myself as Wallie Sherry, MD. I verified that I was speaking with the correct person using two identifiers (Name: BERNIE FOBES, and date of birth: 1970/12/04).  Consent I sought verbal advanced consent from Vickie Stevens for virtual visit interactions. I informed Vickie Stevens of possible security and privacy concerns, risks, and limitations associated with  providing not-in-person medical evaluation and management services. I also informed Vickie Stevens of the availability of in-person appointments. Finally, I informed her that there would be a charge for the virtual visit and that she could be  personally, fully or partially, financially responsible for it. Vickie Stevens expressed understanding and agreed to proceed.   Historic Elements   Vickie Stevens is a 53 y.o. year old, female patient evaluated today after our last contact on 06/07/2023. Vickie Stevens  has a past medical history of ADHD (attention deficit hyperactivity disorder), Biceps tendonitis, Bipolar disorder (HCC), Chronic back pain, Cluster headache, Crohn's disease (HCC), DDD (degenerative disc disease), lumbar, Depression, Diabetes mellitus without complication (HCC), Hypercholesteremia, Hyperkalemia, Hypertension, Lumbar stenosis, Myotonic dystrophy (HCC), PCOS (polycystic ovarian syndrome), Rotator cuff tendinitis, Sleep apnea, and Tuberous sclerosis (HCC). She also  has a past surgical history that includes Endometrial ablation (01-2011); Shoulder surgery (Left, 07-1999, 07-2011); Cataract extraction, bilateral; Esophagogastroduodenoscopy (egd) with propofol  (N/A, 06/08/2018); Colonoscopy with propofol  (N/A, 06/08/2018); SCS 8/ 2021; and Esophagogastroduodenoscopy (egd) with propofol  (N/A, 05/12/2023). Vickie Stevens has a current medication list which includes the following prescription(s): accu-chek fastclix lancets, accu-chek guide test, dropsafe alcohol prep, accu-chek guide, buprenorphine , belbuca , carvedilol, repatha  sureclick, fluticasone, gvoke hypopen  2-pack, accu-chek fastclix lancet, lubiprostone, mexiletine, quetiapine, triamcinolone  cream, valacyclovir , and vitamin d  (ergocalciferol ). She  reports that she has never smoked. She has never used smokeless tobacco. She reports that she does not drink alcohol and does not use drugs. Vickie Stevens is allergic to phenergan [promethazine hcl],  codeine, cortisone, lidocaine , acetaminophen, and oxycodone.  BMI: Estimated body mass index is 35.73 kg/m as calculated from the following:   Height as of 06/07/23: 5' 8 (1.727 m).   Weight as of 06/07/23: 235 lb (106.6 kg). Last encounter: 03/31/2023. Last procedure:  06/07/2023.  HPI  Today, she is being contacted for a post-procedure assessment.   Post-procedure evaluation    Procedure: Cervical Epidural Steroid injection (CESI) (Interlaminar) #1  Laterality: Midline  Level: C7-T1 Imaging: Fluoroscopy-assisted DOS: 06/07/2023  Performed by: Wallie Sherry, MD Anesthesia: Local anesthesia (1-2% Lidocaine ) Sedation: No Sedation                         Purpose: Diagnostic/Therapeutic Indications: Cervicalgia, cervical radicular pain, degenerative disc disease, severe enough to impact quality of life or function. 1. Cervical radicular pain   2. Cervicalgia   3. Chronic pain syndrome    NAS-11 score:   Pre-procedure: 6 /10   Post-procedure: 0-No pain/10     Effectiveness:  Initial hour after procedure: 0 %  Subsequent 4-6 hours post-procedure: 0 %  Analgesia past initial 6 hours: 50 %  Ongoing improvement:  Analgesic:  50% Function: Somewhat improved ROM: Somewhat improved   Pharmacotherapy Assessment   Opioid Analgesic: Belbuca  150 mcg twice daily    Monitoring: [place] PMP: PDMP reviewed during this encounter.       Pharmacotherapy: No side-effects or adverse reactions reported. Compliance: No problems identified. Effectiveness: Clinically acceptable. Plan: Refer to POC. UDS:  Summary  Date Value Ref Range Status  03/05/2022 Note  Final    Comment:    ==================================================================== Tox[employer] Select 13 (MW) ==================================================================== Test                             Result       Flag       Units  Drug Present and Declared for Prescription Verification   Buprenorphine                    4            EXPECTED   ng/mg creat   Norbuprenorphine               14           EXPECTED   ng/mg creat    Source of buprenorphine  is a scheduled prescription medication.    Norbuprenorphine is an expected metabolite of buprenorphine .  ==================================================================== Test                      Result    Flag   Units      Ref Range   Creatinine              190              mg/dL      >=79 ==================================================================== Declared Medications:  The flagging and interpretation on this report are based on the  following declared medications.  Unexpected results may arise from  inaccuracies in the declared medications.   **Note: The testing scope of this panel does not include small to  moderate amounts of these reported medications:   Buprenorphine  (Belbuca )   **Note: The testing scope of this panel does not include the  following reported medications:   Amlodipine  (Norvasc )  Atorvastatin  (Lipitor) ==================================================================== For clinical consultation, please call [phone]. ====================================================================    No results found for: CBDTHCR, KATHLYNE, D9THCCBX   Laboratory Chemistry Profile   Renal Lab Results  Component Value Date   BUN 12 02/25/2023   CREATININE 0.64 02/25/2023   BCR 19 02/25/2023   GFRAA >60 12/08/2017   GFRNONAA >60 12/08/2017  Hepatic Lab Results  Component Value Date   AST 14 02/25/2023   ALT 11 02/25/2023   ALBUMIN 3.8 02/25/2023   ALKPHOS 122 (H) 02/25/2023    Electrolytes Lab Results  Component Value Date   NA 145 (H) 02/25/2023   K 5.3 (H) 02/25/2023   CL 106 02/25/2023   CALCIUM  9.5 02/25/2023    Bone No results found for: VD25OH, VD125OH2TOT, CI6874NY7, CI7874NY7, 25OHVITD1, 25OHVITD2, 25OHVITD3, TESTOFREE, TESTOSTERONE  Inflammation (CRP: Acute Phase)  (ESR: Chronic Phase) No results found for: CRP, ESRSEDRATE, LATICACIDVEN       Note: Above Lab results reviewed.   Assessment  The primary encounter diagnosis was Cervical radicular pain. Diagnoses of Cervicalgia and Chronic pain syndrome were also pertinent to this visit.  Plan of Care  Patient's insurance company is giving her a lot of pushback with belbuca .  They are not approving it.  She has been on belbuca  since 2017 and has been compliant with medication which helps to manage her pain and also improve her functional status.  They are only covering Butrans  patch.  She has tried this before which resulted in skin irritation.  We discussed trying Butrans  patch again to see if she would be able to tolerate it without the skin irritation.  She is amenable to this.  I will send in a prescription below.  We will discuss options for her hip pain which could include peripheral nerve stimulation which she follows up face-to-face.  Requested Prescriptions   Signed Prescriptions Disp Refills   buprenorphine  (BUTRANS ) 7.5 MCG/HR 4 patch 0    Sig: Place 1 patch onto the skin once a week for 28 days.     Follow-up plan:   Return for patient will call to schedule F2F appt prn.      Status post L4-L5 ESI #1 on 10/16/2019, repeat.  Left L4 Sprint peripheral nerve stimulation 02/19/2020; removed 05/08/2020., right L4 Sprint peripheral nerve stimulation 03/13/2020, lead fracture, replaced 04/15/2020, right removed 06/20/2020.  Right axillary peripheral nerve stimulation 07/29/2020.          Recent Visits Date Type Provider Dept  06/07/23 Procedure visit Marcelino Nurse, MD [facility]  Showing recent visits within past 90 days and meeting all other requirements Today's Visits Date Type Provider Dept  07/14/23 Office Visit Marcelino Nurse, MD [facility]  Showing today's visits and meeting all other requirements Future Appointments No visits were found meeting these  conditions. Showing future appointments within next 90 days and meeting all other requirements  I discussed the assessment and treatment plan with the patient. The patient was provided an opportunity to ask questions and all were answered. The patient agreed with the plan and demonstrated an understanding of the instructions.  Patient advised to call back or seek an in-person evaluation if the symptoms or condition worsens.  Duration of encounter: .  Note by: Nurse Marcelino, MD Date: 07/14/2023; Time: 2:46 PM

## 2023-07-15 DIAGNOSIS — M47816 Spondylosis without myelopathy or radiculopathy, lumbar region: Secondary | ICD-10-CM | POA: Diagnosis not present

## 2023-07-15 DIAGNOSIS — M5030 Other cervical disc degeneration, unspecified cervical region: Secondary | ICD-10-CM | POA: Diagnosis not present

## 2023-07-15 DIAGNOSIS — S43429A Sprain of unspecified rotator cuff capsule, initial encounter: Secondary | ICD-10-CM | POA: Diagnosis not present

## 2023-07-23 ENCOUNTER — Ambulatory Visit: Payer: Medicare PPO | Admitting: Cardiovascular Disease

## 2023-08-06 ENCOUNTER — Encounter: Payer: Self-pay | Admitting: Internal Medicine

## 2023-08-06 ENCOUNTER — Other Ambulatory Visit: Payer: Self-pay | Admitting: Internal Medicine

## 2023-08-06 DIAGNOSIS — E119 Type 2 diabetes mellitus without complications: Secondary | ICD-10-CM

## 2023-08-06 MED ORDER — GVOKE HYPOPEN 2-PACK 0.5 MG/0.1ML ~~LOC~~ SOAJ: 0.5000 mg | Freq: Every day | SUBCUTANEOUS | 5 refills | Status: DC

## 2023-08-06 NOTE — Telephone Encounter (Signed)
Rx modified to 0.27ml/mo

## 2023-08-09 DIAGNOSIS — E119 Type 2 diabetes mellitus without complications: Secondary | ICD-10-CM | POA: Diagnosis not present

## 2023-08-10 ENCOUNTER — Encounter: Payer: Medicare PPO | Admitting: Student in an Organized Health Care Education/Training Program

## 2023-08-12 ENCOUNTER — Ambulatory Visit: Payer: Medicare PPO | Admitting: Cardiovascular Disease

## 2023-08-15 DIAGNOSIS — M47816 Spondylosis without myelopathy or radiculopathy, lumbar region: Secondary | ICD-10-CM | POA: Diagnosis not present

## 2023-08-15 DIAGNOSIS — S43429A Sprain of unspecified rotator cuff capsule, initial encounter: Secondary | ICD-10-CM | POA: Diagnosis not present

## 2023-08-15 DIAGNOSIS — M5030 Other cervical disc degeneration, unspecified cervical region: Secondary | ICD-10-CM | POA: Diagnosis not present

## 2023-08-16 ENCOUNTER — Other Ambulatory Visit: Payer: Self-pay | Admitting: Cardiovascular Disease

## 2023-08-16 DIAGNOSIS — R42 Dizziness and giddiness: Secondary | ICD-10-CM

## 2023-08-16 DIAGNOSIS — E782 Mixed hyperlipidemia: Secondary | ICD-10-CM

## 2023-08-16 DIAGNOSIS — I340 Nonrheumatic mitral (valve) insufficiency: Secondary | ICD-10-CM

## 2023-08-16 DIAGNOSIS — R0602 Shortness of breath: Secondary | ICD-10-CM

## 2023-08-16 DIAGNOSIS — E119 Type 2 diabetes mellitus without complications: Secondary | ICD-10-CM

## 2023-08-16 DIAGNOSIS — R0789 Other chest pain: Secondary | ICD-10-CM

## 2023-08-17 ENCOUNTER — Other Ambulatory Visit: Payer: Self-pay | Admitting: Internal Medicine

## 2023-08-17 DIAGNOSIS — E119 Type 2 diabetes mellitus without complications: Secondary | ICD-10-CM

## 2023-08-22 ENCOUNTER — Other Ambulatory Visit: Payer: Self-pay | Admitting: Student in an Organized Health Care Education/Training Program

## 2023-08-23 ENCOUNTER — Encounter: Payer: Self-pay | Admitting: Internal Medicine

## 2023-08-23 ENCOUNTER — Encounter: Payer: Self-pay | Admitting: Student in an Organized Health Care Education/Training Program

## 2023-08-24 ENCOUNTER — Other Ambulatory Visit: Payer: Self-pay | Admitting: *Deleted

## 2023-08-24 ENCOUNTER — Other Ambulatory Visit: Payer: Self-pay | Admitting: Internal Medicine

## 2023-08-24 ENCOUNTER — Encounter: Payer: Self-pay | Admitting: *Deleted

## 2023-08-24 ENCOUNTER — Ambulatory Visit: Payer: Medicare PPO | Admitting: Cardiovascular Disease

## 2023-08-24 DIAGNOSIS — E119 Type 2 diabetes mellitus without complications: Secondary | ICD-10-CM

## 2023-08-24 DIAGNOSIS — M5416 Radiculopathy, lumbar region: Secondary | ICD-10-CM

## 2023-08-24 DIAGNOSIS — G894 Chronic pain syndrome: Secondary | ICD-10-CM

## 2023-08-24 DIAGNOSIS — G4733 Obstructive sleep apnea (adult) (pediatric): Secondary | ICD-10-CM | POA: Diagnosis not present

## 2023-08-24 DIAGNOSIS — G7111 Myotonic muscular dystrophy: Secondary | ICD-10-CM

## 2023-08-24 DIAGNOSIS — M48062 Spinal stenosis, lumbar region with neurogenic claudication: Secondary | ICD-10-CM

## 2023-08-24 DIAGNOSIS — E7402 Pompe disease: Secondary | ICD-10-CM

## 2023-08-24 MED ORDER — ZEGALOGUE 0.6 MG/0.6ML ~~LOC~~ SOAJ: 0.6000 mg | Freq: Every day | SUBCUTANEOUS | 2 refills | Status: AC | PRN

## 2023-08-24 MED ORDER — BELBUCA 150 MCG BU FILM: 1.0000 | ORAL_FILM | Freq: Two times a day (BID) | BUCCAL | 5 refills | Status: DC

## 2023-09-05 ENCOUNTER — Other Ambulatory Visit: Payer: Self-pay | Admitting: Internal Medicine

## 2023-09-06 ENCOUNTER — Other Ambulatory Visit: Payer: Self-pay | Admitting: Internal Medicine

## 2023-09-07 ENCOUNTER — Encounter: Payer: Self-pay | Admitting: Internal Medicine

## 2023-09-07 NOTE — Telephone Encounter (Signed)
 Spoke with patient daughter in person at the office and have sent 90 day request to the provider to be [facility] to [facility]

## 2023-09-14 DIAGNOSIS — S43429A Sprain of unspecified rotator cuff capsule, initial encounter: Secondary | ICD-10-CM | POA: Diagnosis not present

## 2023-09-14 DIAGNOSIS — M47816 Spondylosis without myelopathy or radiculopathy, lumbar region: Secondary | ICD-10-CM | POA: Diagnosis not present

## 2023-09-14 DIAGNOSIS — M5030 Other cervical disc degeneration, unspecified cervical region: Secondary | ICD-10-CM | POA: Diagnosis not present

## 2023-09-17 ENCOUNTER — Ambulatory Visit: Payer: Medicare PPO | Admitting: Cardiovascular Disease

## 2023-09-24 ENCOUNTER — Encounter: Payer: Self-pay | Admitting: Student in an Organized Health Care Education/Training Program

## 2023-09-29 ENCOUNTER — Encounter: Payer: Self-pay | Admitting: *Deleted

## 2023-10-01 ENCOUNTER — Ambulatory Visit (INDEPENDENT_AMBULATORY_CARE_PROVIDER_SITE_OTHER): Admitting: Cardiovascular Disease

## 2023-10-01 ENCOUNTER — Encounter: Payer: Self-pay | Admitting: Cardiovascular Disease

## 2023-10-01 VITALS — BP 130/80 | HR 96 | Ht 68.0 in | Wt 239.0 lb

## 2023-10-01 DIAGNOSIS — R0602 Shortness of breath: Secondary | ICD-10-CM | POA: Diagnosis not present

## 2023-10-01 DIAGNOSIS — I340 Nonrheumatic mitral (valve) insufficiency: Secondary | ICD-10-CM

## 2023-10-01 DIAGNOSIS — E782 Mixed hyperlipidemia: Secondary | ICD-10-CM | POA: Diagnosis not present

## 2023-10-01 DIAGNOSIS — I10 Essential (primary) hypertension: Secondary | ICD-10-CM | POA: Diagnosis not present

## 2023-10-01 DIAGNOSIS — R0789 Other chest pain: Secondary | ICD-10-CM

## 2023-10-01 NOTE — Progress Notes (Signed)
 Cardiology Office Note   Date:  10/01/2023   ID:  Vickie Stevens, DOB [birth year], MRN [id number]  PCP:  Sherron Monday, MD  Cardiologist:  Adrian Blackwater, MD      History of Present Illness: Vickie Stevens is a 53 y.o. female who presents for  Chief Complaint  Patient presents with   Follow-up    3 month follow up     Ocasional chest pain      Past Medical History:  Diagnosis Date   ADHD (attention deficit hyperactivity disorder)    Biceps tendonitis    Bipolar disorder (HCC)    Chronic back pain    Cluster headache    Crohn's disease (HCC)    DDD (degenerative disc disease), lumbar    Depression    Diabetes mellitus without complication (HCC)    Hypercholesteremia    Hyperkalemia    Hypertension    Lumbar stenosis    Myotonic dystrophy (HCC)    PCOS (polycystic ovarian syndrome)    Rotator cuff tendinitis    Sleep apnea    Tuberous sclerosis (HCC)      Past Surgical History:  Procedure Laterality Date   CATARACT EXTRACTION, BILATERAL     COLONOSCOPY WITH PROPOFOL N/A 06/08/2018   Procedure: COLONOSCOPY WITH PROPOFOL;  Surgeon: Christena Deem, MD;  Location: [facility] ENDOSCOPY;  Service: Endoscopy;  Laterality: N/A;   ENDOMETRIAL ABLATION  01-2011   ESOPHAGOGASTRODUODENOSCOPY (EGD) WITH PROPOFOL N/A 06/08/2018   Procedure: ESOPHAGOGASTRODUODENOSCOPY (EGD) WITH PROPOFOL;  Surgeon: Christena Deem, MD;  Location[facility] ENDOSCOPY;  Service: Endoscopy;  Laterality: N/A;   ESOPHAGOGASTRODUODENOSCOPY (EGD) WITH PROPOFOL N/A 05/12/2023   Procedure: ESOPHAGOGASTRODUODENOSCOPY (EGD) WITH PROPOFOL;  Surgeon: Toledo, Boykin Nearing, MD;  Location: [facility] ENDOSCOPY;  Service: Gastroenterology;  Laterality: N/A;   SCS 8/ 2021     SHOULDER SURGERY Left 07-1999, 07-2011   x 4     Current Outpatient Medications  Medication Sig Dispense Refill   Accu-Chek FastClix Lancets MISC TEST TWO TIMES DAILY 204 each 3   ACCU-CHEK GUIDE TEST test strip TEST BLOOD SUGAR TWO TIMES  DAILY 200 strip 3   Alcohol Swabs (DROPSAFE ALCOHOL PREP) 70 % PADS USE TWO TIMES DAILY 200 each 3   atorvastatin (LIPITOR) 20 MG tablet TAKE 1 TABLET AT BEDTIME 90 tablet 1   Blood Glucose Monitoring Suppl (ACCU-CHEK GUIDE) w/Device KIT USE AS DIRECTED 1 kit 3   Buprenorphine HCl (BELBUCA) 150 MCG FILM Place 1 Film inside cheek every 12 (twelve) hours. 60 each 5   carvedilol (COREG) 6.25 MG tablet Take 6.25 mg by mouth 2 (two) times daily with a meal.     Dasiglucagon HCl (ZEGALOGUE) 0.6 MG/0.6ML SOAJ Inject 0.6 mg into the skin daily as needed. For blood sugar 55 or less 0.6 mL 2   Evolocumab (REPATHA SURECLICK) 140 MG/ML SOAJ INJECT 140 MG INTO THE SKIN EVERY 14 (FOURTEEN) DAYS. 2 mL 2   fluticasone (FLONASE) 50 MCG/ACT nasal spray USE 1 SPRAY NASALLY EVERY DAY 32 g 3   Glucagon (GVOKE HYPOPEN 2-PACK) 0.5 MG/0.1ML SOAJ Inject 0.5 mg as directed daily. 0.4 mL 5   Lancets Misc. (ACCU-CHEK FASTCLIX LANCET) KIT USE AS DIRECTED 1 kit 3   lubiprostone (AMITIZA) 24 MCG capsule Take 24 mcg by mouth 2 (two) times daily.     mexiletine (MEXITIL) 150 MG capsule Take 150 mg by mouth 3 (three) times daily.     QUEtiapine (SEROQUEL) 25 MG tablet Take 25 mg by mouth  as needed.     triamcinolone cream (KENALOG) 0.1 % APPLY TO AFFECTED AREA AS DIRECTED UP TO TWICE DAILY 90 g 3   valACYclovir (VALTREX) 500 MG tablet TAKE 1 TABLET EVERY DAY 90 tablet 3   Vitamin D, Ergocalciferol, (DRISDOL) 1.25 MG (50000 UNIT) CAPS capsule Take 1 capsule (50,000 Units total) by mouth once a week. 12 capsule 1   No current facility-administered medications for this visit.    Allergies:   Phenergan [promethazine hcl], Codeine, Cortisone, Lidocaine, Acetaminophen, and Oxycodone    Social History:   reports that she has never smoked. She has never used smokeless tobacco. She reports that she does not drink alcohol and does not use drugs.   Family History:  family history includes Breast cancer in her maternal aunt; Diabetes  in her father; Hyperlipidemia in her father; Hypertension in her father.    ROS:     Review of Systems  Constitutional: Negative.   HENT: Negative.    Eyes: Negative.   Respiratory: Negative.    Gastrointestinal: Negative.   Genitourinary: Negative.   Musculoskeletal: Negative.   Skin: Negative.   Neurological: Negative.   Endo/Heme/Allergies: Negative.   Psychiatric/Behavioral: Negative.    All other systems reviewed and are negative.     All other systems are reviewed and negative.    PHYSICAL EXAM: VS:  BP 130/80   Pulse 96   Ht 5\' 8"  (1.727 m)   Wt 239 lb (108.4 kg)   SpO2 97%   BMI 36.34 kg/m  , BMI Body mass index is 36.34 kg/m. Last weight:  Wt Readings from Last 3 Encounters:  10/01/23 239 lb (108.4 kg)  06/07/23 235 lb (106.6 kg)  05/12/23 239 lb (108.4 kg)     Physical Exam Constitutional:      Appearance: Normal appearance.  Cardiovascular:     Rate and Rhythm: Normal rate and regular rhythm.     Heart sounds: Normal heart sounds.  Pulmonary:     Effort: Pulmonary effort is normal.     Breath sounds: Normal breath sounds.  Musculoskeletal:     Right lower leg: No edema.     Left lower leg: No edema.  Neurological:     Mental Status: She is alert.       EKG:   Recent Labs: 02/25/2023: ALT 11; BUN 12; Creatinine, Ser 0.64; Potassium 5.3; Sodium 145    Lipid Panel    Component Value Date/Time   CHOL 222 (H) 02/25/2023 0936   TRIG 184 (H) 02/25/2023 0936   HDL 57 02/25/2023 0936   CHOLHDL 3.9 02/25/2023 0936   LDLCALC 132 (H) 02/25/2023 0936      Other studies Reviewed: Additional studies/ records that were reviewed today include:  Review of the above records demonstrates:       No data to display            ASSESSMENT AND PLAN:    ICD-10-CM   1. Primary hypertension  I10    Stable    2. Mixed hyperlipidemia  E78.2     3. SOB (shortness of breath)  R06.02     4. Chest pain, non-cardiac  R07.89     5.  Nonrheumatic mitral valve regurgitation  I34.0        Problem List Items Addressed This Visit       Other   Mixed hyperlipidemia   Other Visit Diagnoses       Primary hypertension    -  Primary  Stable     SOB (shortness of breath)         Chest pain, non-cardiac         Nonrheumatic mitral valve regurgitation              Disposition:   Return in about 3 months (around 01/01/2024).    Total time spent: 30 minutes  Signed,  Adrian Blackwater, MD  10/01/2023 11:12 AM    [facility]

## 2023-10-12 ENCOUNTER — Ambulatory Visit: Payer: Medicare PPO | Admitting: Cardiovascular Disease

## 2023-10-15 DIAGNOSIS — S43429A Sprain of unspecified rotator cuff capsule, initial encounter: Secondary | ICD-10-CM | POA: Diagnosis not present

## 2023-10-15 DIAGNOSIS — M47816 Spondylosis without myelopathy or radiculopathy, lumbar region: Secondary | ICD-10-CM | POA: Diagnosis not present

## 2023-10-15 DIAGNOSIS — M5030 Other cervical disc degeneration, unspecified cervical region: Secondary | ICD-10-CM | POA: Diagnosis not present

## 2023-10-18 ENCOUNTER — Ambulatory Visit: Admitting: Cardiovascular Disease

## 2023-10-25 ENCOUNTER — Other Ambulatory Visit

## 2023-10-25 DIAGNOSIS — E782 Mixed hyperlipidemia: Secondary | ICD-10-CM | POA: Diagnosis not present

## 2023-10-25 DIAGNOSIS — E119 Type 2 diabetes mellitus without complications: Secondary | ICD-10-CM

## 2023-10-26 ENCOUNTER — Other Ambulatory Visit: Payer: Self-pay | Admitting: Internal Medicine

## 2023-10-26 DIAGNOSIS — Z1231 Encounter for screening mammogram for malignant neoplasm of breast: Secondary | ICD-10-CM

## 2023-10-26 LAB — LIPID PANEL
Chol/HDL Ratio: 3.2 ratio (ref 0.0–4.4)
Cholesterol, Total: 198 mg/dL (ref 100–199)
HDL: 61 mg/dL (ref >39)
LDL Chol Calc (NIH): 113 mg/dL — ABNORMAL HIGH (ref 0–99)
Triglycerides: 134 mg/dL (ref 0–149)
VLDL Cholesterol Cal: 24 mg/dL (ref 5–40)

## 2023-10-26 LAB — HEMOGLOBIN A1C
Est. average glucose Bld gHb Est-mCnc: 143 mg/dL
Hgb A1c MFr Bld: 6.6 % — ABNORMAL HIGH (ref 4.8–5.6)

## 2023-10-27 DIAGNOSIS — M25552 Pain in left hip: Secondary | ICD-10-CM | POA: Diagnosis not present

## 2023-10-27 DIAGNOSIS — R2689 Other abnormalities of gait and mobility: Secondary | ICD-10-CM | POA: Diagnosis not present

## 2023-10-27 DIAGNOSIS — G7111 Myotonic muscular dystrophy: Secondary | ICD-10-CM | POA: Diagnosis not present

## 2023-10-28 ENCOUNTER — Encounter: Payer: Self-pay | Admitting: Internal Medicine

## 2023-10-29 ENCOUNTER — Ambulatory Visit: Admitting: Internal Medicine

## 2023-10-29 ENCOUNTER — Encounter: Payer: Self-pay | Admitting: Internal Medicine

## 2023-10-29 VITALS — BP 122/82 | HR 90 | Temp 97.7°F | Ht 68.0 in | Wt 240.8 lb

## 2023-10-29 DIAGNOSIS — E119 Type 2 diabetes mellitus without complications: Secondary | ICD-10-CM | POA: Diagnosis not present

## 2023-10-29 DIAGNOSIS — E782 Mixed hyperlipidemia: Secondary | ICD-10-CM

## 2023-10-29 DIAGNOSIS — Z0130 Encounter for examination of blood pressure without abnormal findings: Secondary | ICD-10-CM

## 2023-10-29 LAB — GLUCOSE, POCT (MANUAL RESULT ENTRY): POC Glucose: 126 mg/dL — AB (ref 70–99)

## 2023-10-29 NOTE — Progress Notes (Signed)
 Established Patient Office Visit  Subjective:  Patient ID: Vickie Stevens, female    DOB: [birth year]  Age: 53 y.o. MRN: [id number]  Chief Complaint  Patient presents with   Follow-up    Follow up    No new complaints, here for lab review and medication refills. Labs reviewed and notable for well controlled diabetes, A1c remains at target, lipids not at target due to inability to refill Repatha  with unremarkable cmp.  Unable to check her home bg due to delay in insurance approval for her sensors. Also c/o intermittent pruritic rash on her chest.    No other concerns at this time.   Past Medical History:  Diagnosis Date   ADHD (attention deficit hyperactivity disorder)    Biceps tendonitis    Bipolar disorder (HCC)    Chronic back pain    Cluster headache    Crohn'[place] Upham disease (HCC)    DDD (degenerative disc disease), lumbar    Depression    Diabetes mellitus without complication (HCC)    Hypercholesteremia    Hyperkalemia    Hypertension    Lumbar stenosis    Myotonic dystrophy (HCC)    PCOS (polycystic ovarian syndrome)    Rotator cuff tendinitis    Sleep apnea    Tuberous sclerosis (HCC)     Past Surgical History:  Procedure Laterality Date   CATARACT EXTRACTION, BILATERAL     COLONOSCOPY WITH PROPOFOL  N/A 06/08/2018   Procedure: COLONOSCOPY WITH PROPOFOL ;  Surgeon: Deveron Fly, MD;  Location: [facility] ENDOSCOPY;  Service: Endoscopy;  Laterality: N/A;   ENDOMETRIAL ABLATION  01-2011   ESOPHAGOGASTRODUODENOSCOPY (EGD) WITH PROPOFOL  N/A 06/08/2018   Procedure: ESOPHAGOGASTRODUODENOSCOPY (EGD) WITH PROPOFOL ;  Surgeon: Deveron Fly, MD;  Location: [facility] ENDOSCOPY;  Service: Endoscopy;  Laterality: N/A;   ESOPHAGOGASTRODUODENOSCOPY (EGD) WITH PROPOFOL  N/A 05/12/2023   Procedure: ESOPHAGOGASTRODUODENOSCOPY (EGD) WITH PROPOFOL ;  Surgeon: Toledo, Alphonsus Jeans, MD;  Location: [facility] ENDOSCOPY;  Service: Gastroenterology;  Laterality: N/A;   SCS 8/ 2021     SHOULDER SURGERY  Left 07-1999, 07-2011   x 4    Social History   Socioeconomic History   Marital status: Divorced    Spouse name: Not on file   Number of children: Not on file   Years of education: Not on file   Highest education level: Not on file  Occupational History   Not on file  Tobacco Use   Smoking status: Never   Smokeless tobacco: Never  Vaping Use   Vaping status: Never Used  Substance and Sexual Activity   Alcohol use: No   Drug use: Never   Sexual activity: Not on file  Other Topics Concern   Not on file  Social History Narrative   Not on file   Social Drivers of Health   Financial Resource Strain: Medium Risk (12/16/2022)   Received from [facility], [facility]   Overall Financial Resource Strain (CARDIA)    Difficulty of Paying Living Expenses: Somewhat hard  Food Insecurity: No Food Insecurity (12/16/2022)   Received from [facility], [facility]   Hunger Vital Sign    Worried About Running Out of Food in the Last Year: Never true    Ran Out of Food in the Last Year: Never true  Transportation Needs: Unmet Transportation Needs (12/16/2022)   Received from [facility], [facility]   [facility] - Transportation    In the past 12 months, has lack  of transportation kept you from medical appointments or from getting medications?: Yes    Lack of Transportation (Non-Medical): Yes  Physical Activity: Insufficiently Active (04/09/2021)   Received from [facility], [facility]   Exercise Vital Sign    Days of Exercise per Week: 1 day    Minutes of Exercise per Session: 30 min  Stress: Stress Concern Present (04/09/2021)   Received from [facility], [facility]   [employer] [facility] - Occupational Stress Questionnaire    Feeling of Stress : Very much  Social Connections: Unknown  (03/27/2023)   Received from [facility]   Social Network    Social Network: Not on file  Intimate Partner Violence: Not At Risk (03/27/2023)   Received from [facility]   HITS    Over the last 12 months how often did your partner physically hurt you?: Never    Over the last 12 months how often did your partner insult you or talk down to you?: Never    Over the last 12 months how often did your partner threaten you with physical harm?: Never    Over the last 12 months how often did your partner scream or curse at you?: Never    Family History  Problem Relation Age of Onset   Diabetes Father    Hyperlipidemia Father    Hypertension Father    Breast cancer Maternal Aunt     Allergies  Allergen Reactions   Phenergan [Promethazine Hcl] Other (See Comments)    Altered mental status; patient remarked that she ripped out her IV and tore up the hospital room   Codeine Hives   Cortisone Other (See Comments)    Steroid injections may have contributed to cataracts   Lidocaine  Hives    With oral, only   Acetaminophen Rash   Oxycodone Diarrhea and Rash    Pruritic rash, also    Outpatient Medications Prior to Visit  Medication Sig   Accu-Chek FastClix Lancets MISC TEST TWO TIMES DAILY   ACCU-CHEK GUIDE TEST test strip TEST BLOOD SUGAR TWO TIMES DAILY   Alcohol Swabs (DROPSAFE ALCOHOL PREP) 70 % PADS USE TWO TIMES DAILY   atorvastatin (LIPITOR) 20 MG tablet TAKE 1 TABLET AT BEDTIME   Blood Glucose Monitoring Suppl (ACCU-CHEK GUIDE) w/Device KIT USE AS DIRECTED   Buprenorphine  HCl (BELBUCA ) 150 MCG FILM Place 1 Film inside cheek every 12 (twelve) hours.   carvedilol (COREG) 6.25 MG tablet Take 6.25 mg by mouth 2 (two) times daily with a meal.   Dasiglucagon  HCl (ZEGALOGUE ) 0.6 MG/0.6ML SOAJ Inject 0.6 mg into the skin daily as needed. For blood sugar 55 or less   Evolocumab  (REPATHA  SURECLICK) 140 MG/ML SOAJ INJECT 140 MG INTO THE SKIN EVERY 14 (FOURTEEN) DAYS.   fluticasone  (FLONASE) 50 MCG/ACT nasal spray USE 1 SPRAY NASALLY EVERY DAY   Glucagon  (GVOKE HYPOPEN  2-PACK) 0.5 MG/0.1ML SOAJ Inject 0.5 mg as directed daily.   Lancets Misc. (ACCU-CHEK FASTCLIX LANCET) KIT USE AS DIRECTED   lubiprostone (AMITIZA) 24 MCG capsule Take 24 mcg by mouth 2 (two) times daily.   mexiletine (MEXITIL) 150 MG capsule Take 150 mg by mouth 3 (three) times daily.   QUEtiapine (SEROQUEL) 25 MG tablet Take 25 mg by mouth as needed.   triamcinolone  cream (KENALOG ) 0.1 % APPLY TO AFFECTED AREA AS DIRECTED UP TO TWICE DAILY   valACYclovir  (VALTREX ) 500 MG tablet TAKE 1 TABLET EVERY DAY  Vitamin D , Ergocalciferol , (DRISDOL ) 1.25 MG (50000 UNIT) CAPS capsule Take 1 capsule (50,000 Units total) by mouth once a week.   No facility-administered medications prior to visit.    Review of Systems  Constitutional: Negative.        Gained weight  HENT: Negative.    Eyes: Negative.   Respiratory: Negative.    Cardiovascular: Negative.   Gastrointestinal: Negative.   Genitourinary: Negative.   Skin: Negative.   Neurological: Negative.   Endo/Heme/Allergies: Negative.        Objective:   BP 122/82   Pulse 90   Temp 97.7 F (36.5 C)   Ht 5\' 8"  (1.727 m)   Wt 240 lb 12.8 oz (109.2 kg)   SpO2 98%   BMI 36.61 kg/m   Vitals:   10/29/23 1108  BP: 122/82  Pulse: 90  Temp: 97.7 F (36.5 C)  Height: 5\' 8"  (1.727 m)  Weight: 240 lb 12.8 oz (109.2 kg)  SpO2: 98%  BMI (Calculated): 36.62    Physical Exam Vitals reviewed.  Constitutional:      General: She is not in acute distress.    Appearance: She is obese.  HENT:     Head: Normocephalic.     Nose: Nose normal.     Mouth/Throat:     Mouth: Mucous membranes are moist.  Eyes:     Extraocular Movements: Extraocular movements intact.     Pupils: Pupils are equal, round, and reactive to light.  Cardiovascular:     Rate and Rhythm: Normal rate and regular rhythm.     Heart sounds: No murmur heard. Pulmonary:     Effort:  Pulmonary effort is normal.     Breath sounds: No rhonchi or rales.  Abdominal:     General: Abdomen is flat.     Palpations: There is no hepatomegaly, splenomegaly or mass.  Musculoskeletal:        General: Normal range of motion.     Cervical back: Normal range of motion. No tenderness.  Skin:    General: Skin is warm and dry.  Neurological:     General: No focal deficit present.     Mental Status: She is alert and oriented to person, place, and time.     Cranial Nerves: No cranial nerve deficit.     Motor: No weakness.  Psychiatric:        Mood and Affect: Mood normal.        Behavior: Behavior normal.      Results for orders placed or performed in visit on 10/29/23  POCT Glucose (CBG)  Result Value Ref Range   POC Glucose 126 (A) 70 - 99 mg/dl    Recent Results (from the past 2160 hours)  Hemoglobin A1c     Status: Abnormal   Collection Time: 10/25/23  9:31 AM  Result Value Ref Range   Hgb A1c MFr Bld 6.6 (H) 4.8 - 5.6 %    Comment:          Prediabetes: 5.7 - 6.4          Diabetes: >6.4          Glycemic control for adults with diabetes: <7.0    Est. average glucose Bld gHb Est-mCnc 143 mg/dL  Lipid panel     Status: Abnormal   Collection Time: 10/25/23  9:31 AM  Result Value Ref Range   Cholesterol, Total 198 100 - 199 mg/dL   Triglycerides 161 0 - 149 mg/dL   HDL 61 >  39 mg/dL   VLDL Cholesterol Cal 24 5 - 40 mg/dL   LDL Chol Calc (NIH) 782 (H) 0 - 99 mg/dL   Chol/HDL Ratio 3.2 0.0 - 4.4 ratio    Comment:                                   T. Chol/HDL Ratio                                             Men  Women                               1/2 Avg.Risk  3.4    3.3                                   Avg.Risk  5.0    4.4                                2X Avg.Risk  9.6    7.1                                3X Avg.Risk 23.4   11.0   POCT Glucose (CBG)     Status: Abnormal   Collection Time: 10/29/23 11:13 AM  Result Value Ref Range   POC Glucose 126 (A) 70 -  99 mg/dl      Assessment & Plan:  As per problem list.  Problem List Items Addressed This Visit       Endocrine   Type 2 diabetes mellitus (HCC) - Primary   Relevant Orders   POCT Glucose (CBG) (Completed)     Other   Mixed hyperlipidemia    Return in about 3 months (around 01/28/2024) for fu with labs prior.   Total time spent: 20 minutes  Arzella Bitters, MD  10/29/2023   This document may have been prepared by [facility] Voice Recognition software and as such may include unintentional dictation errors.

## 2023-11-04 ENCOUNTER — Other Ambulatory Visit: Payer: Self-pay | Admitting: Internal Medicine

## 2023-11-06 ENCOUNTER — Other Ambulatory Visit: Payer: Self-pay | Admitting: Internal Medicine

## 2023-11-07 DIAGNOSIS — G4733 Obstructive sleep apnea (adult) (pediatric): Secondary | ICD-10-CM | POA: Diagnosis not present

## 2023-11-07 DIAGNOSIS — E119 Type 2 diabetes mellitus without complications: Secondary | ICD-10-CM | POA: Diagnosis not present

## 2023-11-18 DIAGNOSIS — G4733 Obstructive sleep apnea (adult) (pediatric): Secondary | ICD-10-CM | POA: Diagnosis not present

## 2023-11-22 ENCOUNTER — Other Ambulatory Visit: Payer: Self-pay | Admitting: Internal Medicine

## 2023-11-24 ENCOUNTER — Encounter: Payer: Self-pay | Admitting: Internal Medicine

## 2023-11-26 ENCOUNTER — Other Ambulatory Visit: Payer: Self-pay | Admitting: Internal Medicine

## 2023-11-26 DIAGNOSIS — E119 Type 2 diabetes mellitus without complications: Secondary | ICD-10-CM

## 2023-11-26 MED ORDER — ACCU-CHEK GUIDE W/DEVICE KIT: 1.0000 | PACK | 0 refills | Status: AC

## 2023-11-29 ENCOUNTER — Ambulatory Visit: Admitting: Internal Medicine

## 2023-11-30 ENCOUNTER — Ambulatory Visit: Admitting: Internal Medicine

## 2023-11-30 ENCOUNTER — Encounter

## 2023-12-01 ENCOUNTER — Encounter

## 2023-12-10 ENCOUNTER — Ambulatory Visit
Admission: RE | Admit: 2023-12-10 | Discharge: 2023-12-10 | Disposition: A | Discharge status: Home / Self Care | Source: Ambulatory Visit | Attending: Internal Medicine | Admitting: Internal Medicine

## 2023-12-10 DIAGNOSIS — G7111 Myotonic muscular dystrophy: Secondary | ICD-10-CM | POA: Diagnosis not present

## 2023-12-10 DIAGNOSIS — Z1231 Encounter for screening mammogram for malignant neoplasm of breast: Secondary | ICD-10-CM | POA: Diagnosis not present

## 2023-12-14 ENCOUNTER — Encounter: Payer: Self-pay | Admitting: Family

## 2023-12-14 ENCOUNTER — Ambulatory Visit (INDEPENDENT_AMBULATORY_CARE_PROVIDER_SITE_OTHER): Admitting: Family

## 2023-12-14 VITALS — BP 112/84 | HR 92 | Ht 68.0 in | Wt 242.0 lb

## 2023-12-14 DIAGNOSIS — G7111 Myotonic muscular dystrophy: Secondary | ICD-10-CM | POA: Diagnosis not present

## 2023-12-14 DIAGNOSIS — Z1151 Encounter for screening for human papillomavirus (HPV): Secondary | ICD-10-CM | POA: Diagnosis not present

## 2023-12-14 DIAGNOSIS — Z1272 Encounter for screening for malignant neoplasm of vagina: Secondary | ICD-10-CM

## 2023-12-14 DIAGNOSIS — Z124 Encounter for screening for malignant neoplasm of cervix: Secondary | ICD-10-CM

## 2023-12-14 DIAGNOSIS — Z01419 Encounter for gynecological examination (general) (routine) without abnormal findings: Secondary | ICD-10-CM | POA: Diagnosis not present

## 2023-12-14 DIAGNOSIS — Z0130 Encounter for examination of blood pressure without abnormal findings: Secondary | ICD-10-CM | POA: Diagnosis not present

## 2023-12-14 DIAGNOSIS — M5416 Radiculopathy, lumbar region: Secondary | ICD-10-CM | POA: Diagnosis not present

## 2023-12-16 ENCOUNTER — Ambulatory Visit: Payer: Self-pay | Admitting: Internal Medicine

## 2023-12-21 ENCOUNTER — Encounter: Payer: Self-pay | Admitting: Cardiovascular Disease

## 2023-12-21 ENCOUNTER — Other Ambulatory Visit: Payer: Self-pay

## 2023-12-21 DIAGNOSIS — E782 Mixed hyperlipidemia: Secondary | ICD-10-CM

## 2023-12-21 DIAGNOSIS — E119 Type 2 diabetes mellitus without complications: Secondary | ICD-10-CM

## 2023-12-21 DIAGNOSIS — R0602 Shortness of breath: Secondary | ICD-10-CM

## 2023-12-21 DIAGNOSIS — I340 Nonrheumatic mitral (valve) insufficiency: Secondary | ICD-10-CM

## 2023-12-21 DIAGNOSIS — R42 Dizziness and giddiness: Secondary | ICD-10-CM

## 2023-12-21 DIAGNOSIS — R0789 Other chest pain: Secondary | ICD-10-CM

## 2023-12-21 MED ORDER — REPATHA SURECLICK 140 MG/ML ~~LOC~~ SOAJ: 140.0000 mg | SUBCUTANEOUS | 2 refills | Status: AC

## 2024-01-03 ENCOUNTER — Encounter: Payer: Self-pay | Admitting: Cardiovascular Disease

## 2024-01-03 ENCOUNTER — Ambulatory Visit: Admitting: Cardiovascular Disease

## 2024-01-03 ENCOUNTER — Ambulatory Visit (INDEPENDENT_AMBULATORY_CARE_PROVIDER_SITE_OTHER): Admitting: Cardiovascular Disease

## 2024-01-03 VITALS — BP 116/82 | HR 81 | Ht 68.0 in | Wt 243.8 lb

## 2024-01-03 DIAGNOSIS — R0789 Other chest pain: Secondary | ICD-10-CM

## 2024-01-03 DIAGNOSIS — I10 Essential (primary) hypertension: Secondary | ICD-10-CM | POA: Diagnosis not present

## 2024-01-03 DIAGNOSIS — R0602 Shortness of breath: Secondary | ICD-10-CM | POA: Diagnosis not present

## 2024-01-03 DIAGNOSIS — E782 Mixed hyperlipidemia: Secondary | ICD-10-CM | POA: Diagnosis not present

## 2024-01-03 DIAGNOSIS — I340 Nonrheumatic mitral (valve) insufficiency: Secondary | ICD-10-CM

## 2024-01-03 MED ORDER — ATORVASTATIN CALCIUM 20 MG PO TABS: 20.0000 mg | ORAL_TABLET | Freq: Every day | ORAL | 3 refills | Status: AC

## 2024-01-03 MED ORDER — AMLODIPINE BESYLATE 10 MG PO TABS: 10.0000 mg | ORAL_TABLET | Freq: Every day | ORAL | 11 refills | Status: AC

## 2024-01-03 NOTE — Progress Notes (Signed)
 Cardiology Office Note   Date:  01/03/2024   ID:  Vickie Stevens, DOB 1970-08-28, MRN [id number]  PCP:  Albina GORMAN Dine, MD  Cardiologist:  Denyse Bathe, MD      History of Present Illness: Vickie Stevens is a 53 y.o. female who presents for  Chief Complaint  Patient presents with  . Follow-up    3 month follow up     Has higher BP at home.     Past Medical History:  Diagnosis Date  . ADHD (attention deficit hyperactivity disorder)   . Biceps tendonitis   . Bipolar disorder (HCC)   . Chronic back pain   . Cluster headache   . Crohn's disease (HCC)   . DDD (degenerative disc disease), lumbar   . Depression   . Diabetes mellitus without complication (HCC)   . Hypercholesteremia   . Hyperkalemia   . Hypertension   . Lumbar stenosis   . Myotonic dystrophy (HCC)   . PCOS (polycystic ovarian syndrome)   . Rotator cuff tendinitis   . Sleep apnea   . Tuberous sclerosis [facility])      Past Surgical History:  Procedure Laterality Date  . CATARACT EXTRACTION, BILATERAL    . COLONOSCOPY WITH PROPOFOL  N/A 06/08/2018   Procedure: COLONOSCOPY WITH PROPOFOL ;  Surgeon: Gaylyn Gladis PENNER, MD;  Location[facility] ENDOSCOPY;  Service: Endoscopy;  Laterality: N/A;  . ENDOMETRIAL ABLATION  01-2011  . ESOPHAGOGASTRODUODENOSCOPY (EGD) WITH PROPOFOL  N/A 06/08/2018   Procedure: ESOPHAGOGASTRODUODENOSCOPY (EGD) WITH PROPOFOL ;  Surgeon: Gaylyn Gladis PENNER, MD;  Location: [facility] ENDOSCOPY;  Service: Endoscopy;  Laterality: N/A;  . ESOPHAGOGASTRODUODENOSCOPY (EGD) WITH PROPOFOL  N/A 05/12/2023   Procedure: ESOPHAGOGASTRODUODENOSCOPY (EGD) WITH PROPOFOL ;  Surgeon: Toledo, Ladell POUR, MD;  Location: [facility] ENDOSCOPY;  Service: Gastroenterology;  Laterality: N/A;  . SCS 8/ 2021    . SHOULDER SURGERY Left 07-1999, 07-2011   x 4     Current Outpatient Medications  Medication Sig Dispense Refill  . Accu-Chek FastClix Lancets MISC TEST TWO TIMES DAILY 204 each 3  . ACCU-CHEK GUIDE TEST test strip  TEST BLOOD SUGAR TWO TIMES DAILY 200 strip 3  . Alcohol Swabs (DROPSAFE ALCOHOL PREP) 70 % PADS USE TWO TIMES DAILY 200 each 3  . amLODipine (NORVASC) 10 MG tablet Take 1 tablet (10 mg total) by mouth daily. 30 tablet 11  . Blood Glucose Monitoring Suppl (ACCU-CHEK GUIDE) w/Device KIT Inject 1 Device into the skin See admin instructions. 1 kit 0  . Buprenorphine  HCl (BELBUCA ) 150 MCG FILM Place 1 Film inside cheek every 12 (twelve) hours. 60 each 5  . carvedilol (COREG) 6.25 MG tablet Take 6.25 mg by mouth 2 (two) times daily with a meal.    . Evolocumab  (REPATHA  SURECLICK) 140 MG/ML SOAJ Inject 140 mg into the skin every 14 (fourteen) days. 6 mL 2  . fluticasone (FLONASE) 50 MCG/ACT nasal spray USE 1 SPRAY NASALLY EVERY DAY 32 g 3  . Glucagon  (GVOKE HYPOPEN  2-PACK) 0.5 MG/0.1ML SOAJ Inject 0.5 mg as directed daily. (Patient taking differently: Inject 0.5 mg as directed as needed.) 0.4 mL 5  . Lancets Misc. (ACCU-CHEK FASTCLIX LANCET) KIT USE AS DIRECTED 1 kit 3  . lubiprostone (AMITIZA) 24 MCG capsule Take 24 mcg by mouth 2 (two) times daily.    SABRA mexiletine (MEXITIL) 150 MG capsule Take 150 mg by mouth 3 (three) times daily.    . QUEtiapine (SEROQUEL) 25 MG tablet Take 25 mg by mouth as needed.    SABRA  triamcinolone  cream (KENALOG ) 0.1 % APPLY TO AFFECTED AREA AS DIRECTED UP TO TWICE DAILY 90 g 3  . valACYclovir  (VALTREX ) 500 MG tablet TAKE 1 TABLET EVERY DAY 90 tablet 3  . atorvastatin (LIPITOR) 20 MG tablet Take 1 tablet (20 mg total) by mouth at bedtime. 100 tablet 3  . Vitamin D , Ergocalciferol , (DRISDOL ) 1.25 MG (50000 UNIT) CAPS capsule Take 1 capsule (50,000 Units total) by mouth once a week. (Patient not taking: Reported on 01/03/2024) 12 capsule 1   No current facility-administered medications for this visit.    Allergies:   Phenergan [promethazine hcl], Codeine, Cortisone, Lidocaine , Acetaminophen, and Oxycodone    Social History:   reports that she has never smoked. She has never  used smokeless tobacco. She reports that she does not drink alcohol and does not use drugs.   Family History:  family history includes Breast cancer in her maternal aunt; Diabetes in her father; Hyperlipidemia in her father; Hypertension in her father.    ROS:     Review of Systems  Constitutional: Negative.   HENT: Negative.    Eyes: Negative.   Respiratory: Negative.    Gastrointestinal: Negative.   Genitourinary: Negative.   Musculoskeletal: Negative.   Skin: Negative.   Neurological: Negative.   Endo/Heme/Allergies: Negative.   Psychiatric/Behavioral: Negative.    All other systems reviewed and are negative.     All other systems are reviewed and negative.    PHYSICAL EXAM: VS:  BP 116/82   Pulse 81   Ht 5' 8 (1.727 m)   Wt 243 lb 12.8 oz (110.6 kg)   SpO2 97%   BMI 37.07 kg/m  , BMI Body mass index is 37.07 kg/m. Last weight:  Wt Readings from Last 3 Encounters:  01/03/24 243 lb 12.8 oz (110.6 kg)  12/14/23 242 lb (109.8 kg)  10/29/23 240 lb 12.8 oz (109.2 kg)     Physical Exam Constitutional:      Appearance: Normal appearance.   Cardiovascular:     Rate and Rhythm: Normal rate and regular rhythm.     Heart sounds: Normal heart sounds.  Pulmonary:     Effort: Pulmonary effort is normal.     Breath sounds: Normal breath sounds.   Musculoskeletal:     Right lower leg: No edema.     Left lower leg: No edema.   Neurological:     Mental Status: She is alert.      EKG:   Recent Labs: 02/25/2023: ALT 11; BUN 12; Creatinine, Ser 0.64; Potassium 5.3; Sodium 145    Lipid Panel    Component Value Date/Time   CHOL 198 10/25/2023 0931   TRIG 134 10/25/2023 0931   HDL 61 10/25/2023 0931   CHOLHDL 3.2 10/25/2023 0931   LDLCALC 113 (H) 10/25/2023 0931      Other studies Reviewed: Additional studies/ records that were reviewed today include:  Review of the above records demonstrates:       No data to display            ASSESSMENT AND  PLAN:    ICD-10-CM   1. Primary hypertension  I10 atorvastatin (LIPITOR) 20 MG tablet    amLODipine (NORVASC) 10 MG tablet   Increase dose of amlodapine to 10 mg as home BP high with headache    2. 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    3. SOB (shortness of breath)  R06.02 atorvastatin (LIPITOR) 20 MG tablet    amLODipine (NORVASC) 10 MG tablet    4. Nonrheumatic mitral valve regurgitation  I34.0 atorvastatin (LIPITOR) 20 MG tablet    amLODipine (NORVASC) 10 MG tablet   mild on echo    5. Chest pain, non-cardiac  R07.89 atorvastatin (LIPITOR) 20 MG tablet    amLODipine (NORVASC) 10 MG tablet   infrequent       Problem List Items Addressed This Visit       Other   Mixed hyperlipidemia   Relevant Medications   atorvastatin (LIPITOR) 20 MG tablet   amLODipine (NORVASC) 10 MG tablet   Other Visit Diagnoses       Primary hypertension    -  Primary   Increase dose of amlodapine to 10 mg as home BP high with headache   Relevant Medications   atorvastatin (LIPITOR) 20 MG tablet   amLODipine (NORVASC) 10 MG tablet     SOB (shortness of breath)       Relevant Medications   atorvastatin (LIPITOR) 20 MG tablet   amLODipine (NORVASC) 10 MG tablet     Nonrheumatic mitral valve regurgitation       mild on echo   Relevant Medications   atorvastatin (LIPITOR) 20 MG tablet   amLODipine (NORVASC) 10 MG tablet     Chest pain, non-cardiac       infrequent   Relevant Medications   atorvastatin (LIPITOR) 20 MG tablet   amLODipine (NORVASC) 10 MG tablet          Disposition:   Return in about 3 months (around 04/04/2024).    Total time spent: 30 minutes  Signed,  Denyse Bathe, MD  01/03/2024 3:09 PM    [facility]

## 2024-01-05 ENCOUNTER — Ambulatory Visit: Payer: Self-pay | Admitting: Family

## 2024-01-05 DIAGNOSIS — E278 Other specified disorders of adrenal gland: Secondary | ICD-10-CM | POA: Diagnosis not present

## 2024-01-05 DIAGNOSIS — E119 Type 2 diabetes mellitus without complications: Secondary | ICD-10-CM | POA: Diagnosis not present

## 2024-01-05 DIAGNOSIS — S8012XA Contusion of left lower leg, initial encounter: Secondary | ICD-10-CM | POA: Diagnosis not present

## 2024-01-05 DIAGNOSIS — S9032XA Contusion of left foot, initial encounter: Secondary | ICD-10-CM | POA: Diagnosis not present

## 2024-01-05 DIAGNOSIS — N182 Chronic kidney disease, stage 2 (mild): Secondary | ICD-10-CM | POA: Diagnosis not present

## 2024-01-05 DIAGNOSIS — E875 Hyperkalemia: Secondary | ICD-10-CM | POA: Diagnosis not present

## 2024-01-05 DIAGNOSIS — I10 Essential (primary) hypertension: Secondary | ICD-10-CM | POA: Diagnosis not present

## 2024-01-05 DIAGNOSIS — I1A0 Resistant hypertension: Secondary | ICD-10-CM | POA: Diagnosis not present

## 2024-01-06 NOTE — Progress Notes (Signed)
Pt informed

## 2024-01-10 ENCOUNTER — Ambulatory Visit: Attending: Nurse Practitioner | Admitting: Nurse Practitioner

## 2024-01-10 ENCOUNTER — Encounter: Payer: Self-pay | Admitting: Nurse Practitioner

## 2024-01-10 VITALS — BP 139/99 | HR 92 | Temp 99.0°F | Resp 18 | Ht 68.0 in | Wt 237.0 lb

## 2024-01-10 DIAGNOSIS — M19021 Primary osteoarthritis, right elbow: Secondary | ICD-10-CM | POA: Insufficient documentation

## 2024-01-10 DIAGNOSIS — G894 Chronic pain syndrome: Secondary | ICD-10-CM | POA: Insufficient documentation

## 2024-01-10 DIAGNOSIS — M48062 Spinal stenosis, lumbar region with neurogenic claudication: Secondary | ICD-10-CM | POA: Diagnosis not present

## 2024-01-10 DIAGNOSIS — G7111 Myotonic muscular dystrophy: Secondary | ICD-10-CM | POA: Diagnosis not present

## 2024-01-10 DIAGNOSIS — M5416 Radiculopathy, lumbar region: Secondary | ICD-10-CM | POA: Diagnosis not present

## 2024-01-10 DIAGNOSIS — E7402 Pompe disease: Secondary | ICD-10-CM | POA: Insufficient documentation

## 2024-01-10 DIAGNOSIS — M5412 Radiculopathy, cervical region: Secondary | ICD-10-CM | POA: Diagnosis not present

## 2024-01-10 MED ORDER — BELBUCA 150 MCG BU FILM: 1.0000 | ORAL_FILM | Freq: Two times a day (BID) | BUCCAL | 5 refills | Status: DC

## 2024-01-10 NOTE — Progress Notes (Signed)
 PROVIDER NOTE: Interpretation of information contained herein should be left to medically-trained personnel. Specific patient instructions are provided elsewhere under Patient Instructions section of medical record. This document was created in part using AI and STT-dictation technology, any transcriptional errors that may result from this process are unintentional.  Patient: Vickie Stevens  Service: E/M   PCP: Albina GORMAN Dine, MD  DOB: 1970-10-18  DOS: 01/10/2024  Provider: Emmy MARLA Blanch, NP  MRN: [id number]  Delivery: Face-to-face  Specialty: Interventional Pain Management  Type: Established Patient  Setting: Ambulatory outpatient facility  Specialty designation: 09  Referring Prov.: Albina GORMAN Dine, MD  Location: Outpatient office facility       History of present illness (HPI) Ms. YUNUEN MORDAN, a 53 y.o. year old female, is here today because of her Chronic pain syndrome [G89.4]. Ms. Dix primary complain today is Hip Pain  Pertinent problems: Ms. Silvernail has DDD (degenerative disc disease), cervical; Rotator cuff (capsule) sprain Tear of rotator cuff; lumbar and sacral arthritis; Bilateral occipital neurologia; Myotonic muscular dystrophy (HCC); Myotonic dystrophy (HCC), and Chronic pain syndrome on their pertinent problem list.  Pain Assessment: Severity of Chronic pain is reported as a 6 /10. Location: Hip Left, Right/Radiated down left side, and leg Left kneck down shoulder. Onset: More than a month ago. Quality: Aching, Constant. Timing: Constant. Modifying factor(s): TENS, Heat, Medication. Vitals:  height is 5' 8 (1.727 m) and weight is 237 lb (107.5 kg). Her temperature is 99 F (37.2 C). Her blood pressure is 139/99 (abnormal) and her pulse is 92. Her respiration is 18 and oxygen saturation is 99%.  BMI: Estimated body mass index is 36.04 kg/m as calculated from the following:   Height as of this encounter: 5' 8 (1.727 m).   Weight as of this encounter: 237 lb (107.5  kg).  Last encounter: Visit date not found. Last procedure: Visit date not found.  Reason for encounter: medication management. No change in medical history since last visit.  Patient's pain is at baseline.  Patient continues multimodal pain regimen as prescribed.  States that it provides pain relief and improvement in functional status.  Pharmacotherapy Assessment   Belbuca  150 mcg film 1 film inside check every 12 hours as needed for pain. Monitoring: [place] PMP: PDMP reviewed during this encounter.       Pharmacotherapy: No side-effects or adverse reactions reported. Compliance: No problems identified. Effectiveness: Clinically acceptable.  Erlene Doyal SAUNDERS, [place]  01/10/2024 11:27 AM  Sign when Signing Visit Nursing Pain Medication Assessment:  Safety precautions to be maintained throughout the outpatient stay will include: orient to surroundings, keep bed in low position, maintain call bell within reach at all times, provide assistance with transfer out of bed and ambulation.  Medication Inspection Compliance: Pill count conducted under aseptic conditions, in front of the patient. Neither the pills nor the bottle was removed from the patient's sight at any time. Once count was completed pills were immediately returned to the patient in their original bottle.  Medication: Belbuca  Pill/Patch Count: 3 of 60 pills/patches remain Pill/Patch Appearance: Markings consistent with prescribed medication Bottle Appearance: Standard pharmacy container. Clearly labeled. Filled Date: 06 / 10 / 2025 Last Medication intake:  Today    UDS:  Summary  Date Value Ref Range Status  03/05/2022 Note  Final    Comment:    ==================================================================== [employer] Select 13 (MW) ==================================================================== Test  Result       Flag       Units  Drug Present and Declared for Prescription Verification    Buprenorphine                   4            EXPECTED   ng/mg creat   Norbuprenorphine               14           EXPECTED   ng/mg creat    Source of buprenorphine  is a scheduled prescription medication.    Norbuprenorphine is an expected metabolite of buprenorphine .  ==================================================================== Test                      Result    Flag   Units      Ref Range   Creatinine              190              mg/dL      >=79 ==================================================================== Declared Medications:  The flagging and interpretation on this report are based on the  following declared medications.  Unexpected results may arise from  inaccuracies in the declared medications.   **Note: The testing scope of this panel does not include small to  moderate amounts of these reported medications:   Buprenorphine  (Belbuca )   **Note: The testing scope of this panel does not include the  following reported medications:   Amlodipine  (Norvasc )  Atorvastatin  (Lipitor) ==================================================================== For clinical consultation, please call [phone]. ====================================================================     No results found for: CBDTHCR No results found for: D8THCCBX No results found for: D9THCCBX  ROS  Constitutional: Denies any fever or chills Gastrointestinal: No reported hemesis, hematochezia, vomiting, or acute GI distress Musculoskeletal: Left hip pain Neurological: No reported episodes of acute onset apraxia, aphasia, dysarthria, agnosia, amnesia, paralysis, loss of coordination, or loss of consciousness  Medication Review  Accu-Chek FastClix Lancet, Accu-Chek FastClix Lancets, Accu-Chek Guide, Buprenorphine  HCl, DropSafe Alcohol Prep, Evolocumab , Glucagon , QUEtiapine, Vitamin D  (Ergocalciferol ), amLODipine , atorvastatin , carvedilol, fluticasone, glucose blood, lubiprostone,  mexiletine, triamcinolone  cream, and valACYclovir   History Review  Allergy: Ms. Capek is allergic to phenergan [promethazine hcl], codeine, cortisone, lidocaine , acetaminophen, and oxycodone. Drug: Ms. Blackwelder  reports no history of drug use. Alcohol:  reports no history of alcohol use. Tobacco:  reports that she has never smoked. She has never used smokeless tobacco. Social: Ms. Gal  reports that she has never smoked. She has never used smokeless tobacco. She reports that she does not drink alcohol and does not use drugs. Medical:  has a past medical history of ADHD (attention deficit hyperactivity disorder), Biceps tendonitis, Bipolar disorder (HCC), Chronic back pain, Cluster headache, Crohn's disease (HCC), DDD (degenerative disc disease), lumbar, Depression, Diabetes mellitus without complication (HCC), Hypercholesteremia, Hyperkalemia, Hypertension, Lumbar stenosis, Myotonic dystrophy (HCC), PCOS (polycystic ovarian syndrome), Rotator cuff tendinitis, Sleep apnea, and Tuberous sclerosis (HCC). Surgical: Ms. Clary  has a past surgical history that includes Endometrial ablation (01-2011); Shoulder surgery (Left, 07-1999, 07-2011); Cataract extraction, bilateral; Esophagogastroduodenoscopy (egd) with propofol  (N/A, 06/08/2018); Colonoscopy with propofol  (N/A, 06/08/2018); SCS 8/ 2021; and Esophagogastroduodenoscopy (egd) with propofol  (N/A, 05/12/2023). Family: family history includes Breast cancer in her maternal aunt; Diabetes in her father; Hyperlipidemia in her father; Hypertension in her father.  Laboratory Chemistry Profile   Renal Lab Results  Component Value Date   BUN 12 02/25/2023  CREATININE 0.64 02/25/2023   BCR 19 02/25/2023   GFRAA >60 12/08/2017   GFRNONAA >60 12/08/2017    Hepatic Lab Results  Component Value Date   AST 14 02/25/2023   ALT 11 02/25/2023   ALBUMIN 3.8 02/25/2023   ALKPHOS 122 (H) 02/25/2023    Electrolytes Lab Results  Component Value Date   NA  145 (H) 02/25/2023   K 5.3 (H) 02/25/2023   CL 106 02/25/2023   CALCIUM  9.5 02/25/2023    Bone No results found for: VD25OH, VD125OH2TOT, CI6874NY7, CI7874NY7, 25OHVITD1, 25OHVITD2, 25OHVITD3, TESTOFREE, TESTOSTERONE  Inflammation (CRP: Acute Phase) (ESR: Chronic Phase) No results found for: CRP, ESRSEDRATE, LATICACIDVEN       Note: Above Lab results reviewed.  Recent Imaging Review  MM 3D SCREENING MAMMOGRAM BILATERAL BREAST CLINICAL DATA:  Screening.  EXAM: DIGITAL SCREENING BILATERAL MAMMOGRAM WITH TOMOSYNTHESIS AND CAD  TECHNIQUE: Bilateral screening digital craniocaudal and mediolateral oblique mammograms were obtained. Bilateral screening digital breast tomosynthesis was performed. The images were evaluated with computer-aided detection.  COMPARISON:  Previous exam(s).  ACR Breast Density Category c: The breasts are heterogeneously dense, which may obscure small masses.  FINDINGS: There are no findings suspicious for malignancy.  IMPRESSION: No mammographic evidence of malignancy. A result letter of this screening mammogram will be mailed directly to the patient.  RECOMMENDATION: Screening mammogram in one year. (Code:[id number]-B-01Y)  BI-RADS CATEGORY  1: Negative.  Electronically Signed   By: Alm Parkins M.D.   On: 12/15/2023 12:57 Note: Reviewed        Physical Exam  Vitals: BP (!) 139/99   Pulse 92   Temp 99 F (37.2 C)   Resp 18   Ht 5' 8 (1.727 m)   Wt 237 lb (107.5 kg)   SpO2 99%   BMI 36.04 kg/m  BMI: Estimated body mass index is 36.04 kg/m as calculated from the following:   Height as of this encounter: 5' 8 (1.727 m).   Weight as of this encounter: 237 lb (107.5 kg). Ideal: Ideal body weight: 63.9 kg (140 lb 14 oz) Adjusted ideal body weight: 81.3 kg (179 lb 5.2 oz) General appearance: Well nourished, well developed, and well hydrated. In no apparent acute distress Mental status: Alert, oriented x 3 (person, place,  & time)       Respiratory: No evidence of acute respiratory distress Eyes: PERLA   Assessment   Diagnosis Status  1. Chronic pain syndrome   2. Myotonic muscular dystrophy (HCC)   3. Lumbar radiculopathy   4. Pompe disease (HCC)   5. Spinal stenosis, lumbar region, with neurogenic claudication   6. Primary osteoarthritis of right elbow   7. Cervical radicular pain    Controlled Controlled Controlled   Updated Problems: No problems updated.  Plan of Care  Problem-specific:  Assessment and Plan We will continue on current medication regimen.  Prescribing drug monitoring (PDMP) reviewed; findings consistent with the use of prescribed medication and no evidence of narcotic misuse or abuse.  Schedule follow-up in 45-month for medication management with Daven Montz, NP.  No other new issues or problems reported to this visit.  Ms. SHANAYA SCHNECK has a current medication list which includes the following long-term medication(s): amlodipine , atorvastatin , carvedilol, fluticasone, and mexiletine.  Pharmacotherapy (Medications Ordered): Meds ordered this encounter  Medications   Buprenorphine  HCl (BELBUCA ) 150 MCG FILM    Sig: Place 1 Film inside cheek every 12 (twelve) hours.    Dispense:  60 each    Refill:  5    For chronic pain syndrome   Orders:  No orders of the defined types were placed in this encounter.       Return in about 6 months (around 07/12/2024) for (F2F), (MM), Emmy Blanch NP.    Recent Visits No visits were found meeting these conditions. Showing recent visits within past 90 days and meeting all other requirements Today's Visits Date Type Provider Dept  01/10/24 Office Visit Jaylinn Hellenbrand K, NP [facility]  Showing today's visits and meeting all other requirements Future Appointments No visits were found meeting these conditions. Showing future appointments within next 90 days and meeting all other requirements  I discussed the assessment and  treatment plan with the patient. The patient was provided an opportunity to ask questions and all were answered. The patient agreed with the plan and demonstrated an understanding of the instructions.  Patient advised to call back or seek an in-person evaluation if the symptoms or condition worsens.  Duration of encounter: 30 minutes.  Total time on encounter, as per AMA guidelines included both the face-to-face and non-face-to-face time personally spent by the physician and/or other qualified health care professional(s) on the day of the encounter (includes time in activities that require the physician or other qualified health care professional and does not include time in activities normally performed by clinical staff). Physician's time may include the following activities when performed: Preparing to see the patient (e.g., pre-charting review of records, searching for previously ordered imaging, lab work, and nerve conduction tests) Review of prior analgesic pharmacotherapies. Reviewing PMP Interpreting ordered tests (e.g., lab work, imaging, nerve conduction tests) Performing post-procedure evaluations, including interpretation of diagnostic procedures Obtaining and/or reviewing separately obtained history Performing a medically appropriate examination and/or evaluation Counseling and educating the patient/family/caregiver Ordering medications, tests, or procedures Referring and communicating with other health care professionals (when not separately reported) Documenting clinical information in the electronic or other health record Independently interpreting results (not separately reported) and communicating results to the patient/ family/caregiver Care coordination (not separately reported)  Note by: Annalaura Sauseda K Shadie Sweatman, NP (TTS and AI technology used. I apologize for any typographical errors that were not detected and corrected.) Date: 01/10/2024; Time: 12:40 PM

## 2024-01-10 NOTE — Progress Notes (Signed)
 Nursing Pain Medication Assessment:  Safety precautions to be maintained throughout the outpatient stay will include: orient to surroundings, keep bed in low position, maintain call bell within reach at all times, provide assistance with transfer out of bed and ambulation.  Medication Inspection Compliance: Pill count conducted under aseptic conditions, in front of the patient. Neither the pills nor the bottle was removed from the patient's sight at any time. Once count was completed pills were immediately returned to the patient in their original bottle.  Medication: Belbuca  Pill/Patch Count: 3 of 60 pills/patches remain Pill/Patch Appearance: Markings consistent with prescribed medication Bottle Appearance: Standard pharmacy container. Clearly labeled. Filled Date: 06 / 10 / 2025 Last Medication intake:  Today

## 2024-01-31 ENCOUNTER — Other Ambulatory Visit

## 2024-01-31 DIAGNOSIS — M4726 Other spondylosis with radiculopathy, lumbar region: Secondary | ICD-10-CM | POA: Diagnosis not present

## 2024-01-31 DIAGNOSIS — E782 Mixed hyperlipidemia: Secondary | ICD-10-CM

## 2024-01-31 DIAGNOSIS — E119 Type 2 diabetes mellitus without complications: Secondary | ICD-10-CM | POA: Diagnosis not present

## 2024-01-31 DIAGNOSIS — M48061 Spinal stenosis, lumbar region without neurogenic claudication: Secondary | ICD-10-CM | POA: Diagnosis not present

## 2024-01-31 DIAGNOSIS — M4316 Spondylolisthesis, lumbar region: Secondary | ICD-10-CM | POA: Diagnosis not present

## 2024-02-01 LAB — LIPID PANEL
Chol/HDL Ratio: 2.9 ratio (ref 0.0–4.4)
Cholesterol, Total: 187 mg/dL (ref 100–199)
HDL: 65 mg/dL (ref >39)
LDL Chol Calc (NIH): 98 mg/dL (ref 0–99)
Triglycerides: 137 mg/dL (ref 0–149)
VLDL Cholesterol Cal: 24 mg/dL (ref 5–40)

## 2024-02-01 LAB — HEMOGLOBIN A1C
Est. average glucose Bld gHb Est-mCnc: 134 mg/dL
Hgb A1c MFr Bld: 6.3 % — ABNORMAL HIGH (ref 4.8–5.6)

## 2024-02-02 ENCOUNTER — Ambulatory Visit (INDEPENDENT_AMBULATORY_CARE_PROVIDER_SITE_OTHER): Admitting: Internal Medicine

## 2024-02-02 ENCOUNTER — Ambulatory Visit: Admitting: Internal Medicine

## 2024-02-02 ENCOUNTER — Ambulatory Visit: Payer: Self-pay | Admitting: Internal Medicine

## 2024-02-02 VITALS — BP 118/86 | HR 81 | Ht 68.0 in | Wt 239.4 lb

## 2024-02-02 DIAGNOSIS — I10 Essential (primary) hypertension: Secondary | ICD-10-CM

## 2024-02-02 DIAGNOSIS — E782 Mixed hyperlipidemia: Secondary | ICD-10-CM

## 2024-02-02 DIAGNOSIS — E119 Type 2 diabetes mellitus without complications: Secondary | ICD-10-CM | POA: Diagnosis not present

## 2024-02-02 NOTE — Progress Notes (Signed)
 Established Patient Office Visit  Subjective:  Patient ID: Vickie Stevens, female    DOB: 09-May-1971  Age: 53 y.o. MRN: [id number]  No chief complaint on file.   No new complaints, here for lab review and medication refills. Labs reviewed and notable for well controlled diabetes, A1c at target, lipids at target with unremarkable cmp. Admits to single hypoglycemic episode and home bg readings have been at target.     No other concerns at this time.   Past Medical History:  Diagnosis Date   ADHD (attention deficit hyperactivity disorder)    Biceps tendonitis    Bipolar disorder (HCC)    Chronic back pain    Cluster headache    Crohn'Avah Bashor disease (HCC)    DDD (degenerative disc disease), lumbar    Depression    Diabetes mellitus without complication (HCC)    Hypercholesteremia    Hyperkalemia    Hypertension    Lumbar stenosis    Myotonic dystrophy (HCC)    PCOS (polycystic ovarian syndrome)    Rotator cuff tendinitis    Sleep apnea    Tuberous sclerosis (HCC)     Past Surgical History:  Procedure Laterality Date   CATARACT EXTRACTION, BILATERAL     COLONOSCOPY WITH PROPOFOL  N/A 06/08/2018   Procedure: COLONOSCOPY WITH PROPOFOL ;  Surgeon: Gaylyn Gladis PENNER, MD;  Location: [facility] ENDOSCOPY;  Service: Endoscopy;  Laterality: N/A;   ENDOMETRIAL ABLATION  01-2011   ESOPHAGOGASTRODUODENOSCOPY (EGD) WITH PROPOFOL  N/A 06/08/2018   Procedure: ESOPHAGOGASTRODUODENOSCOPY (EGD) WITH PROPOFOL ;  Surgeon: Gaylyn Gladis PENNER, MD;  Location: [facility] ENDOSCOPY;  Service: Endoscopy;  Laterality: N/A;   ESOPHAGOGASTRODUODENOSCOPY (EGD) WITH PROPOFOL  N/A 05/12/2023   Procedure: ESOPHAGOGASTRODUODENOSCOPY (EGD) WITH PROPOFOL ;  Surgeon: Toledo, Ladell POUR, MD;  Location: [facility] ENDOSCOPY;  Service: Gastroenterology;  Laterality: N/A;   SCS 8/ 2021     SHOULDER SURGERY Left 07-1999, 07-2011   x 4    Social History   Socioeconomic History   Marital status: Divorced    Spouse name: Not on file    Number of children: Not on file   Years of education: Not on file   Highest education level: Not on file  Occupational History   Not on file  Tobacco Use   Smoking status: Never   Smokeless tobacco: Never  Vaping Use   Vaping status: Never Used  Substance and Sexual Activity   Alcohol use: No   Drug use: Never   Sexual activity: Not on file  Other Topics Concern   Not on file  Social History Narrative   Not on file   Social Drivers of Health   Financial Resource Strain: Medium Risk (12/16/2022)   Received from [facility]   Overall Financial Resource Strain (CARDIA)    Difficulty of Paying Living Expenses: Somewhat hard  Food Insecurity: No Food Insecurity (12/16/2022)   Received from [facility]   Hunger Vital Sign    Within the past 12 months, you worried that your food would run out before you got the money to buy more.: Never true    Within the past 12 months, the food you bought just didn't last and you didn't have money to get more.: Never true  Transportation Needs: Unmet Transportation Needs (12/16/2022)   Received from [facility] - Transportation    In the past 12 months, has lack of transportation kept you from medical appointments or from getting medications?: Yes    Lack of  Transportation (Non-Medical): Yes  Physical Activity: Insufficiently Active (04/09/2021)   Received from [facility]   Exercise Vital Sign    On average, how many days per week do you engage in moderate to strenuous exercise (like a brisk walk)?: 1 day    On average, how many minutes do you engage in exercise at this level?: 30 min  Stress: Stress Concern Present (04/09/2021)   Received from [facility] - Occupational Stress Questionnaire    Feeling of Stress : Very much  Social Connections: Unknown (03/27/2023)   Received from [facility]   Social  Network    Social Network: Not on file  Intimate Partner Violence: Not At Risk (03/27/2023)   Received from [facility]   HITS    Over the last 12 months how often did your partner physically hurt you?: Never    Over the last 12 months how often did your partner insult you or talk down to you?: Never    Over the last 12 months how often did your partner threaten you with physical harm?: Never    Over the last 12 months how often did your partner scream or curse at you?: Never    Family History  Problem Relation Age of Onset   Diabetes Father    Hyperlipidemia Father    Hypertension Father    Breast cancer Maternal Aunt     Allergies  Allergen Reactions   Phenergan [Promethazine Hcl] Other (See Comments)    Altered mental status; patient remarked that she ripped out her IV and tore up the hospital room   Codeine Hives   Cortisone Other (See Comments)    Steroid injections may have contributed to cataracts   Lidocaine  Hives    With oral, only   Acetaminophen Rash   Oxycodone Diarrhea and Rash    Pruritic rash, also    Outpatient Medications Prior to Visit  Medication Sig   Accu-Chek FastClix Lancets MISC TEST TWO TIMES DAILY   ACCU-CHEK GUIDE TEST test strip TEST BLOOD SUGAR TWO TIMES DAILY   Alcohol Swabs (DROPSAFE ALCOHOL PREP) 70 % PADS USE TWO TIMES DAILY   amLODipine  (NORVASC ) 10 MG tablet Take 1 tablet (10 mg total) by mouth daily.   atorvastatin  (LIPITOR) 20 MG tablet Take 1 tablet (20 mg total) by mouth at bedtime.   Blood Glucose Monitoring Suppl (ACCU-CHEK GUIDE) w/Device KIT Inject 1 Device into the skin See admin instructions.   Buprenorphine  HCl (BELBUCA ) 150 MCG FILM Place 1 Film inside cheek every 12 (twelve) hours.   carvedilol (COREG) 6.25 MG tablet Take 6.25 mg by mouth 2 (two) times daily with a meal.   Evolocumab  (REPATHA  SURECLICK) 140 MG/ML SOAJ Inject 140 mg into the skin every 14 (fourteen) days.   fluticasone (FLONASE) 50 MCG/ACT nasal spray USE 1  SPRAY NASALLY EVERY DAY   Glucagon  (GVOKE HYPOPEN  2-PACK) 0.5 MG/0.1ML SOAJ Inject 0.5 mg as directed daily. (Patient taking differently: Inject 0.5 mg as directed as needed.)   Lancets Misc. (ACCU-CHEK FASTCLIX LANCET) KIT USE AS DIRECTED   lubiprostone (AMITIZA) 24 MCG capsule Take 24 mcg by mouth 2 (two) times daily.   mexiletine (MEXITIL) 150 MG capsule Take 150 mg by mouth 3 (three) times daily.   QUEtiapine (SEROQUEL) 25 MG tablet Take 25 mg by mouth as needed.   triamcinolone  cream (KENALOG ) 0.1 % APPLY TO AFFECTED AREA AS DIRECTED UP TO TWICE DAILY   valACYclovir  (  VALTREX ) 500 MG tablet TAKE 1 TABLET EVERY DAY   Vitamin D , Ergocalciferol , (DRISDOL ) 1.25 MG (50000 UNIT) CAPS capsule Take 1 capsule (50,000 Units total) by mouth once a week.   No facility-administered medications prior to visit.    Review of Systems  Constitutional: Negative.  Negative for weight loss (gained 2 lb).       Gained weight  HENT: Negative.    Eyes: Negative.   Respiratory: Negative.    Cardiovascular: Negative.   Gastrointestinal: Negative.   Genitourinary: Negative.   Skin: Negative.   Neurological: Negative.   Endo/Heme/Allergies: Negative.        Objective:   BP 118/86   Pulse 81   Ht 5' 8 (1.727 m)   Wt 239 lb 6.4 oz (108.6 kg)   SpO2 98%   BMI 36.40 kg/m   Vitals:   02/02/24 1058  BP: 118/86  Pulse: 81  Height: 5' 8 (1.727 m)  Weight: 239 lb 6.4 oz (108.6 kg)  SpO2: 98%  BMI (Calculated): 36.41    Physical Exam Vitals reviewed.  Constitutional:      General: She is not in acute distress.    Appearance: She is obese.  HENT:     Head: Normocephalic.     Nose: Nose normal.     Mouth/Throat:     Mouth: Mucous membranes are moist.  Eyes:     Extraocular Movements: Extraocular movements intact.     Pupils: Pupils are equal, round, and reactive to light.  Cardiovascular:     Rate and Rhythm: Normal rate and regular rhythm.     Heart sounds: No murmur heard. Pulmonary:      Effort: Pulmonary effort is normal.     Breath sounds: No rhonchi or rales.  Abdominal:     General: Abdomen is flat.     Palpations: There is no hepatomegaly, splenomegaly or mass.  Musculoskeletal:        General: Normal range of motion.     Cervical back: Normal range of motion. No tenderness.  Skin:    General: Skin is warm and dry.  Neurological:     General: No focal deficit present.     Mental Status: She is alert and oriented to person, place, and time.     Cranial Nerves: No cranial nerve deficit.     Motor: No weakness.  Psychiatric:        Mood and Affect: Mood normal.        Behavior: Behavior normal.      No results found for any visits on 02/02/24.  Lab Results  Component Value Date   HGBA1C 6.3 (H) 01/31/2024   HGBA1C 6.6 (H) 10/25/2023   HGBA1C 6.5 (H) 02/25/2023    Lipid Panel     Component Value Date/Time   CHOL 187 01/31/2024 0923   TRIG 137 01/31/2024 0923   HDL 65 01/31/2024 0923   CHOLHDL 2.9 01/31/2024 0923   LDLCALC 98 01/31/2024 0923   LABVLDL 24 01/31/2024 0923       Assessment & Plan:  As per problem list. Labs released: A1c, lipids.  Problem List Items Addressed This Visit       Endocrine   Type 2 diabetes mellitus (HCC)     Other   Mixed hyperlipidemia - Primary   Other Visit Diagnoses       Primary hypertension           Return in about 3 months (around 05/04/2024) for fu with labs prior.  Total time spent: 30 minutes  Sherrill Cinderella Perry, MD  02/02/2024   This document may have been prepared by [facility] Voice Recognition software and as such may include unintentional dictation errors.

## 2024-02-04 ENCOUNTER — Encounter: Payer: Self-pay | Admitting: Internal Medicine

## 2024-02-04 ENCOUNTER — Encounter: Payer: Self-pay | Admitting: Student in an Organized Health Care Education/Training Program

## 2024-02-04 DIAGNOSIS — G7111 Myotonic muscular dystrophy: Secondary | ICD-10-CM | POA: Diagnosis not present

## 2024-02-04 DIAGNOSIS — I10 Essential (primary) hypertension: Secondary | ICD-10-CM | POA: Diagnosis not present

## 2024-02-05 ENCOUNTER — Other Ambulatory Visit: Payer: Self-pay | Admitting: Internal Medicine

## 2024-02-05 DIAGNOSIS — E119 Type 2 diabetes mellitus without complications: Secondary | ICD-10-CM | POA: Diagnosis not present

## 2024-02-05 DIAGNOSIS — G4733 Obstructive sleep apnea (adult) (pediatric): Secondary | ICD-10-CM | POA: Diagnosis not present

## 2024-02-08 ENCOUNTER — Other Ambulatory Visit: Payer: Self-pay

## 2024-02-08 MED ORDER — ACCU-CHEK FASTCLIX LANCETS MISC: 3 refills | Status: DC

## 2024-02-10 DIAGNOSIS — K5909 Other constipation: Secondary | ICD-10-CM | POA: Diagnosis not present

## 2024-02-10 DIAGNOSIS — R1310 Dysphagia, unspecified: Secondary | ICD-10-CM | POA: Diagnosis not present

## 2024-02-10 DIAGNOSIS — K219 Gastro-esophageal reflux disease without esophagitis: Secondary | ICD-10-CM | POA: Diagnosis not present

## 2024-02-10 DIAGNOSIS — K50919 Crohn's disease, unspecified, with unspecified complications: Secondary | ICD-10-CM | POA: Diagnosis not present

## 2024-02-11 ENCOUNTER — Other Ambulatory Visit: Payer: Self-pay

## 2024-02-11 ENCOUNTER — Encounter: Payer: Self-pay | Admitting: Internal Medicine

## 2024-02-11 MED ORDER — ACCU-CHEK FASTCLIX LANCETS MISC
3 refills | Status: AC
Start: 2024-02-11 — End: ?

## 2024-02-14 ENCOUNTER — Encounter: Payer: Self-pay | Admitting: Family

## 2024-02-14 NOTE — Progress Notes (Signed)
  Chief Complaint  Patient presents with   Follow-up    PAP    Subjective:     Vickie Stevens is a 53 y.o. woman who comes in today for a  pap smear only. Her most recent annual exam was on 10/29/23. Her most recent Pap smear was on 03/27/2022 and showed no abnormalities. Previous abnormal Pap smears: no. Contraception: none  The following portions of the patient's history were reviewed and updated as appropriate: allergies, current medications, past family history, past medical history, past social history, past surgical history, and problem list.  Review of Systems A comprehensive review of systems was negative.   Objective:    BP 112/84   Pulse 92   Ht 5' 8 (1.727 m)   Wt 242 lb (109.8 kg)   SpO2 97%   BMI 36.80 kg/m    Pelvic Exam: cervix normal in appearance, external genitalia normal, urethra without abnormality or discharge, and vagina normal without discharge. Pap smear obtained.   Assessment:    Screening pap smear.   Plan:    Follow up in 5 years, or as indicated by Pap results.   Total time spent: 30 minutes  ALAN CHRISTELLA ARRANT, FNP 12/14/2023

## 2024-02-14 NOTE — Patient Instructions (Signed)
Place pap smear patient instructions here.

## 2024-02-15 DIAGNOSIS — G4733 Obstructive sleep apnea (adult) (pediatric): Secondary | ICD-10-CM | POA: Diagnosis not present

## 2024-02-17 ENCOUNTER — Other Ambulatory Visit: Payer: Self-pay | Admitting: Internal Medicine

## 2024-03-20 ENCOUNTER — Encounter: Payer: Self-pay | Admitting: Nurse Practitioner

## 2024-04-03 ENCOUNTER — Telehealth: Payer: Self-pay | Admitting: Nurse Practitioner

## 2024-04-03 ENCOUNTER — Telehealth: Payer: Self-pay

## 2024-04-03 NOTE — Telephone Encounter (Signed)
 Patient states she needs PA for her meds that are due on 04-07-24. Says Dena always gets this done before the pick-up date on her meds

## 2024-04-03 NOTE — Telephone Encounter (Signed)
 PA has been done, I have communicated this with the patient on MyChart.

## 2024-04-03 NOTE — Telephone Encounter (Signed)
 Prior Auth for [place]  completed

## 2024-04-04 ENCOUNTER — Other Ambulatory Visit: Payer: Self-pay

## 2024-04-04 ENCOUNTER — Encounter: Payer: Self-pay | Admitting: Internal Medicine

## 2024-04-04 MED ORDER — ZEGALOGUE 0.6 MG/0.6ML ~~LOC~~ SOAJ: 0.6000 mg | Freq: Every day | SUBCUTANEOUS | 5 refills | Status: AC | PRN

## 2024-04-04 MED ORDER — ZEGALOGUE 0.6 MG/0.6ML ~~LOC~~ SOAJ: 0.6000 mg | Freq: Every day | SUBCUTANEOUS | 5 refills | Status: DC | PRN

## 2024-04-05 ENCOUNTER — Other Ambulatory Visit: Payer: Self-pay | Admitting: Internal Medicine

## 2024-04-06 ENCOUNTER — Other Ambulatory Visit: Payer: Self-pay | Admitting: Internal Medicine

## 2024-04-06 ENCOUNTER — Encounter: Payer: Self-pay | Admitting: Internal Medicine

## 2024-04-10 ENCOUNTER — Other Ambulatory Visit: Payer: Self-pay

## 2024-04-10 DIAGNOSIS — E119 Type 2 diabetes mellitus without complications: Secondary | ICD-10-CM

## 2024-04-10 MED ORDER — GVOKE HYPOPEN 2-PACK 0.5 MG/0.1ML ~~LOC~~ SOAJ: 0.5000 mg | Freq: Every day | SUBCUTANEOUS | 5 refills | Status: DC

## 2024-04-11 ENCOUNTER — Ambulatory Visit
Attending: Student in an Organized Health Care Education/Training Program | Admitting: Student in an Organized Health Care Education/Training Program

## 2024-04-11 VITALS — BP 123/90 | HR 78 | Temp 98.0°F | Resp 16 | Ht 69.0 in | Wt 230.0 lb

## 2024-04-11 DIAGNOSIS — M48062 Spinal stenosis, lumbar region with neurogenic claudication: Secondary | ICD-10-CM | POA: Diagnosis not present

## 2024-04-11 DIAGNOSIS — G894 Chronic pain syndrome: Secondary | ICD-10-CM | POA: Diagnosis not present

## 2024-04-11 DIAGNOSIS — M5416 Radiculopathy, lumbar region: Secondary | ICD-10-CM | POA: Diagnosis not present

## 2024-04-11 NOTE — Progress Notes (Signed)
 Safety precautions to be maintained throughout the outpatient stay will include: orient to surroundings, keep bed in low position, maintain call bell within reach at all times, provide assistance with transfer out of bed and ambulation.

## 2024-04-11 NOTE — Progress Notes (Signed)
 PROVIDER NOTE: Interpretation of information contained herein should be left to medically-trained personnel. Specific patient instructions are provided elsewhere under Patient Instructions section of medical record. This document was created in part using AI and STT-dictation technology, any transcriptional errors that may result from this process are unintentional.  Patient: Vickie Stevens  Service: E/M   PCP: Albina GORMAN Dine, MD  DOB: December 09, 1970  DOS: 04/11/2024  Provider: Wallie Sherry, MD  MRN: [id number]  Delivery: Face-to-face  Specialty: Interventional Pain Management  Type: Established Patient  Setting: Ambulatory outpatient facility  Specialty designation: 09  Referring Prov.: Albina GORMAN Dine, MD  Location: Outpatient office facility       History of present illness (HPI) [name], a 53 y.o. year old female, is here today because of her Lumbar radiculopathy [M54.16]. [name] primary complain today is Back Pain (lower)  Pertinent problems: [name] has DDD (degenerative disc disease), cervical; Rotator cuff (capsule) sprain Tear of rotator cuff; Lumbar and sacral arthritis; Bilateral occipital neuralgia; Myotonic muscular dystrophy (HCC); Myotonic dystrophy (HCC); Chronic pain syndrome; and Lumbar facet arthropathy on their pertinent problem list.  Pain Assessment: Severity of Chronic pain is reported as a 7 /10. Location: Back Lower, Right, Left/down inside of right leg to top of foot to great toe, left down back and side of leg to top of foot to great toe. Onset: More than a month ago. Quality: Aching, Burning, Numbness, Discomfort. Timing: Constant. Modifying factor(s): rest, resposition, TENS heat. Vitals:  height is 5' 9 (1.753 m) and weight is 230 lb (104.3 kg). Her temperature is 98 F (36.7 C). Her blood pressure is 123/90 (abnormal) and her pulse is 78. Her respiration is 16 and oxygen saturation is 99%.  BMI: Estimated body mass index is 33.97 kg/m as  calculated from the following:   Height as of this encounter: 5' 9 (1.753 m).   Weight as of this encounter: 230 lb (104.3 kg).  Last encounter: 07/14/2023. Last procedure: 06/07/2023.  Reason for encounter: History of Present Illness   Vickie Stevens is a 53 year old female who presents with persistent pain and numbness in the leg.  She has been experiencing ongoing pain in her hip and back for about two and a half years. Initially, she thought the pain was related to her hip, but she was told after her MRI that it may be due to her back. The pain is described as a burning sensation that radiates down her leg to her foot, causing numbness and a strong burning sensation, particularly when lying down.  She has undergone multiple injections in the past, including cortisone and bursa injections concentrated at the hip, but these have not provided lasting relief. She has not had any epidural injections for her back pain in recent years, with the last one being around 2016 or 2017.  She experiences significant discomfort when lying on her back, necessitating sleeping on her stomach to avoid pain. The pain is exacerbated by pressure on the affected side, impacting her daily activities.  She reports no current back pain and states that the Sprint improved low back pain significantly.  She is not currently on any blood thinners and has previously tolerated procedures without sedation.          No results found for: CBDTHCR No results found for: D8THCCBX No results found for: D9THCCBX  ROS  Constitutional: Denies any fever or chills Gastrointestinal: No reported hemesis, hematochezia, vomiting, or acute GI distress Musculoskeletal:  Denies any acute onset joint swelling, redness, loss of ROM, or weakness Neurological: Bilateral leg paresthesias  Medication Review  Accu-Chek FastClix Lancet, Accu-Chek FastClix Lancets, Accu-Chek Guide, Buprenorphine  HCl, Dasiglucagon  HCl,  DropSafe Alcohol Prep, Evolocumab , Glucagon , QUEtiapine, Vitamin D  (Ergocalciferol ), amLODipine , atorvastatin , carvedilol, fluticasone, glucose blood, lubiprostone, mexiletine, triamcinolone  cream, and valACYclovir   History Review  Allergy: [name] is allergic to phenergan [promethazine hcl], codeine, cortisone, lidocaine , acetaminophen, and oxycodone. Drug: [name]  reports no history of drug use. Alcohol:  reports no history of alcohol use. Tobacco:  reports that she has never smoked. She has never used smokeless tobacco. Social: [name]  reports that she has never smoked. She has never used smokeless tobacco. She reports that she does not drink alcohol and does not use drugs. Medical:  has a past medical history of ADHD (attention deficit hyperactivity disorder), Biceps tendonitis, Bipolar disorder (HCC), Chronic back pain, Cluster headache, Crohn's disease (HCC), DDD (degenerative disc disease), lumbar, Depression, Diabetes mellitus without complication (HCC), Hypercholesteremia, Hyperkalemia, Hypertension, Lumbar stenosis, Myotonic dystrophy (HCC), PCOS (polycystic ovarian syndrome), Rotator cuff tendinitis, Sleep apnea, and Tuberous sclerosis (HCC). Surgical: [name]  has a past surgical history that includes Endometrial ablation (01-2011); Shoulder surgery (Left, 07-1999, 07-2011); Cataract extraction, bilateral; Esophagogastroduodenoscopy (egd) with propofol  (N/A, 06/08/2018); Colonoscopy with propofol  (N/A, 06/08/2018); SCS 8/ 2021; and Esophagogastroduodenoscopy (egd) with propofol  (N/A, 05/12/2023). Family: family history includes Breast cancer in her maternal aunt; Diabetes in her father; Hyperlipidemia in her father; Hypertension in her father.  Laboratory Chemistry Profile   Renal Lab Results  Component Value Date   BUN 12 02/25/2023   CREATININE 0.64 02/25/2023   BCR 19 02/25/2023   GFRAA >60 12/08/2017   GFRNONAA >60 12/08/2017    Hepatic Lab Results  Component Value  Date   AST 14 02/25/2023   ALT 11 02/25/2023   ALBUMIN 3.8 02/25/2023   ALKPHOS 122 (H) 02/25/2023    Electrolytes Lab Results  Component Value Date   NA 145 (H) 02/25/2023   K 5.3 (H) 02/25/2023   CL 106 02/25/2023   CALCIUM  9.5 02/25/2023    Bone No results found for: VD25OH, VD125OH2TOT, CI6874NY7, CI7874NY7, 25OHVITD1, 25OHVITD2, 25OHVITD3, TESTOFREE, TESTOSTERONE  Inflammation (CRP: Acute Phase) (ESR: Chronic Phase) No results found for: CRP, ESRSEDRATE, LATICACIDVEN       Note: Above Lab results reviewed.  Recent Imaging Review  MM 3D SCREENING MAMMOGRAM BILATERAL BREAST CLINICAL DATA:  Screening.  EXAM: DIGITAL SCREENING BILATERAL MAMMOGRAM WITH TOMOSYNTHESIS AND CAD  TECHNIQUE: Bilateral screening digital craniocaudal and mediolateral oblique mammograms were obtained. Bilateral screening digital breast tomosynthesis was performed. The images were evaluated with computer-aided detection.  COMPARISON:  Previous exam(s).  ACR Breast Density Category c: The breasts are heterogeneously dense, which may obscure small masses.  FINDINGS: There are no findings suspicious for malignancy.  IMPRESSION: No mammographic evidence of malignancy. A result letter of this screening mammogram will be mailed directly to the patient.  RECOMMENDATION: Screening mammogram in one year. (Code:SM-B-01Y)  BI-RADS CATEGORY  1: Negative.  Electronically Signed   By: Alm Parkins M.D.   On: 12/15/2023 12:57  MRI LUMBAR SPINE WITHOUT CONTRAST, 01/31/2024 11:57 AM   INDICATION: LLE radic suspected, Myotonic muscular dystrophy    (CMD) \ G71.11 Myotonic muscular dystrophy    (CMD) \ M54.16 Radiculopathy, lumbar region   COMPARISON: None   TECHNIQUE: Multiplanar, multisequence surface-coil magnetic resonance imaging of the lumbar spine was performed without contrast.    LEVELS IMAGED: Lower thoracic region to upper  sacrum.    FINDINGS:    Alignment:  Slight retrolisthesis of L4 on L5.   Vertebrae: Vertebral body heights are maintained. No marrow signal abnormalities to suggest neoplasm.   Conus medullaris: In normal position. Normal signal and contour.   Degenerative changes:   T12-L1: No substantial canal or foraminal stenosis.  L1-L2: No substantial canal or foraminal stenosis.  L2-L3: No substantial canal or foraminal stenosis.  L3-L4: Ligamentum flavum thickening and moderate to advanced degenerative facet hypertrophy. Findings contribute to moderate spinal canal stenosis, moderate narrowing of the bilateral subarticular zones, and moderate bilateral neural foraminal stenosis.  L4-L5: Slight retrolisthesis, ligamentum flavum thickening, and moderate to advanced degenerative facet hypertrophy. Findings contribute to severe crowding of the bilateral subarticular zones with likely contact of the bilateral descending L5 nerve roots. There is also moderate spinal canal stenosis and moderate bilateral neural foraminal stenosis (left greater than right).  L5-S1: No substantial canal or foraminal stenosis.    Note: Reviewed        Physical Exam  Vitals: BP (!) 123/90   Pulse 78   Temp 98 F (36.7 C)   Resp 16   Ht 5' 9 (1.753 m)   Wt 230 lb (104.3 kg)   SpO2 99%   BMI 33.97 kg/m  BMI: Estimated body mass index is 33.97 kg/m as calculated from the following:   Height as of this encounter: 5' 9 (1.753 m).   Weight as of this encounter: 230 lb (104.3 kg). Ideal: Ideal body weight: 66.2 kg (145 lb 15.1 oz) Adjusted ideal body weight: 81.5 kg (179 lb 9.1 oz) General appearance: Well nourished, well developed, and well hydrated. In no apparent acute distress Mental status: Alert, oriented x 3 (person, place, & time)       Respiratory: No evidence of acute respiratory distress Eyes: PERLA Lumbar Spine Area Exam  Skin & Axial Inspection: No masses, redness, or swelling Alignment: Symmetrical Functional ROM: Unrestricted ROM        Stability: No instability detected Muscle Tone/Strength: Functionally intact. No obvious neuro-muscular anomalies detected. Sensory (Neurological): Dermatomal pain pattern Palpation: No palpable anomalies       Provocative Tests: Hyperextension/rotation test: (+) due to pain. Lumbar quadrant test (Kemp's test): (+) bilateral for foraminal stenosis  Gait & Posture Assessment  Ambulation: Unassisted Gait: Relatively normal for age and body habitus Posture: WNL   Lower Extremity Exam    Side: Right lower extremity  Side: Left lower extremity  Stability: No instability observed          Stability: No instability observed          Skin & Extremity Inspection: Skin color, temperature, and hair growth are WNL. No peripheral edema or cyanosis. No masses, redness, swelling, asymmetry, or associated skin lesions. No contractures.  Skin & Extremity Inspection: Skin color, temperature, and hair growth are WNL. No peripheral edema or cyanosis. No masses, redness, swelling, asymmetry, or associated skin lesions. No contractures.  Functional ROM: Pain restricted ROM                  Functional ROM: Pain restricted ROM                  Muscle Tone/Strength: Functionally intact. No obvious neuro-muscular anomalies detected.  Muscle Tone/Strength: Functionally intact. No obvious neuro-muscular anomalies detected.  Sensory (Neurological): Neurogenic pain pattern        Sensory (Neurological): Neurogenic pain pattern        DTR: Patellar: deferred today Achilles:  deferred today Plantar: deferred today  DTR: Patellar: deferred today Achilles: deferred today Plantar: deferred today  Palpation: No palpable anomalies  Palpation: No palpable anomalies    Assessment   Diagnosis  1. Lumbar radiculopathy   2. Spinal stenosis, lumbar region, with neurogenic claudication   3. Chronic pain syndrome      Updated Problems: No problems updated.  Plan of Care  Problem-specific:  Assessment and Plan     Chronic lumbar spinal stenosis with bilateral radiculopathy and lower extremity neuropathic pain, left greater than right   MRI shows moderate spinal canal stenosis with severe crowding of bilateral subarticular recess zones. She experiences burning and achy pain radiating down the legs, with numbness and burning in the foot, especially when supine. The differential diagnosis includes radicular pain from disc herniations and nerve compression. The absence of back pain suggests nerve compression rather than arthritis. Perform bilateral L4 and L5 transforaminal epidural spinal injections to determine if symptoms are spinal in origin. Schedule the procedure within the next two weeks, aiming for October 20th.       Vickie Stevens has a current medication list which includes the following long-term medication(s): amlodipine , atorvastatin , carvedilol, fluticasone, and mexiletine.  Pharmacotherapy (Medications Ordered): No orders of the defined types were placed in this encounter.  Orders:  Orders Placed This Encounter  Procedures   Lumbar Transforaminal Epidural    Standing Status:   Future    Expected Date:   04/25/2024    Expiration Date:   04/11/2025    Scheduling Instructions:     Laterality: Bilateral L4 and L5            Sedation: Patient's choice.     Timeframe: As soon as schedule allows.    Where will this procedure be performed?:   [facility]     Status post L4-L5 ESI #1 on 10/16/2019, repeat.  Left L4 Sprint peripheral nerve stimulation 02/19/2020; removed 05/08/2020., right L4 Sprint peripheral nerve stimulation 03/13/2020, lead fracture, replaced 04/15/2020, right removed 06/20/2020.  Right axillary peripheral nerve stimulation 07/29/2020.         Return in about 13 days (around 04/24/2024) for B/L L4 and L5 TF ESI, in clinic [facility].    Recent Visits No visits were found meeting these conditions. Showing recent visits within past 90 days and meeting all other  requirements Today's Visits Date Type Provider Dept  04/11/24 Office Visit Marcelino Nurse, MD [facility]  Showing today's visits and meeting all other requirements Future Appointments No visits were found meeting these conditions. Showing future appointments within next 90 days and meeting all other requirements  I discussed the assessment and treatment plan with the patient. The patient was provided an opportunity to ask questions and all were answered. The patient agreed with the plan and demonstrated an understanding of the instructions.  Patient advised to call back or seek an in-person evaluation if the symptoms or condition worsens.  I personally spent a total of in the care of the patient today including preparing to see the patient, getting/reviewing separately obtained history, performing a medically appropriate exam/evaluation, counseling and educating, placing orders, and documenting clinical information in the EHR.   Note by: Nurse Marcelino, MD (TTS and AI technology used. I apologize for any typographical errors that were not detected and corrected.) Date: 04/11/2024; Time: 9:52 AM

## 2024-04-11 NOTE — Patient Instructions (Signed)

## 2024-04-29 ENCOUNTER — Other Ambulatory Visit: Payer: Self-pay | Admitting: Internal Medicine

## 2024-04-30 ENCOUNTER — Other Ambulatory Visit: Payer: Self-pay | Admitting: Internal Medicine

## 2024-05-03 ENCOUNTER — Ambulatory Visit (HOSPITAL_BASED_OUTPATIENT_CLINIC_OR_DEPARTMENT_OTHER): Admitting: Student in an Organized Health Care Education/Training Program

## 2024-05-03 ENCOUNTER — Encounter: Payer: Self-pay | Admitting: Student in an Organized Health Care Education/Training Program

## 2024-05-03 ENCOUNTER — Ambulatory Visit
Admission: RE | Admit: 2024-05-03 | Discharge: 2024-05-03 | Disposition: A | Discharge status: Home / Self Care | Source: Ambulatory Visit | Attending: Student in an Organized Health Care Education/Training Program | Admitting: Student in an Organized Health Care Education/Training Program

## 2024-05-03 DIAGNOSIS — M5416 Radiculopathy, lumbar region: Secondary | ICD-10-CM | POA: Diagnosis not present

## 2024-05-03 DIAGNOSIS — M48062 Spinal stenosis, lumbar region with neurogenic claudication: Secondary | ICD-10-CM | POA: Diagnosis not present

## 2024-05-03 DIAGNOSIS — G894 Chronic pain syndrome: Secondary | ICD-10-CM | POA: Insufficient documentation

## 2024-05-03 MED ORDER — SODIUM CHLORIDE (PF) 0.9 % IJ SOLN
INTRAMUSCULAR | Status: AC
  Filled 2024-05-03: qty 10

## 2024-05-03 MED ORDER — IOHEXOL 180 MG/ML  SOLN
INTRAMUSCULAR | Status: AC
  Filled 2024-05-03: qty 20

## 2024-05-03 MED ORDER — LIDOCAINE HCL 2 % IJ SOLN
20.0000 mL | Freq: Once | INTRAMUSCULAR | Status: AC
  Administered 2024-05-03: 200 mg

## 2024-05-03 MED ORDER — IOHEXOL 180 MG/ML  SOLN
10.0000 mL | Freq: Once | INTRAMUSCULAR | Status: AC
  Administered 2024-05-03: 10 mL via EPIDURAL

## 2024-05-03 MED ORDER — DEXAMETHASONE SOD PHOSPHATE PF 10 MG/ML IJ SOLN
20.0000 mg | Freq: Once | INTRAMUSCULAR | Status: AC
  Administered 2024-05-03: 20 mg

## 2024-05-03 MED ORDER — ROPIVACAINE HCL 2 MG/ML IJ SOLN
INTRAMUSCULAR | Status: AC
  Filled 2024-05-03: qty 20

## 2024-05-03 MED ORDER — ROPIVACAINE HCL 2 MG/ML IJ SOLN
2.0000 mL | Freq: Once | INTRAMUSCULAR | Status: AC
  Administered 2024-05-03: 2 mL via EPIDURAL

## 2024-05-03 MED ORDER — SODIUM CHLORIDE 0.9% FLUSH
2.0000 mL | Freq: Once | INTRAVENOUS | Status: AC
  Administered 2024-05-03: 2 mL

## 2024-05-03 MED ORDER — LIDOCAINE HCL (PF) 2 % IJ SOLN
INTRAMUSCULAR | Status: AC
Start: 2024-05-03 — End: 2024-05-03
  Filled 2024-05-03: qty 10

## 2024-05-03 NOTE — Progress Notes (Signed)
 PROVIDER NOTE: Interpretation of information contained herein should be left to medically-trained personnel. Specific patient instructions are provided elsewhere under Patient Instructions section of medical record. This document was created in part using STT-dictation technology, any transcriptional errors that may result from this process are unintentional.  Patient: Vickie Stevens Type: Established DOB: 29-Mar-1971 MRN: [id number] PCP: Albina GORMAN Dine, MD  Service: Procedure DOS: 05/03/2024 Setting: Ambulatory Location: Ambulatory outpatient facility Delivery: Face-to-face Provider: Wallie Sherry, MD Specialty: Interventional Pain Management Specialty designation: 09 Location: Outpatient facility Ref. Prov.: Sherry Wallie, MD       Interventional Therapy   Procedure: Lumbar trans-foraminal epidural steroid injection (L-TFESI)  Laterality: Bilateral (-50)  Level: L4 and L5 nerve root(s) Imaging: Fluoroscopy-guided         Anesthesia: Local anesthesia (1-2% Lidocaine ) DOS: 05/03/2024  Performed by: Wallie Sherry, MD  Purpose: Diagnostic/Therapeutic Indications: Lumbar radicular pain severe enough to impact quality of life or function. 1. Lumbar radiculopathy   2. Spinal stenosis, lumbar region, with neurogenic claudication   3. Chronic pain syndrome    NAS-11 Pain score:   Pre-procedure: 6 /10   Post-procedure: 2 /10     Position / Prep / Materials:  Position: Prone  Prep solution: ChloraPrep (2% chlorhexidine gluconate and 70% isopropyl alcohol) Prep Area: Entire Posterior Lumbosacral Area.  From the lower tip of the scapula down to the tailbone and from flank to flank. Materials:  Tray: Block Needle(s):  Type: Spinal  Gauge (G): 22  Length: 5-in  Qty: 2     H&P (Pre-op Assessment):  Vickie Stevens is a 53 y.o. (year old), female patient, seen today for interventional treatment. She  has a past surgical history that includes Endometrial ablation (01-2011); Shoulder  surgery (Left, 07-1999, 07-2011); Cataract extraction, bilateral; Esophagogastroduodenoscopy (egd) with propofol  (N/A, 06/08/2018); Colonoscopy with propofol  (N/A, 06/08/2018); SCS 8/ 2021; and Esophagogastroduodenoscopy (egd) with propofol  (N/A, 05/12/2023). Vickie Stevens has a current medication list which includes the following prescription(s): accu-chek fastclix lancets, accu-chek guide test, dropsafe alcohol prep, amlodipine , atorvastatin , accu-chek guide, belbuca , carvedilol, zegalogue , repatha  sureclick, fluticasone, gvoke hypopen  2-pack, accu-chek fastclix lancet, lubiprostone, mexiletine, quetiapine, triamcinolone  cream, valacyclovir , and vitamin d  (ergocalciferol ). Her primarily concern today is the Back Pain (lower)  Initial Vital Signs:  Pulse/HCG Rate: 88ECG Heart Rate: 90 Temp: (!) 97.5 F (36.4 C) Resp: 16 BP: 120/85 SpO2: 100 %  BMI: Estimated body mass index is 34.97 kg/m as calculated from the following:   Height as of this encounter: 5' 8 (1.727 m).   Weight as of this encounter: 230 lb (104.3 kg).  Risk Assessment: Allergies: Reviewed. She is allergic to phenergan [promethazine hcl], codeine, cortisone, lidocaine , acetaminophen, and oxycodone.  Allergy Precautions: None required Coagulopathies: Reviewed. None identified.  Blood-thinner therapy: None at this time Active Infection(s): Reviewed. None identified. Vickie Stevens is afebrile  Site Confirmation: Vickie Stevens was asked to confirm the procedure and laterality before marking the site Procedure checklist: Completed Consent: Before the procedure and under the influence of no sedative(s), amnesic(s), or anxiolytics, the patient was informed of the treatment options, risks and possible complications. To fulfill our ethical and legal obligations, as recommended by the American Medical Association's Code of Ethics, I have informed the patient of my clinical impression; the nature and purpose of the treatment or procedure; the  risks, benefits, and possible complications of the intervention; the alternatives, including doing nothing; the risk(s) and benefit(s) of the alternative treatment(s) or procedure(s); and the risk(s) and benefit(s) of doing nothing. The patient  was provided information about the general risks and possible complications associated with the procedure. These may include, but are not limited to: failure to achieve desired goals, infection, bleeding, organ or nerve damage, allergic reactions, paralysis, and death. In addition, the patient was informed of those risks and complications associated to Spine-related procedures, such as failure to decrease pain; infection (i.e.: Meningitis, epidural or intraspinal abscess); bleeding (i.e.: epidural hematoma, subarachnoid hemorrhage, or any other type of intraspinal or peri-dural bleeding); organ or nerve damage (i.e.: Any type of peripheral nerve, nerve root, or spinal cord injury) with subsequent damage to sensory, motor, and/or autonomic systems, resulting in permanent pain, numbness, and/or weakness of one or several areas of the body; allergic reactions; (i.e.: anaphylactic reaction); and/or death. Furthermore, the patient was informed of those risks and complications associated with the medications. These include, but are not limited to: allergic reactions (i.e.: anaphylactic or anaphylactoid reaction(s)); adrenal axis suppression; blood sugar elevation that in diabetics may result in ketoacidosis or comma; water retention that in patients with history of congestive heart failure may result in shortness of breath, pulmonary edema, and decompensation with resultant heart failure; weight gain; swelling or edema; medication-induced neural toxicity; particulate matter embolism and blood vessel occlusion with resultant organ, and/or nervous system infarction; and/or aseptic necrosis of one or more joints. Finally, the patient was informed that Medicine is not an exact  science; therefore, there is also the possibility of unforeseen or unpredictable risks and/or possible complications that may result in a catastrophic outcome. The patient indicated having understood very clearly. We have given the patient no guarantees and we have made no promises. Enough time was given to the patient to ask questions, all of which were answered to the patient's satisfaction. Ms. Marsiglia has indicated that she wanted to continue with the procedure. Attestation: I, the ordering provider, attest that I have discussed with the patient the benefits, risks, side-effects, alternatives, likelihood of achieving goals, and potential problems during recovery for the procedure that I have provided informed consent. Date  Time: 05/03/2024 10:55 AM  Pre-Procedure Preparation:  Monitoring: As per clinic protocol. Respiration, ETCO2, SpO2, BP, heart rate and rhythm monitor placed and checked for adequate function Safety Precautions: Patient was assessed for positional comfort and pressure points before starting the procedure. Time-out: I initiated and conducted the Time-out before starting the procedure, as per protocol. The patient was asked to participate by confirming the accuracy of the Time Out information. Verification of the correct person, site, and procedure were performed and confirmed by me, the nursing staff, and the patient. Time-out conducted as per Joint Commission's Universal Protocol (UP.01.01.01). Time: 1221 Start Time: 1221 hrs.  Description/Narrative of Procedure:          Target: The 6 o'clock position under the pedicle, on the affected side. Region: Posterolateral Lumbosacral Approach: Posterior Percutaneous Paravertebral approach.  Rationale (medical necessity): procedure needed and proper for the diagnosis and/or treatment of the patient's medical symptoms and needs. Procedural Technique Safety Precautions: Aspiration looking for blood return was conducted prior to  all injections. At no point did we inject any substances, as a needle was being advanced. No attempts were made at seeking any paresthesias. Safe injection practices and needle disposal techniques used. Medications properly checked for expiration dates. SDV (single dose vial) medications used. Description of the Procedure: Protocol guidelines were followed. The patient was placed in position over the procedure table. The target area was identified and the area prepped in the usual manner. Skin &  deeper tissues infiltrated with local anesthetic. Appropriate amount of time allowed to pass for local anesthetics to take effect. The procedure needles were then advanced to the target area. Proper needle placement secured. Negative aspiration confirmed. Solution injected in intermittent fashion, asking for systemic symptoms every 0.5cc of injectate. The needles were then removed and the area cleansed, making sure to leave some of the prepping solution back to take advantage of its long term bactericidal properties.  5cc solution made of 4cc of 0.2% ropivacaine , 1 cc of Decadron  10 mg/cc.  2.5 cc injected for the left L4 nerve, 2.5 cc injected for the left L5 nerve 5cc solution made of 4cc of 0.2% ropivacaine , 1 cc of Decadron  10 mg/cc.  2.5 cc injected for the right L4 nerve, 2.5 cc injected for the right L5 nerve  Vitals:   05/03/24 1222 05/03/24 1226 05/03/24 1231 05/03/24 1237  BP: (!) 138/94 (!) 141/93 (!) 147/100 (!) 144/94  Pulse:      Resp: 13 12 13  (!) 22  Temp:      SpO2: 95% 97%  100%  Weight:      Height:        Start Time: 1221 hrs. End Time: 1233 hrs.  Imaging Guidance (Spinal):          Type of Imaging Technique: Fluoroscopy Guidance (Spinal) Indication(s): Fluoroscopy guidance for needle placement to enhance accuracy in procedures requiring precise needle localization for targeted delivery of medication in or near specific anatomical locations not easily accessible without such  real-time imaging assistance. Exposure Time: Please see nurses notes. Contrast: Before injecting any contrast, we confirmed that the patient did not have an allergy to iodine, shellfish, or radiological contrast. Once satisfactory needle placement was completed at the desired level, radiological contrast was injected. Contrast injected under live fluoroscopy. No contrast complications. See chart for type and volume of contrast used. Fluoroscopic Guidance: I was personally present during the use of fluoroscopy. Tunnel Vision Technique used to obtain the best possible view of the target area. Parallax error corrected before commencing the procedure. Direction-depth-direction technique used to introduce the needle under continuous pulsed fluoroscopy. Once target was reached, antero-posterior, oblique, and lateral fluoroscopic projection used confirm needle placement in all planes. Images permanently stored in EMR. Interpretation: I personally interpreted the imaging intraoperatively. Adequate needle placement confirmed in multiple planes. Appropriate spread of contrast into desired area was observed. No evidence of afferent or efferent intravascular uptake. No intrathecal or subarachnoid spread observed. Permanent images saved into the patient's record.  Post-operative Assessment:  Post-procedure Vital Signs:  Pulse/HCG Rate: 8887 Temp: (!) 97.5 F (36.4 C) Resp: (!) 22 BP: (!) 144/94 SpO2: 100 %  EBL: None  Complications: No immediate post-treatment complications observed by team, or reported by patient.  Note: The patient tolerated the entire procedure well. A repeat set of vitals were taken after the procedure and the patient was kept under observation following institutional policy, for this type of procedure. Post-procedural neurological assessment was performed, showing return to baseline, prior to discharge. The patient was provided with post-procedure discharge instructions, including a  section on how to identify potential problems. Should any problems arise concerning this procedure, the patient was given instructions to immediately contact us , at any time, without hesitation. In any case, we plan to contact the patient by telephone for a follow-up status report regarding this interventional procedure.  Comments:  No additional relevant information.  Plan of Care (POC)  Orders:  Orders Placed This Encounter  Procedures  DG PAIN CLINIC C-ARM 1-60 MIN NO REPORT    Intraoperative interpretation by procedural physician at [facility].    Standing Status:   Standing    Number of Occurrences:   1    Reason for exam::   Assistance in needle guidance and placement for procedures requiring needle placement in or near specific anatomical locations not easily accessible without such assistance.    Belbuca  150 mcg twice daily    Medications ordered for procedure: Meds ordered this encounter  Medications   iohexol  (OMNIPAQUE ) 180 MG/ML injection 10 mL    Must be Myelogram-compatible. If not available, you may substitute with a water-soluble, non-ionic, hypoallergenic, myelogram-compatible radiological contrast medium.   lidocaine  (XYLOCAINE ) 2 % (with pres) injection 400 mg   sodium chloride  flush (NS) 0.9 % injection 2 mL    This is for a two (2) level block. Use two (2) syringes and divide content in half.   ropivacaine  (PF) 2 mg/mL (0.2%) (NAROPIN ) injection 2 mL    This is for a two (2) level block. Use two (2) syringes and divide content in half.   dexamethasone  (DECADRON ) injection 20 mg    This is for a two (2) level block. Use two (2) syringes and divide content in half.   Medications administered: We administered iohexol , lidocaine , sodium chloride  flush, ropivacaine  (PF) 2 mg/mL (0.2%), and dexamethasone .  See the medical record for exact dosing, route, and time of administration.    Status post L4-L5 ESI #1 on 10/16/2019, repeat.  Left L4 Sprint  peripheral nerve stimulation 02/19/2020; removed 05/08/2020., right L4 Sprint peripheral nerve stimulation 03/13/2020, lead fracture, replaced 04/15/2020, right removed 06/20/2020.  Right axillary peripheral nerve stimulation 07/29/2020.  Bilateral L4, L5 transforaminal ESI 05/03/24     Follow-up plan:   Return in about 6 weeks (around 06/14/2024) for PPE, F2F.     Recent Visits Date Type Provider Dept  04/11/24 Office Visit Marcelino Nurse, MD [facility]  Showing recent visits within past 90 days and meeting all other requirements Today's Visits Date Type Provider Dept  05/03/24 Procedure visit Marcelino Nurse, MD [facility]  Showing today's visits and meeting all other requirements Future Appointments Date Type Provider Dept  07/10/24 Appointment Patel, Seema K, NP [facility]  Showing future appointments within next 90 days and meeting all other requirements   Disposition: Discharge home  Discharge (Date  Time): 05/03/2024; 1246 hrs.   Primary Care Physician: Albina GORMAN Dine, MD Location: [facility] Note by: Nurse Marcelino, MD (TTS technology used. I apologize for any typographical errors that were not detected and corrected.) Date: 05/03/2024; Time: 1:30 PM  Disclaimer:  Medicine is not an visual merchandiser. The only guarantee in medicine is that nothing is guaranteed. It is important to note that the decision to proceed with this intervention was based on the information collected from the patient. The Data and conclusions were drawn from the patient's questionnaire, the interview, and the physical examination. Because the information was provided in large part by the patient, it cannot be guaranteed that it has not been purposely or unconsciously manipulated. Every effort has been made to obtain as much relevant data as possible for this evaluation. It is important to note that the conclusions that lead to this procedure are  derived in large part from the available data. Always take into account that the treatment will also be dependent on availability of resources and existing treatment guidelines, considered by other  Pain Management Practitioners as being common knowledge and practice, at the time of the intervention. For Medico-Legal purposes, it is also important to point out that variation in procedural techniques and pharmacological choices are the acceptable norm. The indications, contraindications, technique, and results of the above procedure should only be interpreted and judged by a Board-Certified Interventional Pain Specialist with extensive familiarity and expertise in the same exact procedure and technique.

## 2024-05-03 NOTE — Patient Instructions (Signed)

## 2024-05-04 ENCOUNTER — Telehealth: Payer: Self-pay | Admitting: *Deleted

## 2024-05-04 NOTE — Telephone Encounter (Signed)
 Post procedure call; voicemail left

## 2024-05-07 DIAGNOSIS — E119 Type 2 diabetes mellitus without complications: Secondary | ICD-10-CM | POA: Diagnosis not present

## 2024-05-07 DIAGNOSIS — G4733 Obstructive sleep apnea (adult) (pediatric): Secondary | ICD-10-CM | POA: Diagnosis not present

## 2024-05-09 ENCOUNTER — Other Ambulatory Visit

## 2024-05-09 DIAGNOSIS — E119 Type 2 diabetes mellitus without complications: Secondary | ICD-10-CM | POA: Diagnosis not present

## 2024-05-09 DIAGNOSIS — E782 Mixed hyperlipidemia: Secondary | ICD-10-CM | POA: Diagnosis not present

## 2024-05-10 ENCOUNTER — Ambulatory Visit: Admitting: Internal Medicine

## 2024-05-10 LAB — LIPID PANEL
Chol/HDL Ratio: 2.4 ratio (ref 0.0–4.4)
Cholesterol, Total: 152 mg/dL (ref 100–199)
HDL: 63 mg/dL (ref >39)
LDL Chol Calc (NIH): 69 mg/dL (ref 0–99)
Triglycerides: 115 mg/dL (ref 0–149)
VLDL Cholesterol Cal: 20 mg/dL (ref 5–40)

## 2024-05-10 LAB — HEMOGLOBIN A1C
Est. average glucose Bld gHb Est-mCnc: 148 mg/dL
Hgb A1c MFr Bld: 6.8 % — ABNORMAL HIGH (ref 4.8–5.6)

## 2024-05-12 ENCOUNTER — Ambulatory Visit: Payer: Self-pay | Admitting: Internal Medicine

## 2024-05-12 ENCOUNTER — Ambulatory Visit: Admitting: Internal Medicine

## 2024-05-12 VITALS — BP 124/78 | HR 93 | Ht 68.0 in | Wt 246.8 lb

## 2024-05-12 DIAGNOSIS — I10 Essential (primary) hypertension: Secondary | ICD-10-CM

## 2024-05-12 DIAGNOSIS — E1165 Type 2 diabetes mellitus with hyperglycemia: Secondary | ICD-10-CM

## 2024-05-12 DIAGNOSIS — E119 Type 2 diabetes mellitus without complications: Secondary | ICD-10-CM

## 2024-05-12 LAB — POC CREATINE & ALBUMIN,URINE
Creatinine, POC: 300 mg/dL
Microalbumin Ur, POC: 150 mg/L

## 2024-05-12 NOTE — Progress Notes (Signed)
 Established Patient Office Visit  Subjective:  Patient ID: Vickie Stevens, female    DOB: [birth year]  Age: 53 y.o. MRN: [id number]  Chief Complaint  Patient presents with   Follow-up    3 month lab results    No new complaints, here for lab review and medication refills. Labs reviewed and notable for well controlled diabetes, A1c higher but still at target, lipids also at targe. Admits to two hypoglycemic episodes and home bg readings have otherwise been at target.     No other concerns at this time.   Past Medical History:  Diagnosis Date   ADHD (attention deficit hyperactivity disorder)    Biceps tendonitis    Bipolar disorder (HCC)    Chronic back pain    Cluster headache    Crohn'Vernestine Brodhead disease (HCC)    DDD (degenerative disc disease), lumbar    Depression    Diabetes mellitus without complication (HCC)    Hypercholesteremia    Hyperkalemia    Hypertension    Lumbar stenosis    Myotonic dystrophy (HCC)    PCOS (polycystic ovarian syndrome)    Rotator cuff tendinitis    Sleep apnea    Tuberous sclerosis (HCC)     Past Surgical History:  Procedure Laterality Date   CATARACT EXTRACTION, BILATERAL     COLONOSCOPY WITH PROPOFOL  N/A 06/08/2018   Procedure: COLONOSCOPY WITH PROPOFOL ;  Surgeon: Gaylyn Gladis PENNER, MD;  Location: [facility] ENDOSCOPY;  Service: Endoscopy;  Laterality: N/A;   ENDOMETRIAL ABLATION  01-2011   ESOPHAGOGASTRODUODENOSCOPY (EGD) WITH PROPOFOL  N/A 06/08/2018   Procedure: ESOPHAGOGASTRODUODENOSCOPY (EGD) WITH PROPOFOL ;  Surgeon: Gaylyn Gladis PENNER, MD;  Location: [facility] ENDOSCOPY;  Service: Endoscopy;  Laterality: N/A;   ESOPHAGOGASTRODUODENOSCOPY (EGD) WITH PROPOFOL  N/A 05/12/2023   Procedure: ESOPHAGOGASTRODUODENOSCOPY (EGD) WITH PROPOFOL ;  Surgeon: Toledo, Ladell POUR, MD;  Location: [facility] ENDOSCOPY;  Service: Gastroenterology;  Laterality: N/A;   SCS 8/ 2021     SHOULDER SURGERY Left 07-1999, 07-2011   x 4    Social History   Socioeconomic History    Marital status: Divorced    Spouse name: Not on file   Number of children: Not on file   Years of education: Not on file   Highest education level: Not on file  Occupational History   Not on file  Tobacco Use   Smoking status: Never   Smokeless tobacco: Never  Vaping Use   Vaping status: Never Used  Substance and Sexual Activity   Alcohol use: No   Drug use: Never   Sexual activity: Not on file  Other Topics Concern   Not on file  Social History Narrative   Not on file   Social Drivers of Health   Financial Resource Strain: Medium Risk (12/16/2022)   Received from [facility]   Overall Financial Resource Strain (CARDIA)    Difficulty of Paying Living Expenses: Somewhat hard  Food Insecurity: No Food Insecurity (12/16/2022)   Received from [facility]   Hunger Vital Sign    Within the past 12 months, you worried that your food would run out before you got the money to buy more.: Never true    Within the past 12 months, the food you bought just didn't last and you didn't have money to get more.: Never true  Transportation Needs: Unmet Transportation Needs (12/16/2022)   Received from [facility] - Transportation    In the past 12 months, has lack of transportation kept  you from medical appointments or from getting medications?: Yes    Lack of Transportation (Non-Medical): Yes  Physical Activity: Insufficiently Active (04/09/2021)   Received from [facility]   Exercise Vital Sign    On average, how many days per week do you engage in moderate to strenuous exercise (like a brisk walk)?: 1 day    On average, how many minutes do you engage in exercise at this level?: 30 min  Stress: Stress Concern Present (04/09/2021)   Received from [facility] - Occupational Stress Questionnaire    Feeling of Stress : Very much  Social Connections: Unknown  (03/27/2023)   Received from [facility]   Social Network    Social Network: Not on file  Intimate Partner Violence: Not At Risk (03/27/2023)   Received from [facility]   HITS    Over the last 12 months how often did your partner physically hurt you?: Never    Over the last 12 months how often did your partner insult you or talk down to you?: Never    Over the last 12 months how often did your partner threaten you with physical harm?: Never    Over the last 12 months how often did your partner scream or curse at you?: Never    Family History  Problem Relation Age of Onset   Diabetes Father    Hyperlipidemia Father    Hypertension Father    Breast cancer Maternal Aunt     Allergies  Allergen Reactions   Phenergan [Promethazine Hcl] Other (See Comments)    Altered mental status; patient remarked that she ripped out her IV and tore up the hospital room   Codeine Hives   Cortisone Other (See Comments)    Steroid injections may have contributed to cataracts   Lidocaine  Hives    With oral, only   Acetaminophen Rash   Oxycodone Diarrhea and Rash    Pruritic rash, also    Outpatient Medications Prior to Visit  Medication Sig   Accu-Chek FastClix Lancets MISC Use with diabetic device to check sugars twice daily   ACCU-CHEK GUIDE TEST test strip TEST BLOOD SUGAR TWICE DAILY   Alcohol Swabs (DROPSAFE ALCOHOL PREP) 70 % PADS USE AS DIRECTED TWO TIMES DAILY   amLODipine  (NORVASC ) 10 MG tablet Take 1 tablet (10 mg total) by mouth daily.   atorvastatin  (LIPITOR) 20 MG tablet Take 1 tablet (20 mg total) by mouth at bedtime.   Blood Glucose Monitoring Suppl (ACCU-CHEK GUIDE) w/Device KIT Inject 1 Device into the skin See admin instructions.   Buprenorphine  HCl (BELBUCA ) 150 MCG FILM Place 1 Film inside cheek every 12 (twelve) hours.   carvedilol (COREG) 6.25 MG tablet Take 6.25 mg by mouth 2 (two) times daily with a meal.   Dasiglucagon  HCl (ZEGALOGUE ) 0.6 MG/0.6ML SOAJ Inject 0.6 mg  into the skin daily as needed (for blood glucose 55 or less).   Evolocumab  (REPATHA  SURECLICK) 140 MG/ML SOAJ Inject 140 mg into the skin every 14 (fourteen) days.   fluticasone (FLONASE) 50 MCG/ACT nasal spray USE 1 SPRAY NASALLY EVERY DAY   Glucagon  (GVOKE HYPOPEN  2-PACK) 0.5 MG/0.1ML SOAJ Inject 0.5 mg as directed daily.   Lancets Misc. (ACCU-CHEK FASTCLIX LANCET) KIT USE AS DIRECTED   lubiprostone (AMITIZA) 24 MCG capsule Take 24 mcg by mouth 2 (two) times daily.   mexiletine (MEXITIL) 150 MG capsule Take 150 mg by mouth 3 (three) times daily.  QUEtiapine (SEROQUEL) 25 MG tablet Take 25 mg by mouth as needed.   triamcinolone  cream (KENALOG ) 0.1 % APPLY TO AFFECTED AREA AS DIRECTED UP TO TWICE DAILY   valACYclovir  (VALTREX ) 500 MG tablet TAKE 1 TABLET EVERY DAY   Vitamin D , Ergocalciferol , (DRISDOL ) 1.25 MG (50000 UNIT) CAPS capsule Take 1 capsule (50,000 Units total) by mouth once a week.   No facility-administered medications prior to visit.    Review of Systems  Constitutional: Negative.  Negative for weight loss (gained 7 lbs).       Gained weight  HENT: Negative.    Eyes: Negative.   Respiratory: Negative.    Cardiovascular: Negative.   Gastrointestinal: Negative.   Genitourinary: Negative.   Skin: Negative.   Neurological: Negative.   Endo/Heme/Allergies: Negative.        Objective:   BP 124/78   Pulse 93   Ht 5' 8 (1.727 m)   Wt 246 lb 12.8 oz (111.9 kg)   SpO2 98%   BMI 37.53 kg/m   Vitals:   05/12/24 1455  BP: 124/78  Pulse: 93  Height: 5' 8 (1.727 m)  Weight: 246 lb 12.8 oz (111.9 kg)  SpO2: 98%  BMI (Calculated): 37.53    Physical Exam   No results found for any visits on 05/12/24.  Recent Results (from the past 2160 hours)  Hemoglobin A1c     Status: Abnormal   Collection Time: 05/09/24  9:25 AM  Result Value Ref Range   Hgb A1c MFr Bld 6.8 (H) 4.8 - 5.6 %    Comment:          Prediabetes: 5.7 - 6.4          Diabetes: >6.4           Glycemic control for adults with diabetes: <7.0    Est. average glucose Bld gHb Est-mCnc 148 mg/dL  Lipid panel     Status: None   Collection Time: 05/09/24  9:25 AM  Result Value Ref Range   Cholesterol, Total 152 100 - 199 mg/dL   Triglycerides 884 0 - 149 mg/dL   HDL 63 >60 mg/dL   VLDL Cholesterol Cal 20 5 - 40 mg/dL   LDL Chol Calc (NIH) 69 0 - 99 mg/dL   Chol/HDL Ratio 2.4 0.0 - 4.4 ratio    Comment:                                   T. Chol/HDL Ratio                                             Men  Women                               1/2 Avg.Risk  3.4    3.3                                   Avg.Risk  5.0    4.4                                2X Avg.Risk  9.6  7.1                                3X Avg.Risk 23.4   11.0       Assessment & Plan:  Vickie Stevens was seen today for follow-up.  Primary hypertension -     Comprehensive metabolic panel with GFR; Future -     Comprehensive metabolic panel with GFR  Type 2 diabetes mellitus without complication, without long-term current use of insulin  (HCC) -     POC CREATINE & ALBUMIN,URINE -     Lipid panel -     Hemoglobin A1c    Problem List Items Addressed This Visit       Endocrine   Type 2 diabetes mellitus (HCC)   Relevant Orders   POC CREATINE & ALBUMIN,URINE   Lipid panel   Hemoglobin A1c   Other Visit Diagnoses       Primary hypertension    -  Primary   Relevant Orders   Comprehensive metabolic panel with GFR   Comprehensive metabolic panel with GFR       Return in about 3 months (around 08/12/2024) for awv with labs prior.   Total time spent: 20 minutes. This time includes review of previous notes and results and patient face to face interaction during today'Cresencia Asmus visit.    Sherrill Cinderella Perry, MD  05/12/2024   This document may have been prepared by [facility] Voice Recognition software and as such may include unintentional dictation errors.

## 2024-05-15 ENCOUNTER — Ambulatory Visit: Admitting: Internal Medicine

## 2024-05-15 ENCOUNTER — Ambulatory Visit: Payer: Self-pay | Admitting: Internal Medicine

## 2024-05-15 DIAGNOSIS — E1129 Type 2 diabetes mellitus with other diabetic kidney complication: Secondary | ICD-10-CM

## 2024-05-16 MED ORDER — EMPAGLIFLOZIN 10 MG PO TABS: 10.0000 mg | ORAL_TABLET | Freq: Every day | ORAL | 0 refills | Status: DC

## 2024-06-03 LAB — COMPREHENSIVE METABOLIC PANEL WITH GFR
ALT: 19 IU/L (ref 0–32)
AST: 19 IU/L (ref 0–40)
Albumin: 3.7 g/dL — ABNORMAL LOW (ref 3.8–4.9)
Alkaline Phosphatase: 106 IU/L (ref 49–135)
BUN/Creatinine Ratio: 22 (ref 9–23)
BUN: 14 mg/dL (ref 6–24)
Bilirubin Total: 0.3 mg/dL (ref 0.0–1.2)
CO2: 21 mmol/L (ref 20–29)
Calcium: 9.1 mg/dL (ref 8.7–10.2)
Chloride: 105 mmol/L (ref 96–106)
Creatinine, Ser: 0.63 mg/dL (ref 0.57–1.00)
Globulin, Total: 1.9 g/dL (ref 1.5–4.5)
Glucose: 157 mg/dL — ABNORMAL HIGH (ref 70–99)
Potassium: 5.1 mmol/L (ref 3.5–5.2)
Sodium: 140 mmol/L (ref 134–144)
Total Protein: 5.6 g/dL — ABNORMAL LOW (ref 6.0–8.5)
eGFR: 107 mL/min/1.73 (ref >59)

## 2024-06-03 LAB — SPECIMEN STATUS REPORT

## 2024-06-05 ENCOUNTER — Other Ambulatory Visit: Payer: Self-pay | Admitting: Internal Medicine

## 2024-06-09 DIAGNOSIS — R9089 Other abnormal findings on diagnostic imaging of central nervous system: Secondary | ICD-10-CM | POA: Diagnosis not present

## 2024-06-09 DIAGNOSIS — G7111 Myotonic muscular dystrophy: Secondary | ICD-10-CM | POA: Diagnosis not present

## 2024-07-05 NOTE — Progress Notes (Signed)
 BRETT SOZA                                          MRN: [id number]   07/05/2024   The [facility] Quality Team Specialist reviewed this patient medical record for the purposes of chart review for care gap closure. The following were reviewed: abstraction for care gap closure-controlling blood pressure.    [facility] Quality Team

## 2024-07-06 NOTE — Progress Notes (Signed)
 PROVIDER NOTE: Interpretation of information contained herein should be left to medically-trained personnel. Specific patient instructions are provided elsewhere under Patient Instructions section of medical record. This document was created in part using AI and STT-dictation technology, any transcriptional errors that may result from this process are unintentional.  Patient: Vickie Stevens  Service: E/M   PCP: Albina GORMAN Dine, MD  DOB: [birth year]  DOS: 07/10/2024  Provider: Emmy MARLA Blanch, NP  MRN: [id number]  Delivery: Face-to-face  Specialty: Interventional Pain Management  Type: Established Patient  Setting: Ambulatory outpatient facility  Specialty designation: 09  Referring Prov.: Albina GORMAN Dine, MD  Location: Outpatient office facility       History of present illness (HPI) Vickie Stevens, a 54 y.o. year old female, is here today because of her Lumbar radiculopathy [M54.16]. Vickie Stevens primary complain today is Hip Pain (Left)  Pertinent problems: Vickie Stevens has DDD (degenerative disc disease), cervical; Rotator cuff (capsule) sprain Tear of rotator cuff; Cervical neuritis; Lumbar and sacral arthritis; Bilateral occipital neuralgia; Tear of rotator cuff; DJD of shoulder; Adjustment disorder with mixed anxiety and depressed mood; Depression; Myotonic muscular dystrophy (HCC); Numbness and tingling in left arm; Numbness in right leg; Myotonic dystrophy (HCC); Low back pain; Affections of shoulder region; Chronic pain syndrome; Lumbar radiculopathy; Lumbar facet arthropathy; Spinal stenosis, lumbar region, with neurogenic claudication; and Encounter for long-term opiate analgesic use on their pertinent problem list.  Pain Assessment: Severity of Chronic pain is reported as a 6 /10. Location: Hip Left/Radiates down left leg. Onset: More than a month ago. Quality: Burning, Aching, Throbbing. Timing: Constant. Modifying factor(s): Tens Unit, Pain medication, Water, Red Light  Therapy. Vitals:  height is 5' 8 (1.727 m) and weight is 235 lb (106.6 kg). Her temporal temperature is 97.1 F (36.2 C) (abnormal). Her blood pressure is 144/95 (abnormal) and her pulse is 79. Her respiration is 18 and oxygen saturation is 100%.  BMI: Estimated body mass index is 35.73 kg/m as calculated from the following:   Height as of this encounter: 5' 8 (1.727 m).   Weight as of this encounter: 235 lb (106.6 kg).  Last encounter: 01/10/2024. Last procedure: Visit date not found.  Reason for encounter: both, medication management and post-procedure evaluation and assessment. No change in medical history since last visit.  Patient's pain is at baseline.  Patient continues multimodal pain regimen as prescribed.  States that it provides pain relief and improvement in functional status.   Vickie Stevens underwent a diagnostic/therapeutic lumbar transforaminal epidural steroid injection (L-TFESI) on May 03, 2024.  She reports initially 100% pain relief and functional improvement during local anesthetic phase, followed by sustained 50% pain relief and functional improvement with the left hip and the right leg; however, she continues experiencing burning sensation in her right leg.  Discussed the use of AI scribe software for clinical note transcription with the patient, who gave verbal consent to proceed.  History of Present Illness   Vickie Stevens is a 54 year old female with muscular dystrophy who presents with persistent neuropathic pain and medication management.  She experiences a persistent burning sensation in her right leg, described as feeling like 'hot water is going down my leg all the time.' This sensation has not improved significantly despite previous treatments, including a transforaminal epidural injection which provided about 50% overall relief, with the left hip and the right leg; however, she continues experiencing burning sensation in her right leg.   She has a  history of muscular dystrophy, a neuromuscular disease causing muscle deterioration. These symptoms began in her foot and have progressed over time.  She has tried medications such as Lyrica  and gabapentin in the past for neuropathic pain, but they were not effective. She has been using Belbuca  since 2017 for pain management and reports no side effects or adverse reactions from this medication.  She inquires about obtaining a new TENS unit, as her current one, obtained in 2024, is not functioning as well as before. She prefers a unit with multiple functions, specifically mentioning the Interferential Current (IFC) mode as beneficial for her neck and back pain.  She mentions previous use of a peripheral nerve stimulator in 2021, which provided significant relief for her shoulder pain, eliminating it completely.      Procedure Procedure: Lumbar trans-foraminal epidural steroid injection (L-TFESI)  Laterality: Bilateral (-50)  Level: L4 and L5 nerve root(s) Imaging: Fluoroscopy-guided         Anesthesia: Local anesthesia (1-2% Lidocaine ) DOS: 05/03/2024  Performed by: Wallie Sherry, MD   Purpose: Diagnostic/Therapeutic Indications: Lumbar radicular pain severe enough to impact quality of life or function. 1. Lumbar radiculopathy   2. Spinal stenosis, lumbar region, with neurogenic claudication   3. Chronic pain syndrome     NAS-11 Pain score:        Pre-procedure: 6 /10        Post-procedure: 2 /10   Post-Procedure Evaluation    Effectiveness:  Initial hour after procedure: 100 % . Subsequent 4-6 hours post-procedure: 100% . Analgesia past initial 6 hours: 50 % . Ongoing improvement:  Analgesic:  50 % . Function: Vickie Stevens reports improvement in function ROM: Vickie Stevens reports improvement in ROM Interpretation: Vickie Stevens underwent a diagnostic/therapeutic lumbar transforaminal epidural steroid injection (L-TFESI) on May 03, 2024.  She reports initially 100% pain relief and  functional improvement during local anesthetic phase, followed by sustained 50% pain relief and functional improvement with the left hip and the right leg; however, she continues experiencing burning sensation in her right leg.  Pharmacotherapy Assessment   Belbuca  150 mcg film 1 film inside check every 12 hours as needed for pain. Monitoring: [place] PMP: PDMP reviewed during this encounter.       Pharmacotherapy: No side-effects or adverse reactions reported. Compliance: No problems identified. Effectiveness: Clinically acceptable.  Erlene Doyal SAUN[place], [place]  07/10/2024 10:11 AM  Sign when Signing Visit Nursing Pain Medication Assessment:  Safety precautions to be maintained throughout the outpatient stay will include: orient to surroundings, keep bed in low position, maintain call bell within reach at all times, provide assistance with transfer out of bed and ambulation.  Medication Inspection Compliance: Pill count conducted under aseptic conditions, in front of the patient. Neither the pills nor the bottle was removed from the patient's sight at any time. Once count was completed pills were immediately returned to the patient in their original bottle.  Medication: Belbuca  150mcg Pill/Patch Count: 46 of 60 pills/patches remain Pill/Patch Appearance: Markings consistent with prescribed medication Bottle Appearance: Standard pharmacy container. Clearly labeled. Filled Date: 31 / 48 / 2025 Last Medication intake:  Today    UDS:  Summary  Date Value Ref Range Status  03/05/2022 Note  Final    Comment:    ==================================================================== [employer] Select 13 (MW) ==================================================================== Test                             Result  Flag       Units  Drug Present and Declared for Prescription Verification   Buprenorphine                   4            EXPECTED   ng/mg creat   Norbuprenorphine               14            EXPECTED   ng/mg creat    Source of buprenorphine  is a scheduled prescription medication.    Norbuprenorphine is an expected metabolite of buprenorphine .  ==================================================================== Test                      Result    Flag   Units      Ref Range   Creatinine              190              mg/dL      >=79 ==================================================================== Declared Medications:  The flagging and interpretation on this report are based on the  following declared medications.  Unexpected results may arise from  inaccuracies in the declared medications.   **Note: The testing scope of this panel does not include small to  moderate amounts of these reported medications:   Buprenorphine  (Belbuca )   **Note: The testing scope of this panel does not include the  following reported medications:   Amlodipine  (Norvasc )  Atorvastatin  (Lipitor) ==================================================================== For clinical consultation, please call [phone]. ====================================================================     No results found for: CBDTHCR No results found for: D8THCCBX No results found for: D9THCCBX  ROS  Constitutional: Denies any fever or chills Gastrointestinal: No reported hemesis, hematochezia, vomiting, or acute GI distress Musculoskeletal:  Hip Pain (Left) Neurological: No reported episodes of acute onset apraxia, aphasia, dysarthria, agnosia, amnesia, paralysis, loss of coordination, or loss of consciousness  Medication Review  Accu-Chek FastClix Lancet, Accu-Chek FastClix Lancets, Accu-Chek Guide, Buprenorphine  HCl, Dasiglucagon  HCl, DropSafe Alcohol Prep, Evolocumab , Glucagon , QUEtiapine, amLODipine , atorvastatin , carvedilol, empagliflozin , fluticasone, glucose blood, lubiprostone, mexiletine, nystatin cream, triamcinolone  cream, and valACYclovir   History Review  Allergy: Vickie Stevens is  allergic to phenergan [promethazine hcl], codeine, cortisone, lidocaine , acetaminophen, and oxycodone. Drug: Vickie Stevens  reports no history of drug use. Alcohol:  reports no history of alcohol use. Tobacco:  reports that she has never smoked. She has never used smokeless tobacco. Social: Vickie Stevens  reports that she has never smoked. She has never used smokeless tobacco. She reports that she does not drink alcohol and does not use drugs. Medical:  has a past medical history of ADHD (attention deficit hyperactivity disorder), Biceps tendonitis, Bipolar disorder (HCC), Chronic back pain, Cluster headache, Crohn's disease (HCC), DDD (degenerative disc disease), lumbar, Depression, Diabetes mellitus without complication (HCC), Hypercholesteremia, Hyperkalemia, Hypertension, Lumbar stenosis, Myotonic dystrophy (HCC), PCOS (polycystic ovarian syndrome), Rotator cuff tendinitis, Sleep apnea, and Tuberous sclerosis (HCC). Surgical: Vickie Stevens  has a past surgical history that includes Endometrial ablation (01-2011); Shoulder surgery (Left, 07-1999, 07-2011); Cataract extraction, bilateral; Esophagogastroduodenoscopy (egd) with propofol  (N/A, 06/08/2018); Colonoscopy with propofol  (N/A, 06/08/2018); SCS 8/ 2021; and Esophagogastroduodenoscopy (egd) with propofol  (N/A, 05/12/2023). Family: family history includes Breast cancer in her maternal aunt; Diabetes in her father; Hyperlipidemia in her father; Hypertension in her father.  Laboratory Chemistry Profile   Renal Lab Results  Component Value Date   BUN 14 05/09/2024   CREATININE 0.63 05/09/2024  BCR 22 05/09/2024   GFRAA >60 12/08/2017   GFRNONAA >60 12/08/2017    Hepatic Lab Results  Component Value Date   AST 19 05/09/2024   ALT 19 05/09/2024   ALBUMIN 3.7 (L) 05/09/2024   ALKPHOS 106 05/09/2024    Electrolytes Lab Results  Component Value Date   NA 140 05/09/2024   K 5.1 05/09/2024   CL 105 05/09/2024   CALCIUM  9.1 05/09/2024    Bone No  results found for: VD25OH, VD125OH2TOT, CI6874NY7, CI7874NY7, 25OHVITD1, 25OHVITD2, 25OHVITD3, TESTOFREE, TESTOSTERONE  Inflammation (CRP: Acute Phase) (ESR: Chronic Phase) No results found for: CRP, ESRSEDRATE, LATICACIDVEN       Note: Above Lab results reviewed.  Recent Imaging Review  DG PAIN CLINIC C-ARM 1-60 MIN NO REPORT Fluoro was used, but no Radiologist interpretation will be provided.  Please refer to NOTES tab for provider progress note. Note: Reviewed        Physical Exam  Vitals: BP (!) 144/95 (BP Location: Left Arm, Patient Position: Sitting, Cuff Size: Normal)   Pulse 79   Temp (!) 97.1 F (36.2 C) (Temporal)   Resp 18   Ht 5' 8 (1.727 m)   Wt 235 lb (106.6 kg)   SpO2 100%   BMI 35.73 kg/m  BMI: Estimated body mass index is 35.73 kg/m as calculated from the following:   Height as of this encounter: 5' 8 (1.727 m).   Weight as of this encounter: 235 lb (106.6 kg). Ideal: Ideal body weight: 63.9 kg (140 lb 14 oz) Adjusted ideal body weight: 81 kg (178 lb 8.4 oz) General appearance: Well nourished, well developed, and well hydrated. In no apparent acute distress Mental status: Alert, oriented x 3 (person, place, & time)       Respiratory: No evidence of acute respiratory distress Eyes: PERLA  Musculoskeletal: Left hip pain Assessment   Diagnosis Status  1. Lumbar radiculopathy   2. Spinal stenosis, lumbar region, with neurogenic claudication   3. Medication management   4. Chronic pain syndrome   5. Myotonic muscular dystrophy (HCC)   6. Pompe disease (HCC)   7. Cervicalgia   8. Cervical radicular pain    Improved Improved Controlled   Updated Problems: Problem  Medication Management    Plan of Care  Problem-specific:  Assessment and Plan    Lumbar radiculopathy Chronic lumbar radiculopathy with persistent right leg and foot burning sensation. Previous transforaminal epidural steroid injection provided partial  relief. Right leg burning likely neuropathic.  - Scheduled virtual visit with Dr. Marcelino in two weeks to discuss peripheral nerve stimulator (PNS).  Myotonic muscular dystrophy with neuropathic pain Myotonic muscular dystrophy causing neuropathic pain in the right leg. Previous Lyrica  and gabapentin trials ineffective. Burning sensation consistent with neuropathic pain from neuromuscular disease. - Continue current management as previous medications were ineffective.  Medication management Chronic pain syndrome managed with Belbuca , well-tolerated without side effects. Insurance requires prior authorization for Belbuca . - Submitted prior authorization for Belbuca . - Sent prescription to [facility], [place]. Patient's pain is controlled with Belbuca , we will continue on current medication regimen.  Prescribing drug monitoring (PDMP) reviewed, findings consistent with the use of prescribed medication and no evidence of narcotic misuse or abuse. Routine UDS ordered today.  No side effects or adverse reaction reported to medication.  Schedule follow-up in 72-month for medication management with Miko Markwood, NP.  Cervicalgia Chronic cervicalgia. Current unit less effective. Interested in newer model. Previous IFC use beneficial for neck and back pain. - Ordered new  TENS unit for neck and back pain management. - Scheduled virtual visit with Dr. Marcelino to discuss  peripheral nerve stimulator (PNS discussion)      Vickie Stevens has a current medication list which includes the following long-term medication(s): amlodipine , atorvastatin , carvedilol, fluticasone, and mexiletine.  Pharmacotherapy (Medications Ordered): Meds ordered this encounter  Medications   Buprenorphine  HCl (BELBUCA ) 150 MCG FILM    Sig: Place 1 Film inside cheek every 12 (twelve) hours.    Dispense:  60 each    Refill:  5    For chronic pain syndrome   Orders:  Orders Placed This Encounter  Procedures   [employer]  Select 13 (MW), Urine    Volume: 30 ml(s). Minimum 3 ml of urine is needed. Document temperature of fresh sample. Indications: Long term (current) use of opiate analgesic (S20.108)    Release to patient:   Immediate   Tens Unit    Duration: 18 to 24 months  Treatment Area: Back DX Codes: M54.16    Scheduling Instructions:     Please complete and fax a Prescription & Letter of Medical Necessity for an E-Stim Zynex NexWave + Monthly Supplies. Arrange for patient to meet with company representative for instructions on use.        Return in about 2 weeks (around 07/24/2024) for (VV), eval by MD (Wants to talk about PNS) Dr. Marcelino .    Recent Visits Date Type Provider Dept  05/03/24 Procedure visit Marcelino Nurse, MD [facility]  04/11/24 Office Visit Marcelino Nurse, MD [facility]  Showing recent visits within past 90 days and meeting all other requirements Today's Visits Date Type Provider Dept  07/10/24 Office Visit Tynslee Bowlds K, NP [facility]  Showing today's visits and meeting all other requirements Future Appointments Date Type Provider Dept  07/24/24 Appointment Marcelino Nurse, MD [facility]  Showing future appointments within next 90 days and meeting all other requirements  I discussed the assessment and treatment plan with the patient. The patient was provided an opportunity to ask questions and all were answered. The patient agreed with the plan and demonstrated an understanding of the instructions.  Patient advised to call back or seek an in-person evaluation if the symptoms or condition worsens.  I personally spent a total of 30 minutes in the care of the patient today including preparing to see the patient, getting/reviewing separately obtained history, performing a medically appropriate exam/evaluation, counseling and educating, placing orders, referring and communicating with other health care professionals, documenting  clinical information in the EHR, independently interpreting results, communicating results, and coordinating care.   Note by: Amayrani Bennick K Clayvon Parlett, NP (TTS and AI technology used. I apologize for any typographical errors that were not detected and corrected.) Date: 07/10/2024; Time: 11:58 AM

## 2024-07-10 ENCOUNTER — Encounter: Payer: Self-pay | Admitting: Cardiovascular Disease

## 2024-07-10 ENCOUNTER — Ambulatory Visit (INDEPENDENT_AMBULATORY_CARE_PROVIDER_SITE_OTHER): Admitting: Cardiovascular Disease

## 2024-07-10 ENCOUNTER — Ambulatory Visit: Attending: Nurse Practitioner | Admitting: Nurse Practitioner

## 2024-07-10 ENCOUNTER — Encounter: Payer: Self-pay | Admitting: Nurse Practitioner

## 2024-07-10 VITALS — BP 144/95 | HR 79 | Temp 97.1°F | Resp 18 | Ht 68.0 in | Wt 235.0 lb

## 2024-07-10 VITALS — BP 126/82 | HR 71 | Ht 68.0 in | Wt 238.6 lb

## 2024-07-10 DIAGNOSIS — G894 Chronic pain syndrome: Secondary | ICD-10-CM | POA: Insufficient documentation

## 2024-07-10 DIAGNOSIS — I340 Nonrheumatic mitral (valve) insufficiency: Secondary | ICD-10-CM | POA: Diagnosis not present

## 2024-07-10 DIAGNOSIS — G7111 Myotonic muscular dystrophy: Secondary | ICD-10-CM | POA: Diagnosis present

## 2024-07-10 DIAGNOSIS — R0602 Shortness of breath: Secondary | ICD-10-CM | POA: Diagnosis not present

## 2024-07-10 DIAGNOSIS — Z79899 Other long term (current) drug therapy: Secondary | ICD-10-CM | POA: Diagnosis not present

## 2024-07-10 DIAGNOSIS — E7402 Pompe disease: Secondary | ICD-10-CM | POA: Diagnosis not present

## 2024-07-10 DIAGNOSIS — M48062 Spinal stenosis, lumbar region with neurogenic claudication: Secondary | ICD-10-CM | POA: Insufficient documentation

## 2024-07-10 DIAGNOSIS — M5416 Radiculopathy, lumbar region: Secondary | ICD-10-CM | POA: Diagnosis not present

## 2024-07-10 DIAGNOSIS — E782 Mixed hyperlipidemia: Secondary | ICD-10-CM | POA: Diagnosis not present

## 2024-07-10 DIAGNOSIS — M5412 Radiculopathy, cervical region: Secondary | ICD-10-CM | POA: Insufficient documentation

## 2024-07-10 DIAGNOSIS — E119 Type 2 diabetes mellitus without complications: Secondary | ICD-10-CM

## 2024-07-10 DIAGNOSIS — M542 Cervicalgia: Secondary | ICD-10-CM | POA: Diagnosis not present

## 2024-07-10 DIAGNOSIS — R0789 Other chest pain: Secondary | ICD-10-CM

## 2024-07-10 DIAGNOSIS — I10 Essential (primary) hypertension: Secondary | ICD-10-CM

## 2024-07-10 MED ORDER — BELBUCA 150 MCG BU FILM: 1.0000 | ORAL_FILM | Freq: Two times a day (BID) | BUCCAL | 5 refills | Status: AC

## 2024-07-10 NOTE — Progress Notes (Signed)
 "     Cardiology Office Note   Date:  07/10/2024   ID:  Vickie Stevens, DOB Dec 30, 1970, MRN [id number]  PCP:  Vickie GORMAN Dine, MD  Cardiologist:  Vickie Bathe, MD      History of Present Illness: Vickie Stevens is a 54 y.o. female who presents for  Chief Complaint  Patient presents with   Follow-up    6 month follow up    Had coreg changed from 12.5 to 25 bid.      Past Medical History:  Diagnosis Date   ADHD (attention deficit hyperactivity disorder)    Biceps tendonitis    Bipolar disorder (HCC)    Chronic back pain    Cluster headache    Crohn's disease (HCC)    DDD (degenerative disc disease), lumbar    Depression    Diabetes mellitus without complication (HCC)    Hypercholesteremia    Hyperkalemia    Hypertension    Lumbar stenosis    Myotonic dystrophy (HCC)    PCOS (polycystic ovarian syndrome)    Rotator cuff tendinitis    Sleep apnea    Tuberous sclerosis (HCC)      Past Surgical History:  Procedure Laterality Date   CATARACT EXTRACTION, BILATERAL     COLONOSCOPY WITH PROPOFOL  N/A 06/08/2018   Procedure: COLONOSCOPY WITH PROPOFOL ;  Surgeon: Vickie Gladis PENNER, MD;  Location: [facility] ENDOSCOPY;  Service: Endoscopy;  Laterality: N/A;   ENDOMETRIAL ABLATION  01-2011   ESOPHAGOGASTRODUODENOSCOPY (EGD) WITH PROPOFOL  N/A 06/08/2018   Procedure: ESOPHAGOGASTRODUODENOSCOPY (EGD) WITH PROPOFOL ;  Surgeon: Vickie Gladis PENNER, MD;  Location: [facility] ENDOSCOPY;  Service: Endoscopy;  Laterality: N/A;   ESOPHAGOGASTRODUODENOSCOPY (EGD) WITH PROPOFOL  N/A 05/12/2023   Procedure: ESOPHAGOGASTRODUODENOSCOPY (EGD) WITH PROPOFOL ;  Surgeon: Toledo, Vickie POUR, MD;  Location: [facility] ENDOSCOPY;  Service: Gastroenterology;  Laterality: N/A;   SCS 8/ 2021     SHOULDER SURGERY Left 07-1999, 07-2011   x 4     Current Outpatient Medications  Medication Sig Dispense Refill   Accu-Chek FastClix Lancets MISC Use with diabetic device to check sugars twice daily 200 each 3   ACCU-CHEK  GUIDE TEST test strip TEST BLOOD SUGAR TWICE DAILY 200 strip 3   Alcohol Swabs (DROPSAFE ALCOHOL PREP) 70 % PADS USE AS DIRECTED TWO TIMES DAILY 200 each 3   amLODipine  (NORVASC ) 10 MG tablet Take 1 tablet (10 mg total) by mouth daily. 30 tablet 11   atorvastatin  (LIPITOR) 20 MG tablet Take 1 tablet (20 mg total) by mouth at bedtime. (Patient taking differently: Take 20 mg by mouth at bedtime. Only takes when unable to receive repatha ) 100 tablet 3   Blood Glucose Monitoring Suppl (ACCU-CHEK GUIDE) w/Device KIT Inject 1 Device into the skin See admin instructions. 1 kit 0   Buprenorphine  HCl (BELBUCA ) 150 MCG FILM Place 1 Film inside cheek every 12 (twelve) hours. 60 each 5   carvedilol (COREG) 6.25 MG tablet Take 6.25 mg by mouth 2 (two) times daily with a meal. (Patient taking differently: Take 25 mg by mouth 2 (two) times daily with a meal.)     Dasiglucagon  HCl (ZEGALOGUE ) 0.6 MG/0.6ML SOAJ Inject 0.6 mg into the skin daily as needed (for blood glucose 55 or less). 0.6 mL 5   empagliflozin  (JARDIANCE ) 10 MG TABS tablet Take 1 tablet (10 mg total) by mouth daily before breakfast. 90 tablet 0   Evolocumab  (REPATHA  SURECLICK) 140 MG/ML SOAJ Inject 140 mg into the skin every 14 (fourteen) days. 6 mL 2  fluticasone (FLONASE) 50 MCG/ACT nasal spray USE 1 SPRAY NASALLY EVERY DAY 32 g 3   Glucagon  (GVOKE HYPOPEN  2-PACK) 0.5 MG/0.1ML SOAJ Inject 0.5 mg as directed daily. 0.4 mL 5   Lancets Misc. (ACCU-CHEK FASTCLIX LANCET) KIT USE AS DIRECTED 1 kit 3   lubiprostone (AMITIZA) 24 MCG capsule Take 24 mcg by mouth 2 (two) times daily.     mexiletine (MEXITIL) 150 MG capsule Take 150 mg by mouth 3 (three) times daily.     nystatin cream (MYCOSTATIN) APPLY TO OUTER VAGINAL REGION AND UNDER THE BREASTS EVERY 8 HOURS AS NEEDED 45 g 3   QUEtiapine (SEROQUEL) 25 MG tablet Take 25 mg by mouth as needed.     triamcinolone  cream (KENALOG ) 0.1 % APPLY TO AFFECTED AREA AS DIRECTED UP TO TWICE DAILY 90 g 3    valACYclovir  (VALTREX ) 500 MG tablet TAKE 1 TABLET EVERY DAY 100 tablet 3   No current facility-administered medications for this visit.    Allergies:   Phenergan [promethazine hcl], Codeine, Cortisone, Lidocaine , Acetaminophen, and Oxycodone    Social History:   reports that she has never smoked. She has never used smokeless tobacco. She reports that she does not drink alcohol and does not use drugs.   Family History:  family history includes Breast cancer in her maternal aunt; Diabetes in her father; Hyperlipidemia in her father; Hypertension in her father.    ROS:     Review of Systems  Constitutional: Negative.   HENT: Negative.    Eyes: Negative.   Respiratory: Negative.    Gastrointestinal: Negative.   Genitourinary: Negative.   Musculoskeletal: Negative.   Skin: Negative.   Neurological: Negative.   Endo/Heme/Allergies: Negative.   Psychiatric/Behavioral: Negative.    All other systems reviewed and are negative.     All other systems are reviewed and negative.    PHYSICAL EXAM: VS:  BP 126/82   Pulse 71   Ht 5' 8 (1.727 m)   Wt 238 lb 9.6 oz (108.2 kg)   SpO2 98%   BMI 36.28 kg/m  , BMI Body mass index is 36.28 kg/m. Last weight:  Wt Readings from Last 3 Encounters:  07/10/24 238 lb 9.6 oz (108.2 kg)  07/10/24 235 lb (106.6 kg)  05/12/24 246 lb 12.8 oz (111.9 kg)     Physical Exam Constitutional:      Appearance: Normal appearance.  Cardiovascular:     Rate and Rhythm: Normal rate and regular rhythm.     Heart sounds: Normal heart sounds.  Pulmonary:     Effort: Pulmonary effort is normal.     Breath sounds: Normal breath sounds.  Musculoskeletal:     Right lower leg: No edema.     Left lower leg: No edema.  Neurological:     Mental Status: She is alert.       EKG:   Recent Labs: 05/09/2024: ALT 19; BUN 14; Creatinine, Ser 0.63; Potassium 5.1; Sodium 140    Lipid Panel    Component Value Date/Time   CHOL 152 05/09/2024 0925   TRIG  115 05/09/2024 0925   HDL 63 05/09/2024 0925   CHOLHDL 2.4 05/09/2024 0925   LDLCALC 69 05/09/2024 0925      Other studies Reviewed: Additional studies/ records that were reviewed today include:  Review of the above records demonstrates:       No data to display            ASSESSMENT AND PLAN:    ICD-10-CM  1. Primary hypertension  I10    No further headaches after going to coreg 25 bid    2. Type 2 diabetes mellitus without complication, without long-term current use of insulin  (HCC)  E11.9     3. Mixed hyperlipidemia  E78.2     4. SOB (shortness of breath)  R06.02     5. Nonrheumatic mitral valve regurgitation  I34.0    Trace MR, normal LVEF    6. Chest pain, non-cardiac  R07.89        Problem List Items Addressed This Visit       Endocrine   Type 2 diabetes mellitus (HCC)     Other   Mixed hyperlipidemia   Other Visit Diagnoses       Primary hypertension    -  Primary   No further headaches after going to coreg 25 bid     SOB (shortness of breath)         Nonrheumatic mitral valve regurgitation       Trace MR, normal LVEF     Chest pain, non-cardiac              Disposition:   No follow-ups on file.    Total time spent: 35 minutes  Signed,  Vickie Bathe, MD  07/10/2024 12:04 PM    [facility] "

## 2024-07-10 NOTE — Progress Notes (Signed)
 Nursing Pain Medication Assessment:  Safety precautions to be maintained throughout the outpatient stay will include: orient to surroundings, keep bed in low position, maintain call bell within reach at all times, provide assistance with transfer out of bed and ambulation.  Medication Inspection Compliance: Pill count conducted under aseptic conditions, in front of the patient. Neither the pills nor the bottle was removed from the patient's sight at any time. Once count was completed pills were immediately returned to the patient in their original bottle.  Medication: Belbuca  150mcg Pill/Patch Count: 46 of 60 pills/patches remain Pill/Patch Appearance: Markings consistent with prescribed medication Bottle Appearance: Standard pharmacy container. Clearly labeled. Filled Date: 20 / 64 / 2025 Last Medication intake:  Today

## 2024-07-12 LAB — TOXASSURE SELECT 13 (MW), URINE

## 2024-07-19 ENCOUNTER — Ambulatory Visit (INDEPENDENT_AMBULATORY_CARE_PROVIDER_SITE_OTHER): Admitting: Internal Medicine

## 2024-07-19 ENCOUNTER — Encounter: Payer: Self-pay | Admitting: Internal Medicine

## 2024-07-19 VITALS — BP 124/86 | HR 66 | Temp 98.7°F | Ht 68.0 in | Wt 238.8 lb

## 2024-07-19 DIAGNOSIS — E119 Type 2 diabetes mellitus without complications: Secondary | ICD-10-CM

## 2024-07-19 DIAGNOSIS — I10 Essential (primary) hypertension: Secondary | ICD-10-CM | POA: Diagnosis not present

## 2024-07-19 DIAGNOSIS — G4733 Obstructive sleep apnea (adult) (pediatric): Secondary | ICD-10-CM | POA: Diagnosis not present

## 2024-07-19 DIAGNOSIS — E1165 Type 2 diabetes mellitus with hyperglycemia: Secondary | ICD-10-CM | POA: Diagnosis not present

## 2024-07-19 LAB — POCT CBG (FASTING - GLUCOSE)-MANUAL ENTRY: Glucose Fasting, POC: 128 mg/dL — AB (ref 70–99)

## 2024-07-19 NOTE — Progress Notes (Signed)
 "  Established Patient Office Visit  Subjective:  Patient ID: Vickie Stevens, female    DOB: 1970-09-04  Age: 54 y.o. MRN: [id number]  Chief Complaint  Patient presents with   Follow-up    CPAP compliance     No new complaints, compliant with her cpap machine and benefiting from it'Vickie Stevens use. Requests new equipment as current machine and mask are older than 5 yrs and mask is becoming loose.    No other concerns at this time.   Past Medical History:  Diagnosis Date   ADHD (attention deficit hyperactivity disorder)    Biceps tendonitis    Bipolar disorder (HCC)    Chronic back pain    Cluster headache    Crohn'Vickie Stevens disease (HCC)    DDD (degenerative disc disease), lumbar    Depression    Diabetes mellitus without complication (HCC)    Hypercholesteremia    Hyperkalemia    Hypertension    Lumbar stenosis    Myotonic dystrophy (HCC)    PCOS (polycystic ovarian syndrome)    Rotator cuff tendinitis    Sleep apnea    Tuberous sclerosis (HCC)     Past Surgical History:  Procedure Laterality Date   CATARACT EXTRACTION, BILATERAL     COLONOSCOPY WITH PROPOFOL  N/A 06/08/2018   Procedure: COLONOSCOPY WITH PROPOFOL ;  Surgeon: Vickie Gladis PENNER, MD;  Location: [facility] ENDOSCOPY;  Service: Endoscopy;  Laterality: N/A;   ENDOMETRIAL ABLATION  01-2011   ESOPHAGOGASTRODUODENOSCOPY (EGD) WITH PROPOFOL  N/A 06/08/2018   Procedure: ESOPHAGOGASTRODUODENOSCOPY (EGD) WITH PROPOFOL ;  Surgeon: Vickie Gladis PENNER, MD;  Location: [facility] ENDOSCOPY;  Service: Endoscopy;  Laterality: N/A;   ESOPHAGOGASTRODUODENOSCOPY (EGD) WITH PROPOFOL  N/A 05/12/2023   Procedure: ESOPHAGOGASTRODUODENOSCOPY (EGD) WITH PROPOFOL ;  Surgeon: Toledo, Ladell POUR, MD;  Location: [facility] ENDOSCOPY;  Service: Gastroenterology;  Laterality: N/A;   SCS 8/ 2021     SHOULDER SURGERY Left 07-1999, 07-2011   x 4    Social History   Socioeconomic History   Marital status: Divorced    Spouse name: Not on file   Number of children: Not on  file   Years of education: Not on file   Highest education level: Not on file  Occupational History   Not on file  Tobacco Use   Smoking status: Never   Smokeless tobacco: Never  Vaping Use   Vaping status: Never Used  Substance and Sexual Activity   Alcohol use: No   Drug use: Never   Sexual activity: Not on file  Other Topics Concern   Not on file  Social History Narrative   Not on file   Social Drivers of Health   Tobacco Use: Low Risk (07/19/2024)   Patient History    Smoking Tobacco Use: Never    Smokeless Tobacco Use: Never    Passive Exposure: Not on file  Financial Resource Strain: Medium Risk (12/16/2022)   Received [facility]   Overall Financial Resource Strain (CARDIA)    Difficulty of Paying Living Expenses: Somewhat hard  Food Insecurity: No Food Insecurity (12/16/2022)   Received from [facility]   Epic    Within the past 12 months, you worried that your food would run out before you got the money to buy more.: Never true    Within the past 12 months, the food you bought just didn't last and you didn't have money to get more.: Never true  Transportation Needs: Unmet Transportation Needs (12/16/2022)   Received from [facility]  PRAPARE - Transportation    In the past 12 months, has lack of transportation kept you from medical appointments or from getting medications?: Yes    Lack of Transportation (Non-Medical): Yes  Physical Activity: Not on file  Stress: Not on file  Social Connections: Unknown (03/27/2023)   Received from [facility]   Social Network    Social Network: Not on file  Intimate Partner Violence: Not At Risk (03/27/2023)   Received from [facility]   HITS    Over the last 12 months how often did your partner physically hurt you?: Never    Over the last 12 months how often did your partner insult you or talk down to you?: Never    Over the last 12 months how often did your partner  threaten you with physical harm?: Never    Over the last 12 months how often did your partner scream or curse at you?: Never  Depression (PHQ2-9): Low Risk (07/10/2024)   Depression (PHQ2-9)    PHQ-2 Score: 0  Alcohol Screen: Not on file  Housing: Unknown (10/22/2023)   Received from [facility]   Epic    Unable to Pay for Housing in the Last Year: Not on file    Number of Times Moved in the Last Year: Not on file    At any time in the past 12 months, were you homeless or living in a shelter (including now)?: No  Utilities: Not on file  Health Literacy: Not on file    Family History  Problem Relation Age of Onset   Diabetes Father    Hyperlipidemia Father    Hypertension Father    Breast cancer Maternal Aunt     Allergies[1]  Show/hide medication list[2]  Review of Systems  Constitutional: Negative.  Negative for weight loss (gained 3 lbs).       Gained weight  HENT: Negative.    Eyes: Negative.   Respiratory: Negative.    Cardiovascular: Negative.   Gastrointestinal: Negative.   Genitourinary: Negative.   Skin: Negative.   Neurological: Negative.   Endo/Heme/Allergies: Negative.        Objective:   BP 124/86   Pulse 66   Temp 98.7 F (37.1 C) (Tympanic)   Ht 5' 8 (1.727 m)   Wt 238 lb 12.8 oz (108.3 kg)   SpO2 98%   BMI 36.31 kg/m   Vitals:   07/19/24 1017  BP: 124/86  Pulse: 66  Temp: 98.7 F (37.1 C)  Height: 5' 8 (1.727 m)  Weight: 238 lb 12.8 oz (108.3 kg)  SpO2: 98%  TempSrc: Tympanic  BMI (Calculated): 36.32    Physical Exam Vitals reviewed.  Constitutional:      General: She is not in acute distress.    Appearance: She is obese.  HENT:     Head: Normocephalic.     Nose: Nose normal.     Mouth/Throat:     Mouth: Mucous membranes are moist.  Eyes:     Extraocular Movements: Extraocular movements intact.     Pupils: Pupils are equal, round, and reactive to light.  Cardiovascular:     Rate and Rhythm: Normal rate  and regular rhythm.     Heart sounds: No murmur heard. Pulmonary:     Effort: Pulmonary effort is normal.     Breath sounds: No rhonchi or rales.  Abdominal:     General: Abdomen is flat.     Palpations: There is no hepatomegaly, splenomegaly or mass.  Musculoskeletal:        General: Normal range of motion.     Cervical back: Normal range of motion. No tenderness.  Skin:    General: Skin is warm and dry.  Neurological:     General: No focal deficit present.     Mental Status: She is alert and oriented to person, place, and time.     Cranial Nerves: No cranial nerve deficit.     Motor: No weakness.  Psychiatric:        Mood and Affect: Mood normal.        Behavior: Behavior normal.      Results for orders placed or performed in visit on 07/19/24  POCT CBG (Fasting - Glucose)  Result Value Ref Range   Glucose Fasting, POC 128 (A) 70 - 99 mg/dL        Assessment & Plan:  Vickie Stevens was seen today for follow-up.  Type 2 diabetes mellitus without complication, without long-term current use of insulin  (HCC) -     POCT CBG (Fasting - Glucose)  OSA on CPAP  Primary hypertension   Recommend new cpap equipment. Problem List Items Addressed This Visit       Endocrine   Type 2 diabetes mellitus (HCC) - Primary   Relevant Orders   POCT CBG (Fasting - Glucose) (Completed)   Other Visit Diagnoses       OSA on CPAP         Primary hypertension       Relevant Medications   carvedilol (COREG) 25 MG tablet       Return if symptoms worsen or fail to improve.   Total time spent: 20 minutes. This time includes review of previous notes and results and patient face to face interaction during today'Suyash Amory visit.    Sherrill Cinderella Perry, MD  07/19/2024   This document may have been prepared by [facility] Voice Recognition software and as such may include unintentional dictation errors.     [1]  Allergies Allergen Reactions   Phenergan [Promethazine Hcl] Other (See  Comments)    Altered mental status; patient remarked that she ripped out her IV and tore up the hospital room   Codeine Hives   Cortisone Other (See Comments)    Steroid injections may have contributed to cataracts   Lidocaine  Hives    With oral, only   Acetaminophen Rash   Oxycodone Diarrhea and Rash    Pruritic rash, also  [2]  Outpatient Medications Prior to Visit  Medication Sig   Accu-Chek FastClix Lancets MISC Use with diabetic device to check sugars twice daily   ACCU-CHEK GUIDE TEST test strip TEST BLOOD SUGAR TWICE DAILY   Alcohol Swabs (DROPSAFE ALCOHOL PREP) 70 % PADS USE AS DIRECTED TWO TIMES DAILY   amLODipine  (NORVASC ) 10 MG tablet Take 1 tablet (10 mg total) by mouth daily.   atorvastatin  (LIPITOR) 20 MG tablet Take 1 tablet (20 mg total) by mouth at bedtime. (Patient taking differently: Take 20 mg by mouth at bedtime. Only takes when unable to receive repatha )   Blood Glucose Monitoring Suppl (ACCU-CHEK GUIDE) w/Device KIT Inject 1 Device into the skin See admin instructions.   Buprenorphine  HCl (BELBUCA ) 150 MCG FILM Place 1 Film inside cheek every 12 (twelve) hours.   carvedilol (COREG) 25 MG tablet Take 25 mg by mouth 2 (two) times daily.   Dasiglucagon  HCl (ZEGALOGUE ) 0.6 MG/0.6ML SOAJ Inject 0.6 mg into the skin daily as needed (for blood glucose 55 or less).  empagliflozin  (JARDIANCE ) 10 MG TABS tablet Take 1 tablet (10 mg total) by mouth daily before breakfast.   Evolocumab  (REPATHA  SURECLICK) 140 MG/ML SOAJ Inject 140 mg into the skin every 14 (fourteen) days.   fluticasone (FLONASE) 50 MCG/ACT nasal spray USE 1 SPRAY NASALLY EVERY DAY   Glucagon  (GVOKE HYPOPEN  2-PACK) 0.5 MG/0.1ML SOAJ Inject 0.5 mg as directed daily.   Lancets Misc. (ACCU-CHEK FASTCLIX LANCET) KIT USE AS DIRECTED   lubiprostone (AMITIZA) 24 MCG capsule Take 24 mcg by mouth 2 (two) times daily.   mexiletine (MEXITIL) 150 MG capsule Take 150 mg by mouth 3 (three) times daily.   nystatin cream  (MYCOSTATIN) APPLY TO OUTER VAGINAL REGION AND UNDER THE BREASTS EVERY 8 HOURS AS NEEDED   QUEtiapine (SEROQUEL) 25 MG tablet Take 25 mg by mouth as needed.   triamcinolone  cream (KENALOG ) 0.1 % APPLY TO AFFECTED AREA AS DIRECTED UP TO TWICE DAILY   valACYclovir  (VALTREX ) 500 MG tablet TAKE 1 TABLET EVERY DAY   carvedilol (COREG) 6.25 MG tablet Take 6.25 mg by mouth 2 (two) times daily with a meal. (Patient not taking: Reported on 07/19/2024)   No facility-administered medications prior to visit.   "

## 2024-07-21 ENCOUNTER — Encounter: Payer: Self-pay | Admitting: Student in an Organized Health Care Education/Training Program

## 2024-07-21 MED ORDER — GVOKE HYPOPEN 2-PACK 0.5 MG/0.1ML ~~LOC~~ SOAJ: 0.5000 mg | Freq: Every day | SUBCUTANEOUS | 5 refills | Status: AC

## 2024-07-24 ENCOUNTER — Ambulatory Visit: Admitting: Student in an Organized Health Care Education/Training Program

## 2024-07-24 DIAGNOSIS — G894 Chronic pain syndrome: Secondary | ICD-10-CM | POA: Diagnosis not present

## 2024-07-24 DIAGNOSIS — M48062 Spinal stenosis, lumbar region with neurogenic claudication: Secondary | ICD-10-CM | POA: Diagnosis not present

## 2024-07-24 DIAGNOSIS — M5416 Radiculopathy, lumbar region: Secondary | ICD-10-CM

## 2024-07-24 NOTE — Progress Notes (Signed)
 PROVIDER NOTE: Interpretation of information contained herein should be left to medically-trained personnel. Specific patient instructions are provided elsewhere under Patient Instructions section of medical record. This document was created in part using AI and STT-dictation technology, any transcriptional errors that may result from this process are unintentional.  Patient: Vickie Stevens  Service: E/M   PCP: Albina GORMAN Dine, MD  DOB: [birth year]  DOS: 07/24/2024  Provider: Wallie Sherry, MD  MRN: [id number]  Delivery: Virtual Visit  Specialty: Interventional Pain Management  Type: Established Patient  Setting: Ambulatory outpatient facility  Specialty designation: 09  Referring Prov.: Albina GORMAN Dine, MD  Location: Remote location       Virtual Encounter - Pain Management PROVIDER NOTE: Information contained herein reflects review and annotations entered in association with encounter. Interpretation of such information and data should be left to medically-trained personnel. Information provided to patient can be located elsewhere in the medical record under Patient Instructions. Document created using STT-dictation technology, any transcriptional errors that may result from process are unintentional.    Contact & Pharmacy Preferred: [phone] Home: [phone] (home) Mobile: [phone] (mobile) E-mail: [email or web address]  [id number] [place], [place] - [address] [place] [address] Phone: [phone] Fax: [phone]  [facility] - [place], [place] - [address] [place] [address] Phone: [phone] Fax: [phone]   Pre-screening  Ms. Wichmann offered in-person vs virtual encounter. She indicated preferring virtual for this encounter.   Reason COVID-19*  Social distancing based on CDC and AMA recommendations.   I contacted Vickie Stevens on 07/24/2024 via telephone.      I  clearly identified myself as Wallie Sherry, MD. I verified that I was speaking with the correct person using two identifiers (Name: VIBHA FERDIG, and date of birth: 09/24/1970).  Consent I sought verbal advanced consent from Vickie Stevens for virtual visit interactions. I informed Ms. Gow of possible security and privacy concerns, risks, and limitations associated with providing not-in-person medical evaluation and management services. I also informed Ms. Cercone of the availability of in-person appointments. Finally, I informed her that there would be a charge for the virtual visit and that she could be  personally, fully or partially, financially responsible for it. Ms. Gaylord expressed understanding and agreed to proceed.   Historic Elements   Ms. JONNI OELKERS is a 54 y.o. year old, female patient evaluated today after our last contact on 05/03/2024. Ms. Jenniges  has a past medical history of ADHD (attention deficit hyperactivity disorder), Biceps tendonitis, Bipolar disorder (HCC), Chronic back pain, Cluster headache, Crohn's disease (HCC), DDD (degenerative disc disease), lumbar, Depression, Diabetes mellitus without complication (HCC), Hypercholesteremia, Hyperkalemia, Hypertension, Lumbar stenosis, Myotonic dystrophy (HCC), PCOS (polycystic ovarian syndrome), Rotator cuff tendinitis, Sleep apnea, and Tuberous sclerosis (HCC). She also  has a past surgical history that includes Endometrial ablation (01-2011); Shoulder surgery (Left, 07-1999, 07-2011); Cataract extraction, bilateral; Esophagogastroduodenoscopy (egd) with propofol  (N/A, 06/08/2018); Colonoscopy with propofol  (N/A, 06/08/2018); SCS 8/ 2021; and Esophagogastroduodenoscopy (egd) with propofol  (N/A, 05/12/2023). Ms. Inglis has a current medication list which includes the following prescription(s): accu-chek fastclix lancets, accu-chek guide test, dropsafe alcohol prep, amlodipine , atorvastatin , accu-chek guide, belbuca , zegalogue ,  empagliflozin , repatha  sureclick, fluticasone, accu-chek fastclix lancet, lubiprostone, mexiletine, nystatin cream, quetiapine, triamcinolone  cream, valacyclovir , carvedilol, carvedilol, and gvoke hypopen  2-pack. She  reports that she has never smoked. She has never used smokeless tobacco. She reports that she does not drink alcohol and  does not use drugs. Ms. Satcher is allergic to phenergan [promethazine hcl], codeine, cortisone, lidocaine , acetaminophen, and oxycodone.  BMI: Estimated body mass index is 36.31 kg/m as calculated from the following:   Height as of 07/19/24: 5' 8 (1.727 m).   Weight as of 07/19/24: 238 lb 12.8 oz (108.3 kg). Last encounter: 04/11/2024. Last procedure: 05/03/2024.  HPI  Today, she is being contacted for a post-procedure assessment.   Patient follows up today with increased right greater than left low back pain with radiation into her buttocks and into her lateral thigh usually stopping above her knees.  She previously had a bilateral L4 and L5 transforaminal epidural steroid injection performed 05/03/2024 that did provide her with 50% pain relief for a couple of weeks with the left side responding better than the right.  Given worsening pain that radiates into her hip and down her leg (R>L) we discussed repeating lumbar transforaminal ESI however targeting L3-L4 and L4-L5.   Laboratory Chemistry Profile   Renal Lab Results  Component Value Date   BUN 14 05/09/2024   CREATININE 0.63 05/09/2024   BCR 22 05/09/2024   GFRAA >60 12/08/2017   GFRNONAA >60 12/08/2017    Hepatic Lab Results  Component Value Date   AST 19 05/09/2024   ALT 19 05/09/2024   ALBUMIN 3.7 (L) 05/09/2024   ALKPHOS 106 05/09/2024    Electrolytes Lab Results  Component Value Date   NA 140 05/09/2024   K 5.1 05/09/2024   CL 105 05/09/2024   CALCIUM  9.1 05/09/2024    Bone No results found for: VD25OH, VD125OH2TOT, CI6874NY7, CI7874NY7, 25OHVITD1, 25OHVITD2, 25OHVITD3,  TESTOFREE, TESTOSTERONE  Inflammation (CRP: Acute Phase) (ESR: Chronic Phase) No results found for: CRP, ESRSEDRATE, LATICACIDVEN       Note: Above Lab results reviewed.  Imaging  CLINICAL DATA:  Low back pain, symptoms persist with greater than 6 weeks of treatment. Left hip pain.   EXAM: CT LUMBAR SPINE WITHOUT CONTRAST   TECHNIQUE: Multidetector CT imaging of the lumbar spine was performed without intravenous contrast administration. Multiplanar CT image reconstructions were also generated.   RADIATION DOSE REDUCTION: This exam was performed according to the departmental dose-optimization program which includes automated exposure control, adjustment of the mA and/or kV according to patient size and/or use of iterative reconstruction technique.   COMPARISON:  MRI 07/05/2011   FINDINGS: Segmentation: 5 lumbar type vertebral bodies. L5 has transitional features.   Alignment: Normal   Vertebrae: No fracture or focal bone lesion.   Paraspinal and other soft tissues: Normal   Disc levels: No disc level abnormality from T12-L1 through L2-3.   L3-4: Bulging of the disc. Bilateral facet osteoarthritis with facet and ligamentous hypertrophy. Moderate multifactorial stenosis at this level that could be symptomatic. The facet arthritis could be painful.   L4-5: Bulging of the disc. Facet and ligamentous hypertrophy. Moderate multifactorial stenosis at this level that could be symptomatic. The facet arthritis could be painful. Foraminal narrowing on the left that could focally affect the left L4 nerve.   L5-S1: Transitional level.  No narrowing of the canal or foramina.   No significant sacroiliac disease.   IMPRESSION: 1. L3-4: Bulging of the disc. Bilateral facet osteoarthritis with facet and ligamentous hypertrophy. Moderate multifactorial stenosis at this level that could be symptomatic. The facet arthritis could be painful. 2. L4-5: Bulging of the disc.  Facet and ligamentous hypertrophy. Moderate multifactorial stenosis at this level that could be symptomatic. The facet arthritis could be painful. Foraminal narrowing  on the left that could focally affect the left L4 nerve. 3. L5-S1: Transitional features. No narrowing of the canal or foramina.     Electronically Signed   By: Oneil Officer M.D.   On: 03/15/2023 10:39  Assessment  The primary encounter diagnosis was Lumbar radiculopathy. Diagnoses of Spinal stenosis, lumbar region, with neurogenic claudication and Chronic pain syndrome were also pertinent to this visit.  Plan of Care  1. Lumbar radiculopathy (Primary) - Lumbar Transforaminal Epidural; Future - Lumbar Transforaminal Epidural; Future  2. Spinal stenosis, lumbar region, with neurogenic claudication - Lumbar Transforaminal Epidural; Future - Lumbar Transforaminal Epidural; Future  3. Chronic pain syndrome - Lumbar Transforaminal Epidural; Future - Lumbar Transforaminal Epidural; Future  Repeat lumbar transforaminal epidural steroid injection however target L3 nerve in addition to the L4 nerve bilaterally.  Orders:  Orders Placed This Encounter  Procedures   Lumbar Transforaminal Epidural    Standing Status:   Future    Expiration Date:   10/22/2024    Scheduling Instructions:     Laterality: Bilateral L3 and L4           Sedation:PRN     Timeframe: As soon as schedule allows.    Where will this procedure be performed?:   [facility]   Lumbar Transforaminal Epidural    Standing Status:   Future    Expected Date:   08/02/2024    Expiration Date:   07/24/2025    Scheduling Instructions:     Laterality: Bilateral     Level(s): L3 and L4          Sedation: Patient's choice.     Timeframe: As soon as schedule allows.    Where will this procedure be performed?:   [facility]   Follow-up plan:   Return in about 9 days (around 08/02/2024) for B/L L3 and L4 TF ESI.      Status post L4-L5 ESI #1  on 10/16/2019, repeat.  Left L4 Sprint peripheral nerve stimulation 02/19/2020; removed 05/08/2020., right L4 Sprint peripheral nerve stimulation 03/13/2020, lead fracture, replaced 04/15/2020, right removed 06/20/2020.  Right axillary peripheral nerve stimulation 07/29/2020.  Bilateral L4, L5 transforaminal ESI 05/03/24     Recent Visits Date Type Provider Dept  07/10/24 Office Visit Patel, Seema K, NP [facility]  05/03/24 Procedure visit Marcelino Nurse, MD [facility]  Showing recent visits within past 90 days and meeting all other requirements Today's Visits Date Type Provider Dept  07/24/24 Office Visit Marcelino Nurse, MD [facility]  Showing today's visits and meeting all other requirements Future Appointments No visits were found meeting these conditions. Showing future appointments within next 90 days and meeting all other requirements  I discussed the assessment and treatment plan with the patient. The patient was provided an opportunity to ask questions and all were answered. The patient agreed with the plan and demonstrated an understanding of the instructions.  Patient advised to call back or seek an in-person evaluation if the symptoms or condition worsens.  Duration of encounter: .  Note by: Nurse Marcelino, MD Date: 07/24/2024; Time: 3:39 PM

## 2024-08-05 ENCOUNTER — Other Ambulatory Visit: Payer: Self-pay | Admitting: Internal Medicine

## 2024-08-05 DIAGNOSIS — E1129 Type 2 diabetes mellitus with other diabetic kidney complication: Secondary | ICD-10-CM

## 2024-08-08 ENCOUNTER — Ambulatory Visit: Admitting: Internal Medicine

## 2024-08-08 ENCOUNTER — Encounter: Payer: Self-pay | Admitting: Internal Medicine

## 2024-08-11 ENCOUNTER — Ambulatory Visit: Admitting: Internal Medicine

## 2024-08-29 ENCOUNTER — Ambulatory Visit: Admitting: Internal Medicine

## 2025-01-01 ENCOUNTER — Ambulatory Visit: Admitting: Cardiovascular Disease

## 2025-01-01 ENCOUNTER — Encounter: Admitting: Nurse Practitioner
